# Patient Record
Sex: Female | Born: 1990 | Race: White | Hispanic: No | Smoking: Former smoker
Health system: Southern US, Community
[De-identification: ages and names within clinical notes are randomized; demographics above are authoritative.]

## PROBLEM LIST (undated history)

## (undated) DIAGNOSIS — J45909 Unspecified asthma, uncomplicated: Secondary | ICD-10-CM

## (undated) DIAGNOSIS — R51 Headache: Secondary | ICD-10-CM

## (undated) DIAGNOSIS — K219 Gastro-esophageal reflux disease without esophagitis: Secondary | ICD-10-CM

## (undated) DIAGNOSIS — R519 Headache, unspecified: Secondary | ICD-10-CM

## (undated) DIAGNOSIS — N809 Endometriosis, unspecified: Secondary | ICD-10-CM

## (undated) DIAGNOSIS — Z8719 Personal history of other diseases of the digestive system: Secondary | ICD-10-CM

## (undated) HISTORY — PX: TUBAL LIGATION: SHX77

## (undated) HISTORY — PX: KNEE ARTHROSCOPY: SHX127

## (undated) HISTORY — PX: OTHER SURGICAL HISTORY: SHX169

## (undated) HISTORY — DX: Gastro-esophageal reflux disease without esophagitis: K21.9

---
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
## 2008-03-04 ENCOUNTER — Emergency Department: Payer: Self-pay | Admitting: Emergency Medicine

## 2009-12-17 ENCOUNTER — Ambulatory Visit: Payer: Self-pay | Admitting: Advanced Practice Midwife

## 2010-01-08 ENCOUNTER — Observation Stay: Payer: Self-pay | Admitting: Obstetrics and Gynecology

## 2010-02-28 ENCOUNTER — Observation Stay: Payer: Self-pay

## 2010-03-22 ENCOUNTER — Ambulatory Visit: Payer: Self-pay | Admitting: Advanced Practice Midwife

## 2010-04-15 ENCOUNTER — Encounter: Payer: Self-pay | Admitting: Maternal & Fetal Medicine

## 2010-05-18 ENCOUNTER — Observation Stay: Payer: Self-pay

## 2010-05-21 ENCOUNTER — Inpatient Hospital Stay: Payer: Self-pay

## 2010-06-04 ENCOUNTER — Emergency Department: Payer: Self-pay | Admitting: Emergency Medicine

## 2010-07-15 ENCOUNTER — Ambulatory Visit: Payer: Self-pay | Admitting: Family Medicine

## 2011-06-11 ENCOUNTER — Encounter (HOSPITAL_COMMUNITY): Payer: Self-pay | Admitting: Emergency Medicine

## 2011-06-11 ENCOUNTER — Emergency Department (HOSPITAL_COMMUNITY)
Admission: EM | Admit: 2011-06-11 | Discharge: 2011-06-11 | Disposition: A | Discharge status: Home / Self Care | Payer: Self-pay | Attending: Emergency Medicine | Admitting: Emergency Medicine

## 2011-06-11 ENCOUNTER — Emergency Department (HOSPITAL_COMMUNITY): Payer: Self-pay

## 2011-06-11 DIAGNOSIS — G43909 Migraine, unspecified, not intractable, without status migrainosus: Secondary | ICD-10-CM | POA: Insufficient documentation

## 2011-06-11 DIAGNOSIS — F17200 Nicotine dependence, unspecified, uncomplicated: Secondary | ICD-10-CM | POA: Insufficient documentation

## 2011-06-11 DIAGNOSIS — R110 Nausea: Secondary | ICD-10-CM | POA: Insufficient documentation

## 2011-06-11 MED ORDER — METOCLOPRAMIDE HCL 5 MG/ML IJ SOLN
10.0000 mg | Freq: Once | INTRAMUSCULAR | Status: AC
  Administered 2011-06-11: 10 mg via INTRAVENOUS
  Filled 2011-06-11: qty 2

## 2011-06-11 MED ORDER — DIPHENHYDRAMINE HCL 50 MG/ML IJ SOLN
25.0000 mg | Freq: Once | INTRAMUSCULAR | Status: AC
  Administered 2011-06-11: 25 mg via INTRAVENOUS
  Filled 2011-06-11: qty 1

## 2011-06-11 MED ORDER — KETOROLAC TROMETHAMINE 30 MG/ML IJ SOLN
30.0000 mg | Freq: Once | INTRAMUSCULAR | Status: AC
  Administered 2011-06-11: 30 mg via INTRAVENOUS
  Filled 2011-06-11: qty 1

## 2011-06-11 MED ORDER — DEXAMETHASONE SODIUM PHOSPHATE 10 MG/ML IJ SOLN
10.0000 mg | Freq: Once | INTRAMUSCULAR | Status: AC
  Administered 2011-06-11: 10 mg via INTRAVENOUS
  Filled 2011-06-11: qty 1

## 2011-06-11 MED ORDER — SODIUM CHLORIDE 0.9 % IV BOLUS (SEPSIS)
1000.0000 mL | Freq: Once | INTRAVENOUS | Status: AC
  Administered 2011-06-11: 1000 mL via INTRAVENOUS

## 2011-06-11 NOTE — ED Notes (Signed)
Pt. Discharged to home via w/c with family, NAD noted

## 2011-06-11 NOTE — ED Notes (Signed)
PT. REPORTS HEADACHE WITH DIZZINESS AND NAUSEA FOR 2 WEEKS , DENIES INJURY , NO FEVER OR CHILLS.

## 2011-06-11 NOTE — ED Provider Notes (Signed)
History     CSN: [id number]  Arrival date & time 06/11/11  0015   First MD Initiated Contact with Patient 06/11/11 [phone]      Chief Complaint  Patient presents with  . Headache    (Consider location/radiation/quality/duration/timing/severity/associated sxs/prior treatment) HPI Patient is a 21 year old female who presents today complaining of 2 weeks of headache that she rates as a 10 out of 10. Patient describes this as being focused across her for head and sharp. She says her symptoms have been associated with nausea but no vomiting. She has no fevers or neck pain but does endorse dizziness. She's not had any trauma. Patient was seen earlier today at [facility] for this. She reports she was given an IM shot of morphine and then discharged. She had no improvement of her headache with this. Patient has had headaches like this previously but never this bad. She denies any numbness, tingling, or paresthesias.There are no other associated or modifying factors.  History reviewed. No pertinent past medical history.  Past Surgical History  Procedure Date  . Ankle arthroplasty   . Feet surgery     History reviewed. No pertinent family history.  History  Substance Use Topics  . Smoking status: Current Everyday Smoker  . Smokeless tobacco: Not on file  . Alcohol Use: No    OB History    Grav Para Term Preterm Abortions TAB SAB Ect Mult Living                  Review of Systems  Constitutional: Negative.   Eyes: Negative.   Respiratory: Negative.   Cardiovascular: Negative.   Gastrointestinal: Positive for nausea.  Genitourinary: Negative.   Musculoskeletal: Negative.   Skin: Negative.   Neurological: Positive for dizziness and headaches.  Hematological: Negative.   Psychiatric/Behavioral: Negative.   All other systems reviewed and are negative.    Allergies  Penicillins  Home Medications  No current outpatient prescriptions on file.  BP 102/52  Pulse 53   Temp(Src) 98.3 F (36.8 C) (Oral)  Resp 16  SpO2 100%  LMP 06/03/2011  Physical Exam GEN: Well-developed, well-nourished female in no distress but appears very uncomfortable. She lays with her eyes closed in a dark room. HEENT: Atraumatic, normocephalic. Oropharynx clear without erythema EYES: PERRLA BL, no scleral icterus. NECK: Trachea midline, no meningismus CV: regular rate and rhythm. No murmurs, rubs, or gallops PULM: No respiratory distress.  No crackles, wheezes, or rales. GI: soft, non-tender. No guarding, rebound, or tenderness. + bowel sounds  Neuro: cranial nerves 2-12 intact, no abnormalities of strength or sensation, A and O x 3, no difficulty with finger to nose task bilaterally. No pronator drift. Patient ambulate without difficulty. MSK: Patient moves all 4 extremities symmetrically, no deformity, edema, or injury noted Psych: no abnormality of mood  ED Course  Procedures (including critical care time)  Labs Reviewed - No data to display Ct Head Wo Contrast  06/11/2011  *RADIOLOGY REPORT*  Clinical Data: Headache, dizziness  CT HEAD WITHOUT CONTRAST  Technique:  Contiguous axial images were obtained from the base of the skull through the vertex without contrast.  Comparison: None.  Findings: There is no evidence for acute hemorrhage, hydrocephalus, mass lesion, or abnormal extra-axial fluid collection.  No definite CT evidence for acute infarction.  Partially opacified posterior ethmoid air cells bilaterally.  Otherwise, the visualized paranasal sinuses and mastoid air cells are predominately clear.  IMPRESSION: No acute intracranial abnormality.  Mild opacification of the posterior ethmoid air  cells.  Correlate clinically if concerned for early acute sinusitis.  Original Report Authenticated By: Waneta Martins, M.D.     1. Migraine       MDM  Patient was evaluated and did have symptoms concerning for migraine versus rebound headaches. Given her report of  dizziness and the persistence of the patient's symptoms CT head was performed. This was unremarkable aside from evidence of possible sinusitis. Patient has had this before but says her symptoms are not similar to this. She denied any facial pain or fevers. She denies any congestion as well. Patient was treated with migraine cocktail including Reglan and Benadryl 12 IV fluids. She had mild improvement in her pain at 8/10. Patient was then given Toradol. Her symptoms completely resolved. She was given a dose of Decadron to prevent rebound. Patient was discharged in good condition with instructions to return if she had any other emergent concerns.        Cyndra Numbers, MD 06/11/11 [phone]

## 2011-06-12 ENCOUNTER — Encounter (HOSPITAL_COMMUNITY): Payer: Self-pay | Admitting: Emergency Medicine

## 2011-06-12 ENCOUNTER — Emergency Department (HOSPITAL_COMMUNITY)
Admission: EM | Admit: 2011-06-12 | Discharge: 2011-06-12 | Disposition: A | Discharge status: Home / Self Care | Payer: Self-pay | Attending: Emergency Medicine | Admitting: Emergency Medicine

## 2011-06-12 DIAGNOSIS — H53149 Visual discomfort, unspecified: Secondary | ICD-10-CM | POA: Insufficient documentation

## 2011-06-12 DIAGNOSIS — R110 Nausea: Secondary | ICD-10-CM | POA: Insufficient documentation

## 2011-06-12 DIAGNOSIS — G43909 Migraine, unspecified, not intractable, without status migrainosus: Secondary | ICD-10-CM | POA: Insufficient documentation

## 2011-06-12 DIAGNOSIS — R209 Unspecified disturbances of skin sensation: Secondary | ICD-10-CM | POA: Insufficient documentation

## 2011-06-12 MED ORDER — SUMATRIPTAN SUCCINATE 100 MG PO TABS: 100.0000 mg | ORAL_TABLET | ORAL | Status: DC | PRN

## 2011-06-12 MED ORDER — SUMATRIPTAN SUCCINATE 6 MG/0.5ML ~~LOC~~ SOLN
6.0000 mg | Freq: Once | SUBCUTANEOUS | Status: AC
  Administered 2011-06-12: 6 mg via SUBCUTANEOUS
  Filled 2011-06-12 (×2): qty 0.5

## 2011-06-12 MED ORDER — PREDNISONE 50 MG PO TABS: 50.0000 mg | ORAL_TABLET | Freq: Every day | ORAL | Status: DC

## 2011-06-12 NOTE — ED Notes (Signed)
Pt c/o generalized HA x several days; pt sts was seen here for same several days ago; pt sts N/V and photophobia with pain

## 2011-06-12 NOTE — ED Provider Notes (Signed)
History     CSN: [id number]  Arrival date & time 06/12/11  1153   None     Chief Complaint  Patient presents with  . Headache     HPI Patient seen here yesterday with headache.  Workup at that time included negative CT scan.  Patient was discharged home after treatment and was improved.  Comes back in today for similar symptoms except now she has on again off again numbness to her left arm and left leg.  Patient has no known history of migraines that she is aware of.  She does complain of photophobia, nausea.  Mild noises appear to make the headache worse. History reviewed. No pertinent past medical history.  Past Surgical History  Procedure Date  . Ankle arthroplasty   . Feet surgery     History reviewed. No pertinent family history.  History  Substance Use Topics  . Smoking status: Current Everyday Smoker  . Smokeless tobacco: Not on file  . Alcohol Use: No    OB History    Grav Para Term Preterm Abortions TAB SAB Ect Mult Living                  Review of Systems Review of systems negative except as noted on history of present illness Allergies  Penicillins  Home Medications   Current Outpatient Rx  Name Route Sig Dispense Refill  . PREDNISONE 50 MG PO TABS Oral Take 1 tablet (50 mg total) by mouth daily. 5 tablet 0  . SUMATRIPTAN SUCCINATE 100 MG PO TABS Oral Take 1 tablet (100 mg total) by mouth every 2 (two) hours as needed for migraine. 10 tablet 0    BP 134/93  Pulse 70  Temp(Src) 98.1 F (36.7 C) (Oral)  Resp 16  SpO2 100%  LMP 06/03/2011  Physical Exam Physical Exam GEN: Well-developed, well-nourished female in no distress but appears very uncomfortable. She lays with her eyes closed in a dark room. HEENT: Atraumatic, normocephalic. Oropharynx clear without erythema EYES: PERRLA BL, no scleral icterus. NECK: Trachea midline, no meningismus CV: regular rate and rhythm. No murmurs, rubs, or gallops PULM: No respiratory distress.  No crackles,  wheezes, or rales. GI: soft, non-tender. No guarding, rebound, or tenderness. + bowel sounds  Neuro: cranial nerves 2-12 intact, no abnormalities of strength or sensation, A and O x 3, no difficulty with finger to nose task bilaterally. No pronator drift. Patient ambulate without difficulty. MSK: Patient moves all 4 extremities symmetrically, no deformity, edema, or injury noted Psych: no abnormality of mood ED Course  Procedures (including critical care time)  Labs Reviewed - No data to display Ct Head Wo Contrast  06/11/2011  *RADIOLOGY REPORT*  Clinical Data: Headache, dizziness  CT HEAD WITHOUT CONTRAST  Technique:  Contiguous axial images were obtained from the base of the skull through the vertex without contrast.  Comparison: None.  Findings: There is no evidence for acute hemorrhage, hydrocephalus, mass lesion, or abnormal extra-axial fluid collection.  No definite CT evidence for acute infarction.  Partially opacified posterior ethmoid air cells bilaterally.  Otherwise, the visualized paranasal sinuses and mastoid air cells are predominately clear.  IMPRESSION: No acute intracranial abnormality.  Mild opacification of the posterior ethmoid air cells.  Correlate clinically if concerned for early acute sinusitis.  Original Report Authenticated By: Waneta Martins, M.D.     1. Migraine       MDM  Patient given subcutaneous Imitrex and had significant relief from her headache.  Nelia Shi, MD 06/12/11 [phone]

## 2012-08-11 ENCOUNTER — Encounter (HOSPITAL_COMMUNITY): Payer: Self-pay | Admitting: Emergency Medicine

## 2012-08-11 ENCOUNTER — Emergency Department (HOSPITAL_COMMUNITY)
Admission: EM | Admit: 2012-08-11 | Discharge: 2012-08-12 | Disposition: A | Discharge status: Home / Self Care | Payer: BC Managed Care – PPO | Attending: Emergency Medicine | Admitting: Emergency Medicine

## 2012-08-11 DIAGNOSIS — R05 Cough: Secondary | ICD-10-CM | POA: Insufficient documentation

## 2012-08-11 DIAGNOSIS — R0982 Postnasal drip: Secondary | ICD-10-CM | POA: Insufficient documentation

## 2012-08-11 DIAGNOSIS — J069 Acute upper respiratory infection, unspecified: Secondary | ICD-10-CM | POA: Insufficient documentation

## 2012-08-11 DIAGNOSIS — R059 Cough, unspecified: Secondary | ICD-10-CM | POA: Insufficient documentation

## 2012-08-11 DIAGNOSIS — J9801 Acute bronchospasm: Secondary | ICD-10-CM | POA: Insufficient documentation

## 2012-08-11 DIAGNOSIS — IMO0001 Reserved for inherently not codable concepts without codable children: Secondary | ICD-10-CM | POA: Insufficient documentation

## 2012-08-11 DIAGNOSIS — F17200 Nicotine dependence, unspecified, uncomplicated: Secondary | ICD-10-CM | POA: Insufficient documentation

## 2012-08-11 DIAGNOSIS — R509 Fever, unspecified: Secondary | ICD-10-CM | POA: Insufficient documentation

## 2012-08-11 MED ORDER — DIPHENHYDRAMINE HCL 25 MG PO CAPS
25.0000 mg | ORAL_CAPSULE | Freq: Once | ORAL | Status: AC
  Administered 2012-08-12: 25 mg via ORAL
  Filled 2012-08-11: qty 1

## 2012-08-11 MED ORDER — ALBUTEROL SULFATE HFA 108 (90 BASE) MCG/ACT IN AERS
2.0000 | INHALATION_SPRAY | RESPIRATORY_TRACT | Status: DC | PRN
  Administered 2012-08-12: 2 via RESPIRATORY_TRACT
  Filled 2012-08-11: qty 6.7

## 2012-08-11 NOTE — ED Notes (Signed)
PT. REPORTS PRODUCTIVE COUGH , FEVER , CORYZA , CHILLS/BODY ACHES ONSET 4 DAYS AGO. STATES 5 1/2 MONTHS PREGNANT.

## 2012-08-11 NOTE — ED Provider Notes (Signed)
History     CSN: [id number]  Arrival date & time 08/11/12  2032   First MD Initiated Contact with Patient 08/11/12 2343      Chief Complaint  Patient presents with  . Nasal Congestion  . Cough    (Consider location/radiation/quality/duration/timing/severity/associated sxs/prior treatment) HPI Comments: PAtient is 5 months pregnant now with URI symptoms and cough  Low grade temp to 100 Has not sen her OB, nor taken any medications  Has had to use inhaler in the past for bronchospasm   Patient is a 22 y.o. female presenting with cough. The history is provided by the patient.  Cough Cough characteristics:  Non-productive and barking Severity:  Moderate Onset quality:  Gradual Timing:  Intermittent Chronicity:  New Smoker: yes   Relieved by:  None tried Worsened by:  Nothing tried Ineffective treatments:  None tried Associated symptoms: myalgias and rhinorrhea   Associated symptoms: no chills, no fever, no headaches, no shortness of breath and no wheezing     History reviewed. No pertinent past medical history.  Past Surgical History  Procedure Laterality Date  . Ankle arthroplasty    . Feet surgery      No family history on file.  History  Substance Use Topics  . Smoking status: Current Every Day Smoker  . Smokeless tobacco: Not on file  . Alcohol Use: No    OB History   Grav Para Term Preterm Abortions TAB SAB Ect Mult Living   1               Review of Systems  Constitutional: Negative for fever and chills.  HENT: Positive for rhinorrhea and postnasal drip.   Respiratory: Positive for cough. Negative for shortness of breath and wheezing.   Gastrointestinal: Negative for nausea and vomiting.  Genitourinary: Negative for dysuria.  Musculoskeletal: Positive for myalgias.  Skin: Negative for pallor.  Neurological: Negative for dizziness and headaches.  All other systems reviewed and are negative.    Allergies  Penicillins  Home Medications   Current  Outpatient Rx  Name  Route  Sig  Dispense  Refill  . acetaminophen (TYLENOL) 325 MG tablet   Oral   Take 650 mg by mouth every 6 (six) hours as needed for pain or fever.         Marland Kitchen EXPIRED: SUMAtriptan (IMITREX) 100 MG tablet   Oral   Take 1 tablet (100 mg total) by mouth every 2 (two) hours as needed for migraine.   10 tablet   0     BP 122/73  Pulse 101  Temp(Src) 99.8 F (37.7 C) (Oral)  Resp 14  SpO2 100%  Physical Exam  Constitutional: She appears well-developed.  HENT:  Head: Normocephalic.  Mouth/Throat: Oropharynx is clear and moist.  Eyes: Pupils are equal, round, and reactive to light.  Neck: Normal range of motion.  Cardiovascular: Regular rhythm.  Tachycardia present.   Pulmonary/Chest: Effort normal and breath sounds normal. No respiratory distress. She has no wheezes. She exhibits tenderness.  Abdominal: Soft.  Musculoskeletal: Normal range of motion.  Neurological: She is alert.  Skin: Skin is warm.    ED Course  Procedures (including critical care time)  Labs Reviewed - No data to display No results found.   1. URI, acute   2. Bronchospasm       MDM   fetal heart tones 160        Arman Filter, NP 08/12/12 [phone]

## 2012-08-12 NOTE — ED Notes (Signed)
Strong fetal heart rate 160 below umbilicus

## 2012-08-14 NOTE — ED Provider Notes (Signed)
Medical screening examination/treatment/procedure(s) were performed by non-physician practitioner and as supervising physician I was immediately available for consultation/collaboration.    Celene Kras, MD 08/14/12 [phone]

## 2012-11-10 ENCOUNTER — Observation Stay: Payer: Self-pay

## 2012-12-02 ENCOUNTER — Inpatient Hospital Stay: Payer: Self-pay | Admitting: Obstetrics and Gynecology

## 2012-12-02 LAB — CBC WITH DIFFERENTIAL/PLATELET
Basophil #: 0.1 10*3/uL (ref 0.0–0.1)
Eosinophil #: 0.4 10*3/uL (ref 0.0–0.7)
HCT: 33.8 % — ABNORMAL LOW (ref 35.0–47.0)
HGB: 11.9 g/dL — ABNORMAL LOW (ref 12.0–16.0)
Lymphocyte #: 2.2 10*3/uL (ref 1.0–3.6)
MCH: 30.6 pg (ref 26.0–34.0)
Monocyte #: 0.7 x10 3/mm (ref 0.2–0.9)
Monocyte %: 5.5 %
Platelet: 317 10*3/uL (ref 150–440)
RBC: 3.87 10*6/uL (ref 3.80–5.20)
WBC: 12.1 10*3/uL — ABNORMAL HIGH (ref 3.6–11.0)

## 2012-12-03 LAB — GC/CHLAMYDIA PROBE AMP

## 2012-12-04 LAB — HEMATOCRIT: HCT: 32.3 % — ABNORMAL LOW (ref 35.0–47.0)

## 2012-12-07 LAB — PATHOLOGY REPORT

## 2013-05-08 ENCOUNTER — Emergency Department (HOSPITAL_COMMUNITY)
Admission: EM | Admit: 2013-05-08 | Discharge: 2013-05-08 | Disposition: A | Discharge status: Home / Self Care | Payer: Worker's Compensation | Attending: Emergency Medicine | Admitting: Emergency Medicine

## 2013-05-08 ENCOUNTER — Encounter (HOSPITAL_COMMUNITY): Payer: Self-pay | Admitting: Emergency Medicine

## 2013-05-08 DIAGNOSIS — Z880 Allergy status to penicillin: Secondary | ICD-10-CM | POA: Insufficient documentation

## 2013-05-08 DIAGNOSIS — M5416 Radiculopathy, lumbar region: Secondary | ICD-10-CM

## 2013-05-08 DIAGNOSIS — F17200 Nicotine dependence, unspecified, uncomplicated: Secondary | ICD-10-CM | POA: Insufficient documentation

## 2013-05-08 DIAGNOSIS — IMO0002 Reserved for concepts with insufficient information to code with codable children: Secondary | ICD-10-CM | POA: Insufficient documentation

## 2013-05-08 DIAGNOSIS — M545 Low back pain, unspecified: Secondary | ICD-10-CM | POA: Insufficient documentation

## 2013-05-08 MED ORDER — CYCLOBENZAPRINE HCL 10 MG PO TABS
10.0000 mg | ORAL_TABLET | Freq: Once | ORAL | Status: AC
  Administered 2013-05-08: 10 mg via ORAL
  Filled 2013-05-08: qty 1

## 2013-05-08 MED ORDER — CYCLOBENZAPRINE HCL 10 MG PO TABS: 10.0000 mg | ORAL_TABLET | Freq: Three times a day (TID) | ORAL | Status: DC | PRN

## 2013-05-08 MED ORDER — HYDROCODONE-ACETAMINOPHEN 5-325 MG PO TABS: 1.0000 | ORAL_TABLET | ORAL | Status: DC | PRN

## 2013-05-08 MED ORDER — IBUPROFEN 800 MG PO TABS: 800.0000 mg | ORAL_TABLET | Freq: Three times a day (TID) | ORAL | Status: DC | PRN

## 2013-05-08 NOTE — ED Notes (Signed)
Pt reports injury to lower back at work this morning.

## 2013-05-08 NOTE — Discharge Instructions (Signed)
Read the information below.  Use the prescribed medication as directed.  Please discuss all new medications with your pharmacist.  Do not take additional tylenol while taking the prescribed pain medication to avoid overdose.  You may return to the Emergency Department at any time for worsening condition or any new symptoms that concern you.   If you develop fevers, loss of control of bowel or bladder, weakness or numbness in your legs, or are unable to walk, return to the ER for a recheck.  ° ° °Back Pain, Adult °Low back pain is very common. About 1 in 5 people have back pain. The cause of low back pain is rarely dangerous. The pain often gets better over time. About half of people with a sudden onset of back pain feel better in just 2 weeks. About 8 in 10 people feel better by 6 weeks.  °CAUSES °Some common causes of back pain include: °· Strain of the muscles or ligaments supporting the spine. °· Wear and tear (degeneration) of the spinal discs. °· Arthritis. °· Direct injury to the back. °DIAGNOSIS °Most of the time, the direct cause of low back pain is not known. However, back pain can be treated effectively even when the exact cause of the pain is unknown. Answering your caregiver's questions about your overall health and symptoms is one of the most accurate ways to make sure the cause of your pain is not dangerous. If your caregiver needs more information, he or she may order lab work or imaging tests (X-rays or MRIs). However, even if imaging tests show changes in your back, this usually does not require surgery. °HOME CARE INSTRUCTIONS °For many people, back pain returns. Since low back pain is rarely dangerous, it is often a condition that people can learn to manage on their own.  °· Remain active. It is stressful on the back to sit or stand in one place. Do not sit, drive, or stand in one place for more than 30 minutes at a time. Take short walks on level surfaces as soon as pain allows. Try to increase  the length of time you walk each day. °· Do not stay in bed. Resting more than 1 or 2 days can delay your recovery. °· Do not avoid exercise or work. Your body is made to move. It is not dangerous to be active, even though your back may hurt. Your back will likely heal faster if you return to being active before your pain is gone. °· Pay attention to your body when you  bend and lift. Many people have less discomfort when lifting if they bend their knees, keep the load close to their bodies, and avoid twisting. Often, the most comfortable positions are those that put less stress on your recovering back. °· Find a comfortable position to sleep. Use a firm mattress and lie on your side with your knees slightly bent. If you lie on your back, put a pillow under your knees. °· Only take over-the-counter or prescription medicines as directed by your caregiver. Over-the-counter medicines to reduce pain and inflammation are often the most helpful. Your caregiver may prescribe muscle relaxant drugs. These medicines help dull your pain so you can more quickly return to your normal activities and healthy exercise. °· Put ice on the injured area. °· Put ice in a plastic bag. °· Place a towel between your skin and the bag. °· Leave the ice on for 15-20 minutes, 03-04 times a day for the first 2 to 3 days. After that, ice and heat may be alternated to reduce pain and spasms. °·   Ask your caregiver about trying back exercises and gentle massage. This may be of some benefit. °· Avoid feeling anxious or stressed. Stress increases muscle tension and can worsen back pain. It is important to recognize when you are anxious or stressed and learn ways to manage it. Exercise is a great option. °SEEK MEDICAL CARE IF: °· You have pain that is not relieved with rest or medicine. °· You have pain that does not improve in 1 week. °· You have new symptoms. °· You are generally not feeling well. °SEEK IMMEDIATE MEDICAL CARE IF:  °· You have pain  that radiates from your back into your legs. °· You develop new bowel or bladder control problems. °· You have unusual weakness or numbness in your arms or legs. °· You develop nausea or vomiting. °· You develop abdominal pain. °· You feel faint. °Document Released: 04/21/2005 Document Revised: 10/21/2011 Document Reviewed: 09/09/2010 °[employer]® Patient Information ©2014 [employer]. ° °

## 2013-05-08 NOTE — ED Provider Notes (Signed)
CSN: [id number]     Arrival date & time 05/08/13  2055 History  This chart was scribed for non-physician practitioner, Trixie Dredge, PA-C,working with Doug Sou, MD, by Karle Plumber, ED Scribe.  This patient was seen in room TR09C/TR09C and the patient's care was started at 10:21 PM.  Chief Complaint  Patient presents with  . Back Pain   The history is provided by the patient. No language interpreter was used.   HPI Comments:  Melissa Mcmahon is a 23 y.o. female who presents to the Emergency Department complaining of lower back pain secondary to injuring it at work earlier this morning. She states she was lifting a 50 pound box over her head when she started feeling the pain. Pt reports the pain is across her back and radiates down her right leg half way down her thigh. She reports the pain as 4/10 at rest and 8/10 with movement. She denies weakness, numbness, numbness in groin, fever, vomiting, abdominal pain, urinary problem, vaginal discharge or bleeding, diarrhea, hematochezia, or constipation. She reports her LMP was approximately one month ago. She has been ambulatory since the incident. She states she does not have a PCP.   History reviewed. No pertinent past medical history. Past Surgical History  Procedure Laterality Date  . Ankle arthroplasty    . Feet surgery     No family history on file. History  Substance Use Topics  . Smoking status: Current Every Day Smoker  . Smokeless tobacco: Not on file  . Alcohol Use: No   OB History   Grav Para Term Preterm Abortions TAB SAB Ect Mult Living   1              Review of Systems  Constitutional: Negative for fever.  Gastrointestinal: Negative for nausea, vomiting, abdominal pain, diarrhea and blood in stool.  Genitourinary: Negative for dysuria, urgency, frequency, vaginal bleeding, vaginal discharge and menstrual problem.  Musculoskeletal: Positive for back pain. Negative for gait problem.  Neurological: Negative for  weakness and numbness.    Allergies  Penicillins  Home Medications   Current Outpatient Rx  Name  Route  Sig  Dispense  Refill  . acetaminophen (TYLENOL) 325 MG tablet   Oral   Take 650 mg by mouth every 6 (six) hours as needed for pain or fever.         Marland Kitchen EXPIRED: SUMAtriptan (IMITREX) 100 MG tablet   Oral   Take 1 tablet (100 mg total) by mouth every 2 (two) hours as needed for migraine.   10 tablet   0    Triage Vitals: BP 125/77  Pulse 84  Temp(Src) 98.2 F (36.8 C) (Oral)  Resp 16  SpO2 100%  LMP 04/08/2013  Breastfeeding? Unknown Physical Exam  Nursing note and vitals reviewed. Constitutional: She appears well-developed and well-nourished. No distress.  HENT:  Head: Normocephalic and atraumatic.  Neck: Neck supple.  Pulmonary/Chest: Effort normal.  Musculoskeletal: She exhibits tenderness.       Arms: Lower extremities:  Strength 5/5, sensation intact, distal pulses intact.    Straight leg raise negative  Neurological: She is alert.  Skin: She is not diaphoretic.  Lower extremities:  Strength 5/5, sensation intact, distal pulses intact.    ED Course  Procedures (including critical care time) DIAGNOSTIC STUDIES: Oxygen Saturation is 100% on RA, normal by my interpretation.   COORDINATION OF CARE: 10:29 PM- Will give pt resource guide of primary care providers. Will prescribe pain medications and provide work note.  Will give pain medication prior to discharge. Pt verbalizes understanding and agrees to plan.  Medications - No data to display  Labs Review Labs Reviewed - No data to display Imaging Review No results found.  EKG Interpretation   None       MDM   1. Low back pain   2. Lumbar radiculopathy    Pt with injury to lower back while performing heavy lifting at work.  Pain is located across lower back and radiates down right leg.  Neurovascularly intact.  No fall, no direct trauma to the spine.  Tenderness to palpation over lumbar area,  worse on the left; however, right leg with radicular symptoms.   No need for emergent imaging at this time.  Pt does need to follow up with PCP or occupational health.  Discussed findings, treatment, and follow up  with patient.  Pt given return precautions.  Pt verbalizes understanding and agrees with plan.      I personally performed the services described in this documentation, which was scribed in my presence. The recorded information has been reviewed and is accurate.     Trixie Dredgemily Vanette Noguchi, PA-C 05/08/13 2316

## 2013-05-09 NOTE — ED Provider Notes (Signed)
Medical screening examination/treatment/procedure(s) were performed by non-physician practitioner and as supervising physician I was immediately available for consultation/collaboration.  EKG Interpretation   None        Jesus Poplin, MD 05/09/13 0026 

## 2013-08-23 ENCOUNTER — Emergency Department (HOSPITAL_COMMUNITY): Payer: BC Managed Care – PPO

## 2013-08-23 ENCOUNTER — Emergency Department (HOSPITAL_COMMUNITY)
Admission: EM | Admit: 2013-08-23 | Discharge: 2013-08-23 | Disposition: A | Discharge status: Home / Self Care | Payer: Self-pay | Attending: Emergency Medicine | Admitting: Emergency Medicine

## 2013-08-23 ENCOUNTER — Encounter (HOSPITAL_COMMUNITY): Payer: Self-pay | Admitting: Emergency Medicine

## 2013-08-23 DIAGNOSIS — Z880 Allergy status to penicillin: Secondary | ICD-10-CM | POA: Insufficient documentation

## 2013-08-23 DIAGNOSIS — Z3202 Encounter for pregnancy test, result negative: Secondary | ICD-10-CM | POA: Insufficient documentation

## 2013-08-23 DIAGNOSIS — M549 Dorsalgia, unspecified: Secondary | ICD-10-CM

## 2013-08-23 DIAGNOSIS — M545 Low back pain, unspecified: Secondary | ICD-10-CM | POA: Insufficient documentation

## 2013-08-23 DIAGNOSIS — R109 Unspecified abdominal pain: Secondary | ICD-10-CM | POA: Insufficient documentation

## 2013-08-23 DIAGNOSIS — F17200 Nicotine dependence, unspecified, uncomplicated: Secondary | ICD-10-CM | POA: Insufficient documentation

## 2013-08-23 DIAGNOSIS — R110 Nausea: Secondary | ICD-10-CM | POA: Insufficient documentation

## 2013-08-23 LAB — URINALYSIS, ROUTINE W REFLEX MICROSCOPIC
Bilirubin Urine: NEGATIVE
Glucose, UA: NEGATIVE mg/dL
Ketones, ur: 15 mg/dL — AB
Nitrite: NEGATIVE
Protein, ur: 30 mg/dL — AB
Specific Gravity, Urine: 1.028 (ref 1.005–1.030)
Urobilinogen, UA: 1 mg/dL (ref 0.0–1.0)
pH: 7.5 (ref 5.0–8.0)

## 2013-08-23 LAB — BASIC METABOLIC PANEL
BUN: 15 mg/dL (ref 6–23)
CALCIUM: 9.2 mg/dL (ref 8.4–10.5)
CHLORIDE: 106 meq/L (ref 96–112)
CO2: 25 meq/L (ref 19–32)
CREATININE: 0.93 mg/dL (ref 0.50–1.10)
GFR calc Af Amer: >90 mL/min (ref >90)
GFR calc non Af Amer: 85 mL/min — ABNORMAL LOW (ref >90)
GLUCOSE: 79 mg/dL (ref 70–99)
Potassium: 3.9 mEq/L (ref 3.7–5.3)
Sodium: 141 mEq/L (ref 137–147)

## 2013-08-23 LAB — URINE MICROSCOPIC-ADD ON

## 2013-08-23 LAB — CBC WITH DIFFERENTIAL/PLATELET
Basophils Absolute: 0.1 10*3/uL (ref 0.0–0.1)
Basophils Relative: 1 % (ref 0–1)
EOS PCT: 8 % — AB (ref 0–5)
Eosinophils Absolute: 0.6 10*3/uL (ref 0.0–0.7)
HEMATOCRIT: 40.4 % (ref 36.0–46.0)
HEMOGLOBIN: 13.6 g/dL (ref 12.0–15.0)
LYMPHS ABS: 2.3 10*3/uL (ref 0.7–4.0)
LYMPHS PCT: 30 % (ref 12–46)
MCH: 30.2 pg (ref 26.0–34.0)
MCHC: 33.7 g/dL (ref 30.0–36.0)
MCV: 89.8 fL (ref 78.0–100.0)
MONO ABS: 0.7 10*3/uL (ref 0.1–1.0)
Monocytes Relative: 9 % (ref 3–12)
NEUTROS ABS: 4 10*3/uL (ref 1.7–7.7)
Neutrophils Relative %: 52 % (ref 43–77)
Platelets: 253 10*3/uL (ref 150–400)
RBC: 4.5 MIL/uL (ref 3.87–5.11)
RDW: 12.8 % (ref 11.5–15.5)
WBC: 7.7 10*3/uL (ref 4.0–10.5)

## 2013-08-23 LAB — POC URINE PREG, ED: Preg Test, Ur: NEGATIVE

## 2013-08-23 MED ORDER — CYCLOBENZAPRINE HCL 10 MG PO TABS: 10.0000 mg | ORAL_TABLET | Freq: Two times a day (BID) | ORAL | Status: DC | PRN

## 2013-08-23 MED ORDER — MECLIZINE HCL 25 MG PO TABS
25.0000 mg | ORAL_TABLET | Freq: Once | ORAL | Status: DC
  Filled 2013-08-23: qty 1

## 2013-08-23 MED ORDER — MORPHINE SULFATE 4 MG/ML IJ SOLN
4.0000 mg | Freq: Once | INTRAMUSCULAR | Status: AC
  Administered 2013-08-23: 4 mg via INTRAVENOUS
  Filled 2013-08-23: qty 1

## 2013-08-23 MED ORDER — SODIUM CHLORIDE 0.9 % IV BOLUS (SEPSIS)
1000.0000 mL | Freq: Once | INTRAVENOUS | Status: AC
  Administered 2013-08-23: 1000 mL via INTRAVENOUS

## 2013-08-23 MED ORDER — ONDANSETRON HCL 4 MG/2ML IJ SOLN
4.0000 mg | Freq: Once | INTRAMUSCULAR | Status: AC
  Administered 2013-08-23: 4 mg via INTRAVENOUS
  Filled 2013-08-23: qty 2

## 2013-08-23 MED ORDER — ONDANSETRON HCL 4 MG PO TABS: 4.0000 mg | ORAL_TABLET | Freq: Four times a day (QID) | ORAL | Status: DC

## 2013-08-23 MED ORDER — KETOROLAC TROMETHAMINE 30 MG/ML IJ SOLN
30.0000 mg | Freq: Once | INTRAMUSCULAR | Status: AC
  Administered 2013-08-23: 30 mg via INTRAVENOUS
  Filled 2013-08-23: qty 1

## 2013-08-23 MED ORDER — IOHEXOL 300 MG/ML  SOLN
100.0000 mL | Freq: Once | INTRAMUSCULAR | Status: AC | PRN
  Administered 2013-08-23: 100 mL via INTRAVENOUS

## 2013-08-23 MED ORDER — HYDROCODONE-ACETAMINOPHEN 5-325 MG PO TABS: 1.0000 | ORAL_TABLET | ORAL | Status: DC | PRN

## 2013-08-23 MED ORDER — PREDNISONE 10 MG PO TABS: 40.0000 mg | ORAL_TABLET | Freq: Every day | ORAL | Status: DC

## 2013-08-23 NOTE — ED Notes (Addendum)
Per pt sts right sided lower back, flank , and lower abdominal pain. sts some nausea. Denies urinary symptoms. Pt currently on menstrual cycle

## 2013-08-23 NOTE — ED Notes (Signed)
Patient transported to CT 

## 2013-08-23 NOTE — ED Provider Notes (Signed)
Medical screening examination/treatment/procedure(s) were performed by non-physician practitioner and as supervising physician I was immediately available for consultation/collaboration.   EKG Interpretation None        Gavin PoundMichael Y. Oletta LamasGhim, MD 08/23/13 [id number]

## 2013-08-23 NOTE — ED Provider Notes (Signed)
CSN: [id number]     Arrival date & time 08/23/13  1524 History   First MD Initiated Contact with Patient 08/23/13 1711     Chief Complaint  Patient presents with  . Flank Pain  . Back Pain     (Consider location/radiation/quality/duration/timing/severity/associated sxs/prior Treatment) HPI  Melissa Mcmahon is a 23 y.o.female without any significant PMH presents to the ER with complaints of right low back and abdominal pain that started on Saturday. She has had some associated nausea. She reports having a hx of cyst when she was pregnant. She reports currently being on her menstrual cycle. She denies having any abnormal vaginal discharge or pelvic pain. She denies urinary symptoms. She reports the pain gets worse with movement   History reviewed. No pertinent past medical history. Past Surgical History  Procedure Laterality Date  . Ankle arthroplasty    . Feet surgery     History reviewed. No pertinent family history. History  Substance Use Topics  . Smoking status: Current Every Day Smoker  . Smokeless tobacco: Not on file  . Alcohol Use: No   OB History   Grav Para Term Preterm Abortions TAB SAB Ect Mult Living   1              Review of Systems   Review of Systems  Gen: no weight loss, fevers, chills, night sweats  Eyes: no discharge or drainage, no occular pain or visual changes  Nose: no epistaxis or rhinorrhea  Mouth: no dental pain, no sore throat  Neck: no neck pain  Lungs:No wheezing, coughing or hemoptysis CV: no chest pain, palpitations, dependent edema or orthopnea  Abd: + abdominal pain, flank pain, nausea,  No vomiting, diarrhea GU: no dysuria or gross hematuria  MSK:  No muscle weakness or pain Neuro: no headache, no focal neurologic deficits  Skin: no rash or wounds Psyche: no complaints    Allergies  Penicillins  Home Medications   Prior to Admission medications   Medication Sig Start Date End Date Taking? Authorizing Provider   cyclobenzaprine (FLEXERIL) 10 MG tablet Take 1 tablet (10 mg total) by mouth 3 (three) times daily as needed for muscle spasms. 05/08/13   Trixie DredgeEmily West, PA-C  HYDROcodone-acetaminophen (NORCO/VICODIN) 5-325 MG per tablet Take 1 tablet by mouth every 4 (four) hours as needed. 05/08/13   Trixie DredgeEmily West, PA-C  ibuprofen (ADVIL,MOTRIN) 800 MG tablet Take 1 tablet (800 mg total) by mouth every 8 (eight) hours as needed for mild pain or moderate pain. 05/08/13   Trixie DredgeEmily West, PA-C   BP 128/86  Pulse 57  Temp(Src) 99.1 F (37.3 C) (Oral)  Resp 20  Wt 177 lb 7 oz (80.485 kg)  SpO2 100%  LMP 08/22/2013 Physical Exam  Nursing note and vitals reviewed. Constitutional: She appears well-developed and well-nourished. No distress.  HENT:  Head: Normocephalic and atraumatic.  Eyes: Pupils are equal, round, and reactive to light.  Neck: Normal range of motion. Neck supple.  Cardiovascular: Normal rate and regular rhythm.   Pulmonary/Chest: Effort normal.  Abdominal: Soft. There is tenderness (mild right flank, suprapubic and RLQ pain). There is rigidity. There is no guarding.  Musculoskeletal:  Pt has equal strength to bilateral lower extremities.  Neurosensory function adequate to both legs Skin color is normal. Skin is warm and moist.  I see no step off deformity, no midline bony tenderness.  Pt is able to ambulate.  No crepitus, laceration, effusion, induration, lesions, swelling.   Pedal pulses are symmetrical and  palpable bilaterally  No tenderness to palpation of lumbar paraspinel muscles   Neurological: She is alert.  Skin: Skin is warm and dry.    ED Course  Procedures (including critical care time) Labs Review Labs Reviewed  URINALYSIS, ROUTINE W REFLEX MICROSCOPIC - Abnormal; Notable for the following:    Color, Urine RED (*)    APPearance TURBID (*)    Hgb urine dipstick LARGE (*)    Ketones, ur 15 (*)    Protein, ur 30 (*)    Leukocytes, UA SMALL (*)    All other components within  normal limits  URINE MICROSCOPIC-ADD ON - Abnormal; Notable for the following:    Squamous Epithelial / LPF MANY (*)    All other components within normal limits  CBC WITH DIFFERENTIAL - Abnormal; Notable for the following:    Eosinophils Relative 8 (*)    All other components within normal limits  BASIC METABOLIC PANEL - Abnormal; Notable for the following:    GFR calc non Af Amer 85 (*)    All other components within normal limits  URINE CULTURE  POC URINE PREG, ED    Imaging Review Ct Abdomen Pelvis W Contrast  08/23/2013   CLINICAL DATA:  Right lower quadrant pain.  EXAM: CT ABDOMEN AND PELVIS WITH CONTRAST  TECHNIQUE: Multidetector CT imaging of the abdomen and pelvis was performed using the standard protocol following bolus administration of intravenous contrast.  CONTRAST:  100 mL OMNIPAQUE IOHEXOL 300 MG/ML  SOLN  COMPARISON:  None.  FINDINGS: The lung bases demonstrate only mild dependent atelectatic change. No pleural or pericardial effusion.  A few tiny stones layering dependently within the gallbladder versus posterior gallbladder wall calcification are seen. Tiny stones favored. No CT evidence of cholecystitis is identified. The liver, spleen, adrenal glands, pancreas and kidneys appear normal. The appendix is well visualized and normal. The stomach and small and large bowel appear normal. No lymphadenopathy or fluid. Uterus, adnexa and urinary bladder are unremarkable. No focal bony abnormality is identified. Small bone island in the right femoral head is incidentally noted.  IMPRESSION: Negative for appendicitis.  No acute finding.  Likely tiny gallstones versus gallbladder wall calcification. No CT evidence of cholecystitis is present.   Electronically Signed   By: Drusilla Kanner M.D.   On: 08/23/2013 19:25     EKG Interpretation None      MDM   Final diagnoses:  Back pain    The patients symptoms appear to be more musculoskeletal except for that she has been having  nausea and some right flank/abdominal pain which is concerning for possible intraabdominal etiology. Urine culture sent out.  The CT scan  Of her abdomen and her blood work is reassuring at this time. She is currently menstruating which is the cause of the hgb in her urine. I suspected musculoskeletal etiology. Will treat with steroids, pain medication, muscle relaxers and nausea medication.  22 y.o.Deborra Phegley Hoyos's evaluation in the Emergency Department is complete. It has been determined that no acute conditions requiring further emergency intervention are present at this time. The patient/guardian have been advised of the diagnosis and plan. We have discussed signs and symptoms that warrant return to the ED, such as changes or worsening in symptoms.  Vital signs are stable at discharge. Filed Vitals:   08/23/13 1930  BP: 128/86  Pulse: 57  Temp:   Resp:     Patient/guardian has voiced understanding and agreed to follow-up with the PCP or specialist.  Dorthula Matasiffany G Taite Baldassari, PA-C 08/23/13 1956

## 2013-08-23 NOTE — Discharge Instructions (Signed)
Lumbosacral Strain Lumbosacral strain is a strain of any of the parts that make up your lumbosacral vertebrae. Your lumbosacral vertebrae are the bones that make up the lower third of your backbone. Your lumbosacral vertebrae are held together by muscles and tough, fibrous tissue (ligaments).  CAUSES  A sudden blow to your back can cause lumbosacral strain. Also, anything that causes an excessive stretch of the muscles in the low back can cause this strain. This is typically seen when people exert themselves strenuously, fall, lift heavy objects, bend, or crouch repeatedly. RISK FACTORS  Physically demanding work.  Participation in pushing or pulling sports or sports that require sudden twist of the back (tennis, golf, baseball).  Weight lifting.  Excessive lower back curvature.  Forward-tilted pelvis.  Weak back or abdominal muscles or both.  Tight hamstrings. SIGNS AND SYMPTOMS  Lumbosacral strain may cause pain in the area of your injury or pain that moves (radiates) down your leg.  DIAGNOSIS Your health care provider can often diagnose lumbosacral strain through a physical exam. In some cases, you may need tests such as X-ray exams.  TREATMENT  Treatment for your lower back injury depends on many factors that your clinician will have to evaluate. However, most treatment will include the use of anti-inflammatory medicines. HOME CARE INSTRUCTIONS   Avoid hard physical activities (tennis, racquetball, waterskiing) if you are not in proper physical condition for it. This may aggravate or create problems.  If you have a back problem, avoid sports requiring sudden body movements. Swimming and walking are generally safer activities.  Maintain good posture.  Maintain a healthy weight.  For acute conditions, you may put ice on the injured area.  Put ice in a plastic bag.  Place a towel between your skin and the bag.  Leave the ice on for 20 minutes, 2 3 times a day.  When the  low back starts healing, stretching and strengthening exercises may be recommended. SEEK MEDICAL CARE IF:  Your back pain is getting worse.  You experience severe back pain not relieved with medicines. SEEK IMMEDIATE MEDICAL CARE IF:   You have numbness, tingling, weakness, or problems with the use of your arms or legs.  There is a change in bowel or bladder control.  You have increasing pain in any area of the body, including your belly (abdomen).  You notice shortness of breath, dizziness, or feel faint.  You feel sick to your stomach (nauseous), are throwing up (vomiting), or become sweaty.  You notice discoloration of your toes or legs, or your feet get very cold. MAKE SURE YOU:   Understand these instructions.  Will watch your condition.  Will get help right away if you are not doing well or get worse. Document Released: 01/29/2005 Document Revised: 02/09/2013 Document Reviewed: 12/08/2012 [employer] Patient Information 2014 [employer].  

## 2013-08-24 LAB — URINE CULTURE
COLONY COUNT: NO GROWTH
Culture: NO GROWTH

## 2014-03-06 ENCOUNTER — Encounter (HOSPITAL_COMMUNITY): Payer: Self-pay | Admitting: Emergency Medicine

## 2014-07-09 ENCOUNTER — Emergency Department (HOSPITAL_COMMUNITY)
Admission: EM | Admit: 2014-07-09 | Discharge: 2014-07-10 | Disposition: A | Discharge status: Home / Self Care | Payer: Self-pay | Attending: Emergency Medicine | Admitting: Emergency Medicine

## 2014-07-09 ENCOUNTER — Encounter (HOSPITAL_COMMUNITY): Payer: Self-pay | Admitting: *Deleted

## 2014-07-09 ENCOUNTER — Emergency Department (HOSPITAL_COMMUNITY): Payer: Self-pay

## 2014-07-09 DIAGNOSIS — Z880 Allergy status to penicillin: Secondary | ICD-10-CM | POA: Insufficient documentation

## 2014-07-09 DIAGNOSIS — R1031 Right lower quadrant pain: Secondary | ICD-10-CM | POA: Insufficient documentation

## 2014-07-09 DIAGNOSIS — Z3202 Encounter for pregnancy test, result negative: Secondary | ICD-10-CM | POA: Insufficient documentation

## 2014-07-09 DIAGNOSIS — R112 Nausea with vomiting, unspecified: Secondary | ICD-10-CM | POA: Insufficient documentation

## 2014-07-09 DIAGNOSIS — Z720 Tobacco use: Secondary | ICD-10-CM | POA: Insufficient documentation

## 2014-07-09 DIAGNOSIS — Z7952 Long term (current) use of systemic steroids: Secondary | ICD-10-CM | POA: Insufficient documentation

## 2014-07-09 DIAGNOSIS — R109 Unspecified abdominal pain: Secondary | ICD-10-CM

## 2014-07-09 DIAGNOSIS — N939 Abnormal uterine and vaginal bleeding, unspecified: Secondary | ICD-10-CM | POA: Insufficient documentation

## 2014-07-09 LAB — POC URINE PREG, ED: Preg Test, Ur: NEGATIVE

## 2014-07-09 LAB — COMPREHENSIVE METABOLIC PANEL
ALBUMIN: 3.8 g/dL (ref 3.5–5.2)
ALT: 37 U/L — ABNORMAL HIGH (ref 0–35)
ANION GAP: 7 (ref 5–15)
AST: 35 U/L (ref 0–37)
Alkaline Phosphatase: 95 U/L (ref 39–117)
BUN: 8 mg/dL (ref 6–23)
CALCIUM: 9.1 mg/dL (ref 8.4–10.5)
CO2: 24 mmol/L (ref 19–32)
Chloride: 108 mmol/L (ref 96–112)
Creatinine, Ser: 0.58 mg/dL (ref 0.50–1.10)
GFR calc Af Amer: >90 mL/min (ref >90)
GLUCOSE: 96 mg/dL (ref 70–99)
Potassium: 4 mmol/L (ref 3.5–5.1)
Sodium: 139 mmol/L (ref 135–145)
Total Bilirubin: 0.2 mg/dL — ABNORMAL LOW (ref 0.3–1.2)
Total Protein: 6.1 g/dL (ref 6.0–8.3)

## 2014-07-09 LAB — URINALYSIS, ROUTINE W REFLEX MICROSCOPIC
Bilirubin Urine: NEGATIVE
Glucose, UA: NEGATIVE mg/dL
Ketones, ur: NEGATIVE mg/dL
LEUKOCYTES UA: NEGATIVE
NITRITE: NEGATIVE
Protein, ur: NEGATIVE mg/dL
Specific Gravity, Urine: 1.026 (ref 1.005–1.030)
UROBILINOGEN UA: 1 mg/dL (ref 0.0–1.0)
pH: 7 (ref 5.0–8.0)

## 2014-07-09 LAB — CBC WITH DIFFERENTIAL/PLATELET
Basophils Absolute: 0.1 10*3/uL (ref 0.0–0.1)
Basophils Relative: 1 % (ref 0–1)
EOS ABS: 0.6 10*3/uL (ref 0.0–0.7)
Eosinophils Relative: 8 % — ABNORMAL HIGH (ref 0–5)
HCT: 39.6 % (ref 36.0–46.0)
Hemoglobin: 13.5 g/dL (ref 12.0–15.0)
LYMPHS ABS: 2.4 10*3/uL (ref 0.7–4.0)
Lymphocytes Relative: 33 % (ref 12–46)
MCH: 29.7 pg (ref 26.0–34.0)
MCHC: 34.1 g/dL (ref 30.0–36.0)
MCV: 87 fL (ref 78.0–100.0)
MONO ABS: 0.6 10*3/uL (ref 0.1–1.0)
Monocytes Relative: 8 % (ref 3–12)
NEUTROS ABS: 3.7 10*3/uL (ref 1.7–7.7)
Neutrophils Relative %: 50 % (ref 43–77)
Platelets: 271 10*3/uL (ref 150–400)
RBC: 4.55 MIL/uL (ref 3.87–5.11)
RDW: 13.1 % (ref 11.5–15.5)
WBC: 7.3 10*3/uL (ref 4.0–10.5)

## 2014-07-09 LAB — URINE MICROSCOPIC-ADD ON

## 2014-07-09 LAB — LIPASE, BLOOD: LIPASE: 27 U/L (ref 11–59)

## 2014-07-09 MED ORDER — IOHEXOL 300 MG/ML  SOLN
25.0000 mL | Freq: Once | INTRAMUSCULAR | Status: AC | PRN
  Administered 2014-07-09: 25 mL via ORAL

## 2014-07-09 MED ORDER — IOHEXOL 300 MG/ML  SOLN
100.0000 mL | Freq: Once | INTRAMUSCULAR | Status: AC | PRN
  Administered 2014-07-09: 100 mL via INTRAVENOUS

## 2014-07-09 NOTE — ED Notes (Signed)
CT notified pt is finished with contrast. 

## 2014-07-09 NOTE — ED Provider Notes (Signed)
CSN: [id number]     Arrival date & time 07/09/14  2058 History   First MD Initiated Contact with Patient 07/09/14 2202     Chief Complaint  Patient presents with  . Abdominal Pain     (Consider location/radiation/quality/duration/timing/severity/associated sxs/prior Treatment) HPI Comments: Patient presents to the ER for evaluation of abdominal pain. Patient has been experiencing pain on the right side of her abdomen, mostly in the lower portion, since earlier this morning. Patient reports that it is a dull aching pain and she has intermittent sharp stabbing pains in the area. She has had nausea and vomiting associated with symptoms. She has not had any diarrhea or constipation. Denies urinary symptoms. She has not had any fever. Patient reports that she is currently menstruating. She has noticed increased pain and cramping with her menstrual cycle in the last few months.  Patient is a 24 y.o. female presenting with abdominal pain.  Abdominal Pain Associated symptoms: nausea, vaginal bleeding and vomiting     History reviewed. No pertinent past medical history. Past Surgical History  Procedure Laterality Date  . Ankle arthroplasty    . Feet surgery     No family history on file. History  Substance Use Topics  . Smoking status: Current Every Day Smoker  . Smokeless tobacco: Not on file  . Alcohol Use: No   OB History    Gravida Para Term Preterm AB TAB SAB Ectopic Multiple Living   1              Review of Systems  Gastrointestinal: Positive for nausea, vomiting and abdominal pain.  Genitourinary: Positive for vaginal bleeding.  All other systems reviewed and are negative.     Allergies  Penicillins  Home Medications   Prior to Admission medications   Medication Sig Start Date End Date Taking? Authorizing Provider  acetaminophen (TYLENOL) 500 MG tablet Take 1,000 mg by mouth every 6 (six) hours as needed for moderate pain.   Yes Historical Provider, MD   cyclobenzaprine (FLEXERIL) 10 MG tablet Take 1 tablet (10 mg total) by mouth 2 (two) times daily as needed for muscle spasms. 08/23/13   Tiffany Irine Seal, PA-C  HYDROcodone-acetaminophen (NORCO/VICODIN) 5-325 MG per tablet Take 1-2 tablets by mouth every 4 (four) hours as needed. 08/23/13   Tiffany Irine Seal, PA-C  ondansetron (ZOFRAN) 4 MG tablet Take 1 tablet (4 mg total) by mouth every 6 (six) hours. 08/23/13   Tiffany Irine Seal, PA-C  predniSONE (DELTASONE) 10 MG tablet Take 4 tablets (40 mg total) by mouth daily. 08/23/13   Tiffany Irine Seal, PA-C   BP 145/96 mmHg  Pulse 61  Temp(Src) 98.8 F (37.1 C)  Resp 19  SpO2 100%  LMP 07/07/2014 Physical Exam  Constitutional: She is oriented to person, place, and time. She appears well-developed and well-nourished. No distress.  HENT:  Head: Normocephalic and atraumatic.  Right Ear: Hearing normal.  Left Ear: Hearing normal.  Nose: Nose normal.  Mouth/Throat: Oropharynx is clear and moist and mucous membranes are normal.  Eyes: Conjunctivae and EOM are normal. Pupils are equal, round, and reactive to light.  Neck: Normal range of motion. Neck supple.  Cardiovascular: Regular rhythm, S1 normal and S2 normal.  Exam reveals no gallop and no friction rub.   No murmur heard. Pulmonary/Chest: Effort normal and breath sounds normal. No respiratory distress. She exhibits no tenderness.  Abdominal: Soft. Normal appearance and bowel sounds are normal. There is no hepatosplenomegaly. There is tenderness in the right  lower quadrant. There is no rebound, no guarding, no tenderness at McBurney's point and negative Murphy's sign. No hernia. Hernia confirmed negative in the right inguinal area and confirmed negative in the left inguinal area.  Genitourinary: Vagina normal and uterus normal. There is no tenderness on the right labia. There is no tenderness on the left labia. Cervix exhibits no motion tenderness, no discharge and no friability. Right adnexum displays  no mass and no tenderness. Left adnexum displays no mass and no tenderness.  Musculoskeletal: Normal range of motion.  Neurological: She is alert and oriented to person, place, and time. She has normal strength. No cranial nerve deficit or sensory deficit. Coordination normal. GCS eye subscore is 4. GCS verbal subscore is 5. GCS motor subscore is 6.  Skin: Skin is warm, dry and intact. No rash noted. No cyanosis.  Psychiatric: She has a normal mood and affect. Her speech is normal and behavior is normal. Thought content normal.  Nursing note and vitals reviewed.   ED Course  Procedures (including critical care time) Labs Review Labs Reviewed  CBC WITH DIFFERENTIAL/PLATELET - Abnormal; Notable for the following:    Eosinophils Relative 8 (*)    All other components within normal limits  COMPREHENSIVE METABOLIC PANEL - Abnormal; Notable for the following:    ALT 37 (*)    Total Bilirubin 0.2 (*)    All other components within normal limits  URINALYSIS, ROUTINE W REFLEX MICROSCOPIC - Abnormal; Notable for the following:    APPearance CLOUDY (*)    Hgb urine dipstick MODERATE (*)    All other components within normal limits  URINE MICROSCOPIC-ADD ON - Abnormal; Notable for the following:    Squamous Epithelial / LPF FEW (*)    Bacteria, UA MANY (*)    All other components within normal limits  WET PREP, GENITAL  LIPASE, BLOOD  RPR  HIV ANTIBODY (ROUTINE TESTING)  POC URINE PREG, ED  GC/CHLAMYDIA PROBE AMP ([place])    Imaging Review Ct Abdomen Pelvis W Contrast  07/09/2014   CLINICAL DATA:  Right upper abdominal pain.  EXAM: CT ABDOMEN AND PELVIS WITH CONTRAST  TECHNIQUE: Multidetector CT imaging of the abdomen and pelvis was performed using the standard protocol following bolus administration of intravenous contrast.  CONTRAST:  25mL OMNIPAQUE IOHEXOL 300 MG/ML SOLN, 100mL OMNIPAQUE IOHEXOL 300 MG/ML SOLN  COMPARISON:  08/23/2013  FINDINGS: Lung bases are clear.  The liver,  spleen, gallbladder, pancreas, adrenal glands, kidneys, abdominal aorta, inferior vena cava, and retroperitoneal lymph nodes are unremarkable. Stomach, small bowel, and colon are not abnormally distended. No discrete wall thickening is appreciated. Stool fills the colon. No free air or free fluid in the abdomen. Scattered mesenteric lymph nodes are not pathologically enlarged.  Pelvis: Appendix is normal. Uterus and ovaries are not enlarged. Bladder wall is not thickened. No free or loculated pelvic fluid collections. No pelvic mass or lymphadenopathy. Mild degenerative changes in the lower thoracic spine. No destructive bone lesions.  IMPRESSION: No acute process demonstrated in the abdomen or pelvis. Probable reactive lymph nodes in the mesentery.   Electronically Signed   By: Burman NievesWilliam  Stevens M.D.   On: 07/09/2014 23:42     EKG Interpretation None      MDM   Final diagnoses:  Abdominal pain    Patient presents to the ER for evaluation of pain in the right side of her abdomen. She reports symptoms have been present all day, gradually worsening. She also just started menstruating. She reports  that she has been having increased cramping when she menstruates over recent months. She did have diffuse tenderness on the right side without guarding or rebound. Pelvic exam was unremarkable, no discharge, no cervical motion tenderness, no adnexal mass or tenderness. CT scan was therefore performed. No acute abnormality is seen. Patient treated with analgesia, will refer to OB/GYN for change in menstrual symptoms.    Gilda Crease, MD 07/10/14 [phone]

## 2014-07-09 NOTE — ED Notes (Signed)
The pt has had abd pain since this am. n v   lmp  today

## 2014-07-10 LAB — WET PREP, GENITAL
TRICH WET PREP: NONE SEEN
Yeast Wet Prep HPF POC: NONE SEEN

## 2014-07-10 LAB — GC/CHLAMYDIA PROBE AMP (~~LOC~~) NOT AT ARMC
Chlamydia: NEGATIVE
Neisseria Gonorrhea: NEGATIVE

## 2014-07-10 LAB — RPR: RPR Ser Ql: NONREACTIVE

## 2014-07-10 MED ORDER — MORPHINE SULFATE 4 MG/ML IJ SOLN
4.0000 mg | Freq: Once | INTRAMUSCULAR | Status: AC
  Administered 2014-07-10: 4 mg via INTRAVENOUS
  Filled 2014-07-10: qty 1

## 2014-07-10 MED ORDER — ONDANSETRON HCL 4 MG/2ML IJ SOLN
4.0000 mg | Freq: Once | INTRAMUSCULAR | Status: AC
  Administered 2014-07-10: 4 mg via INTRAVENOUS
  Filled 2014-07-10: qty 2

## 2014-07-10 MED ORDER — HYDROCODONE-ACETAMINOPHEN 5-325 MG PO TABS: 2.0000 | ORAL_TABLET | ORAL | Status: DC | PRN

## 2014-07-10 MED ORDER — IBUPROFEN 800 MG PO TABS: 800.0000 mg | ORAL_TABLET | Freq: Three times a day (TID) | ORAL | Status: DC

## 2014-07-10 NOTE — Discharge Instructions (Signed)
Abdominal Pain, Women °Abdominal (stomach, pelvic, or belly) pain can be caused by many things. It is important to tell your doctor: °· The location of the pain. °· Does it come and go or is it present all the time? °· Are there things that start the pain (eating certain foods, exercise)? °· Are there other symptoms associated with the pain (fever, nausea, vomiting, diarrhea)? °All of this is helpful to know when trying to find the cause of the pain. °CAUSES  °· Stomach: virus or bacteria infection, or ulcer. °· Intestine: appendicitis (inflamed appendix), regional ileitis (Crohn's disease), ulcerative colitis (inflamed colon), irritable bowel syndrome, diverticulitis (inflamed diverticulum of the colon), or cancer of the stomach or intestine. °· Gallbladder disease or stones in the gallbladder. °· Kidney disease, kidney stones, or infection. °· Pancreas infection or cancer. °· Fibromyalgia (pain disorder). °· Diseases of the female organs: °· Uterus: fibroid (non-cancerous) tumors or infection. °· Fallopian tubes: infection or tubal pregnancy. °· Ovary: cysts or tumors. °· Pelvic adhesions (scar tissue). °· Endometriosis (uterus lining tissue growing in the pelvis and on the pelvic organs). °· Pelvic congestion syndrome (female organs filling up with blood just before the menstrual period). °· Pain with the menstrual period. °· Pain with ovulation (producing an egg). °· Pain with an IUD (intrauterine device, birth control) in the uterus. °· Cancer of the female organs. °· Functional pain (pain not caused by a disease, may improve without treatment). °· Psychological pain. °· Depression. °DIAGNOSIS  °Your doctor will decide the seriousness of your pain by doing an examination. °· Blood tests. °· X-rays. °· Ultrasound. °· CT scan (computed tomography, special type of X-ray). °· MRI (magnetic resonance imaging). °· Cultures, for infection. °· Barium enema (dye inserted in the large intestine, to better view it with  X-rays). °· Colonoscopy (looking in intestine with a lighted tube). °· Laparoscopy (minor surgery, looking in abdomen with a lighted tube). °· Major abdominal exploratory surgery (looking in abdomen with a large incision). °TREATMENT  °The treatment will depend on the cause of the pain.  °· Many cases can be observed and treated at home. °· Over-the-counter medicines recommended by your caregiver. °· Prescription medicine. °· Antibiotics, for infection. °· Birth control pills, for painful periods or for ovulation pain. °· Hormone treatment, for endometriosis. °· Nerve blocking injections. °· Physical therapy. °· Antidepressants. °· Counseling with a psychologist or psychiatrist. °· Minor or major surgery. °HOME CARE INSTRUCTIONS  °· Do not take laxatives, unless directed by your caregiver. °· Take over-the-counter pain medicine only if ordered by your caregiver. Do not take aspirin because it can cause an upset stomach or bleeding. °· Try a clear liquid diet (broth or water) as ordered by your caregiver. Slowly move to a bland diet, as tolerated, if the pain is related to the stomach or intestine. °· Have a thermometer and take your temperature several times a day, and record it. °· Bed rest and sleep, if it helps the pain. °· Avoid sexual intercourse, if it causes pain. °· Avoid stressful situations. °· Keep your follow-up appointments and tests, as your caregiver orders. °· If the pain does not go away with medicine or surgery, you may try: °· Acupuncture. °· Relaxation exercises (yoga, meditation). °· Group therapy. °· Counseling. °SEEK MEDICAL CARE IF:  °· You notice certain foods cause stomach pain. °· Your home care treatment is not helping your pain. °· You need stronger pain medicine. °· You want your IUD removed. °· You feel faint or   lightheaded. °· You develop nausea and vomiting. °· You develop a rash. °· You are having side effects or an allergy to your medicine. °SEEK IMMEDIATE MEDICAL CARE IF:  °· Your  pain does not go away or gets worse. °· You have a fever. °· Your pain is felt only in portions of the abdomen. The right side could possibly be appendicitis. The left lower portion of the abdomen could be colitis or diverticulitis. °· You are passing blood in your stools (bright red or black tarry stools, with or without vomiting). °· You have blood in your urine. °· You develop chills, with or without a fever. °· You pass out. °MAKE SURE YOU:  °· Understand these instructions. °· Will watch your condition. °· Will get help right away if you are not doing well or get worse. °Document Released: 02/16/2007 Document Revised: 09/05/2013 Document Reviewed: 03/08/2009 °[employer]® Patient Information ©2015 [employer]. This information is not intended to replace advice given to you by your health care provider. Make sure you discuss any questions you have with your health care provider. ° ° ° °Dysmenorrhea °Menstrual cramps (dysmenorrhea) are caused by the muscles of the uterus tightening (contracting) during a menstrual period. For some women, this discomfort is merely bothersome. For others, dysmenorrhea can be severe enough to interfere with everyday activities for a few days each month. °Primary dysmenorrhea is menstrual cramps that last a couple of days when you start having menstrual periods or soon after. This often begins after a teenager starts having her period. As a woman gets older or has a baby, the cramps will usually lessen or disappear. Secondary dysmenorrhea begins later in life, lasts longer, and the pain may be stronger than primary dysmenorrhea. The pain may start before the period and last a few days after the period.  °CAUSES  °Dysmenorrhea is usually caused by an underlying problem, such as: °· The tissue lining the uterus grows outside of the uterus in other areas of the body (endometriosis). °· The endometrial tissue, which normally lines the uterus, is found in or grows into the muscular walls  of the uterus (adenomyosis). °· The pelvic blood vessels are engorged with blood just before the menstrual period (pelvic congestive syndrome). °· Overgrowth of cells (polyps) in the lining of the uterus or cervix. °· Falling down of the uterus (prolapse) because of loose or stretched ligaments. °· Depression. °· Bladder problems, infection, or inflammation. °· Problems with the intestine, a tumor, or irritable bowel syndrome. °· Cancer of the female organs or bladder. °· A severely tipped uterus. °· A very tight opening or closed cervix. °· Noncancerous tumors of the uterus (fibroids). °· Pelvic inflammatory disease (PID). °· Pelvic scarring (adhesions) from a previous surgery. °· Ovarian cyst. °· An intrauterine device (IUD) used for birth control. °RISK FACTORS °You may be at greater risk of dysmenorrhea if: °· You are younger than age 30. °· You started puberty early. °· You have irregular or heavy bleeding. °· You have never given birth. °· You have a family history of this problem. °· You are a smoker. °SIGNS AND SYMPTOMS  °· Cramping or throbbing pain in your lower abdomen. °· Headaches. °· Lower back pain. °· Nausea or vomiting. °· Diarrhea. °· Sweating or dizziness. °· Loose stools. °DIAGNOSIS  °A diagnosis is based on your history, symptoms, physical exam, diagnostic tests, or procedures. Diagnostic tests or procedures may include: °· Blood tests. °· Ultrasonography. °· An examination of the lining of the uterus (dilation and   curettage, D&C). °· An examination inside your abdomen or pelvis with a scope (laparoscopy). °· X-rays. °· CT scan. °· MRI. °· An examination inside the bladder with a scope (cystoscopy). °· An examination inside the intestine or stomach with a scope (colonoscopy, gastroscopy). °TREATMENT  °Treatment depends on the cause of the dysmenorrhea. Treatment may include: °· Pain medicine prescribed by your health care provider. °· Birth control pills or an IUD with progesterone hormone in  it. °· Hormone replacement therapy. °· Nonsteroidal anti-inflammatory drugs (NSAIDs). These may help stop the production of prostaglandins. °· Surgery to remove adhesions, endometriosis, ovarian cyst, or fibroids. °· Removal of the uterus (hysterectomy). °· Progesterone shots to stop the menstrual period. °· Cutting the nerves on the sacrum that go to the female organs (presacral neurectomy). °· Electric current to the sacral nerves (sacral nerve stimulation). °· Antidepressant medicine. °· Psychiatric therapy, counseling, or group therapy. °· Exercise and physical therapy. °· Meditation and yoga therapy. °· Acupuncture. °HOME CARE INSTRUCTIONS  °· Only take over-the-counter or prescription medicines as directed by your health care provider. °· Place a heating pad or hot water bottle on your lower back or abdomen. Do not sleep with the heating pad. °· Use aerobic exercises, walking, swimming, biking, and other exercises to help lessen the cramping. °· Massage to the lower back or abdomen may help. °· Stop smoking. °· Avoid alcohol and caffeine. °SEEK MEDICAL CARE IF:  °· Your pain does not get better with medicine. °· You have pain with sexual intercourse. °· Your pain increases and is not controlled with medicines. °· You have abnormal vaginal bleeding with your period. °· You develop nausea or vomiting with your period that is not controlled with medicine. °SEEK IMMEDIATE MEDICAL CARE IF:  °You pass out.  °Document Released: 04/21/2005 Document Revised: 12/22/2012 Document Reviewed: 10/07/2012 °[employer]® Patient Information ©2015 [employer]. This information is not intended to replace advice given to you by your health care provider. Make sure you discuss any questions you have with your health care provider. ° °

## 2014-07-11 LAB — HIV ANTIBODY (ROUTINE TESTING W REFLEX): HIV Screen 4th Generation wRfx: NONREACTIVE

## 2014-08-10 ENCOUNTER — Emergency Department (HOSPITAL_COMMUNITY): Payer: Self-pay

## 2014-08-10 ENCOUNTER — Encounter (HOSPITAL_COMMUNITY): Payer: Self-pay | Admitting: Emergency Medicine

## 2014-08-10 ENCOUNTER — Emergency Department (HOSPITAL_COMMUNITY)
Admission: EM | Admit: 2014-08-10 | Discharge: 2014-08-10 | Disposition: A | Discharge status: Home / Self Care | Payer: Self-pay | Attending: Emergency Medicine | Admitting: Emergency Medicine

## 2014-08-10 DIAGNOSIS — Z880 Allergy status to penicillin: Secondary | ICD-10-CM | POA: Insufficient documentation

## 2014-08-10 DIAGNOSIS — R079 Chest pain, unspecified: Secondary | ICD-10-CM | POA: Insufficient documentation

## 2014-08-10 DIAGNOSIS — Z79899 Other long term (current) drug therapy: Secondary | ICD-10-CM | POA: Insufficient documentation

## 2014-08-10 DIAGNOSIS — R0789 Other chest pain: Secondary | ICD-10-CM

## 2014-08-10 DIAGNOSIS — Z720 Tobacco use: Secondary | ICD-10-CM | POA: Insufficient documentation

## 2014-08-10 HISTORY — DX: Endometriosis, unspecified: N80.9

## 2014-08-10 LAB — BASIC METABOLIC PANEL
ANION GAP: 11 (ref 5–15)
BUN: 9 mg/dL (ref 6–23)
CALCIUM: 9.6 mg/dL (ref 8.4–10.5)
CO2: 21 mmol/L (ref 19–32)
CREATININE: 0.7 mg/dL (ref 0.50–1.10)
Chloride: 108 mmol/L (ref 96–112)
GFR calc Af Amer: >90 mL/min (ref >90)
Glucose, Bld: 85 mg/dL (ref 70–99)
Potassium: 3.7 mmol/L (ref 3.5–5.1)
SODIUM: 140 mmol/L (ref 135–145)

## 2014-08-10 LAB — CBC
HCT: 43 % (ref 36.0–46.0)
Hemoglobin: 15 g/dL (ref 12.0–15.0)
MCH: 30.8 pg (ref 26.0–34.0)
MCHC: 34.9 g/dL (ref 30.0–36.0)
MCV: 88.3 fL (ref 78.0–100.0)
PLATELETS: 262 10*3/uL (ref 150–400)
RBC: 4.87 MIL/uL (ref 3.87–5.11)
RDW: 13.1 % (ref 11.5–15.5)
WBC: 11.8 10*3/uL — ABNORMAL HIGH (ref 4.0–10.5)

## 2014-08-10 LAB — I-STAT BETA HCG BLOOD, ED (MC, WL, AP ONLY)

## 2014-08-10 LAB — I-STAT TROPONIN, ED: Troponin i, poc: 0 ng/mL (ref 0.00–0.08)

## 2014-08-10 LAB — D-DIMER, QUANTITATIVE: D-Dimer, Quant: 0.41 ug/mL-FEU (ref 0.00–0.48)

## 2014-08-10 MED ORDER — IBUPROFEN 400 MG PO TABS
400.0000 mg | ORAL_TABLET | Freq: Once | ORAL | Status: AC
  Administered 2014-08-10: 400 mg via ORAL
  Filled 2014-08-10: qty 1

## 2014-08-10 MED ORDER — METHOCARBAMOL 500 MG PO TABS
500.0000 mg | ORAL_TABLET | Freq: Once | ORAL | Status: AC
  Administered 2014-08-10: 500 mg via ORAL
  Filled 2014-08-10: qty 1

## 2014-08-10 MED ORDER — METHOCARBAMOL 500 MG PO TABS: 500.0000 mg | ORAL_TABLET | Freq: Two times a day (BID) | ORAL | Status: DC | PRN

## 2014-08-10 MED ORDER — KETOROLAC TROMETHAMINE 60 MG/2ML IM SOLN
30.0000 mg | Freq: Once | INTRAMUSCULAR | Status: AC
  Administered 2014-08-10: 30 mg via INTRAMUSCULAR
  Filled 2014-08-10: qty 2

## 2014-08-10 NOTE — ED Notes (Signed)
Pt presents with chest pain that radiates into bilateral shoulder blades onset 3 hours ago- denies SOB, admits to some nausea.  No acute distress noted.

## 2014-08-10 NOTE — Discharge Instructions (Signed)
Starting tomorrow you can take   of Motrin (also known as ibuprofen). That is usually 4 over the counter pills,  3 times a day. Take with food to minimize stomach irritation   You can also take  tylenol (acetaminophen)  (this is 3 over the counter pills) four times a day. Do not drink alcohol or combine with other medications that have acetaminophen as an ingredient (Read the labels!).    For breakthrough pain you may take Robaxin. Do not drink alcohol, drive or operate heavy machinery when taking Robaxin.  Do not hesitate to return to the emergency room for any new, worsening or concerning symptoms.  Please obtain primary care using resource guide below. But the minute you were seen in the emergency room and that they will need to obtain records for further outpatient management.  Ermelinda Das  Emergency Department Resource Guide 1) Find a Doctor and Pay Out of Pocket Although you won't have to find out who is covered by your insurance plan, it is a good idea to ask around and get recommendations. You will then need to call the office and see if the doctor you have chosen will accept you as a new patient and what types of options they offer for patients who are self-pay. Some doctors offer discounts or will set up payment plans for their patients who do not have insurance, but you will need to ask so you aren't surprised when you get to your appointment.  2) Contact Your Local Health Department Not all health departments have doctors that can see patients for sick visits, but many do, so it is worth a call to see if yours does. If you don't know where your local health department is, you can check in your phone book. The CDC also has a tool to help you locate your state's health department, and many state websites also have listings of all of their local health departments.  3) Find a Walk-in Clinic If your illness is not likely to be very severe or complicated, you may want to try a walk in  clinic. These are popping up all over the country in pharmacies, drugstores, and shopping centers. They're usually staffed by nurse practitioners or physician assistants that have been trained to treat common illnesses and complaints. They're usually fairly quick and inexpensive. However, if you have serious medical issues or chronic medical problems, these are probably not your best option.  No Primary Care Doctor: - Call Health Connect at  [phone] - they can help you locate a primary care doctor that  accepts your insurance, provides certain services, etc. - Physician Referral Service- [phone]  Chronic Pain Problems: Organization         Address  Phone   Notes  [facility]  [phone] Patients need to be referred by their primary care doctor.   Medication Assistance: Organization         Address  Phone   Notes  [facility] [address] [place], [place] [address] [phone] --Must be a resident of [facility] -- Must have NO insurance coverage whatsoever (no Medicaid/ Medicare, etc.) -- The pt. MUST have a primary care doctor that directs their care regularly and follows them in the community   [facility]  [phone]   [employer]  [phone]    Agencies that provide inexpensive medical care: Retail buyer  Notes  °[facility]  ([phone]) [phone]   °[facility]    ([phone]) [phone]   °[facility] [address] °[place], [place] [address] ([phone]) [phone]   °[facility] [address], °[place] (336) [phone]   °[facility]    (336) 373-0678   °[facility]    (336) 272-1050   °[facility] ° [address], [place] Phone:  ([phone]) [phone], Fax:  [phone] Hours of Operation:  9 am - 6 pm, M-F.  Also accepts Medicaid/Medicare and self-pay.  °[facility] ° [address], [place] Phone: ([phone]) [phone], Fax: [phone]. Hours of Operation:  8:30 am - 5:30 pm, M-F.  Also accepts Medicaid and self-pay.  °[facility] [place] [address], [place] Phone: ([phone]) [phone]   °[facility] [address], [place], [place] ([phone], Ext. 123 Mondays & Thursdays: 7-9 AM.  First 15 patients are seen on a first come, first serve basis. °  ° °Medicaid-accepting [place] Providers: ° °Organization         Address  Phone   Notes  °[facility] [address], [place] ([phone]) [phone] Also accepts self-pay patients.  °[facility] [address], [place] ° (336) [phone]   °[facility] [address], [place] (336) [phone]   °[facility] [address] [place] [address], [place] (336) [phone]   °Veita Bland [address], [place]  ° ([phone]) [phone] Only accepts [place] Access Medicaid patients after they have their name applied to their card.  ° °Self-Pay (no insurance) in [place]: ° °Organization         Address  Phone   Notes  °Sickle Cell Patients, [facility] [address], [place] (336) [phone]   °[facility] [address], [place] (336) [phone]   ° [facility] ° [address], [place] (336) [phone]   °[facility]/Dr. Osei-Bonsu ° [address] [place] [address], [place] or [address], [place] ([phone]) [phone] Phone number for both [place] and [place] locations is the same.  °[facility] [address], [place] (336) [phone]   °[facility] [address] [place] [address], [place] or [address] (336) [phone] °(336) [phone]   °[facility] [address],  (336) [phone], phone; ([phone]) [phone], fax Sees patients  1st and 3rd Saturday of every month.  Must not qualify for public or private insurance (i.e. Medicaid, Medicare, [place] Health Choice, Veterans' Benefits) • Household income should be no more than 200% of the poverty level •The clinic cannot treat you if you are pregnant or think you are pregnant • Sexually transmitted diseases are not treated at the clinic.  ° ° °Dental Care: °Organization         Address  Phone  Notes  °[place] [facility] [address],  (336) [phone] Accepts children up to age 21 who are enrolled in Medicaid or [place] Health Choice; pregnant women with a Medicaid card; and children who have applied for Medicaid or [place] Health Choice, but were declined, whose parents can pay a reduced fee at time of service.  °[place]   Department of [facility]  [address], [place] [phone] Accepts children up to age 36 who are enrolled in [place] or [place]; pregnant women with a Medicaid card; and children who have applied for Medicaid or [place] Health Choice, but were declined, whose parents can pay a reduced fee at time of service.  [facility]  [place] [phone] Patients are seen by appointment only. Walk-ins are not accepted. [place] will see patients 32 years of age and older. Monday - Tuesday (8am-5pm) Most Wednesdays (8:30-5pm) $30 per visit, cash only  [facility]  [address], [facility] [phone] Patients are seen by appointment only. Walk-ins are not accepted. [place] will see patients 82 years of age and older. One Wednesday Evening (Monthly: Volunteer Based).  $30 per visit, cash only  [place]  [phone] for adults; Children under age 20, call Graduate Pediatric Dentistry at [phone]. Children aged 74-14, please call [phone] to request a  pediatric application.  Dental services are provided in all areas of dental care including fillings, crowns and bridges, complete and partial dentures, implants, gum treatment, root canals, and extractions. Preventive care is also provided. Treatment is provided to both adults and children. Patients are selected via a lottery and there is often a waiting list.   [facility] [address], [place]  [phone] [email or web address]   [facility] [address] [place], [place] [phone], Ext. 123 Second and Fourth Thursday of each month, opens at 6:30 AM; Clinic ends at 9 AM.  Patients are seen on a first-come first-served basis, and a limited number are seen during each clinic.   [facility]  [address] Hillard Danker [place], [place] [phone]   Eligibility Requirements You must have lived in [place], [place], or [place] counties for at least the last three months.   You cannot be eligible for state or federal sponsored [employer], including [employer], [place], or [employer].   You generally cannot be eligible for healthcare insurance through your employer.    How to apply: Eligibility screenings are held every Tuesday and Wednesday afternoon from 1:00 pm until 4:00 pm. You do not need an appointment for the interview!  [facility] [address], [place], [place]   [place]  [place] Department  [place]  [phone]    Behavioral Health Resources in the Community: Intensive Outpatient Programs Organization         Address  Phone  Notes  [facility] [address], [place], [place] [phone]   [facility] [address] [place], [place]   ADS: [facility] [address] [place], [place]   [place] [address],  [place], [place] or [phone]   Substance Abuse Resources Organization         Address  Phone  Notes  [facility]  [phone]   [place]  [phone]   [facility] [place]   Chinita Pester  [phone]   Residential & Outpatient Substance Abuse Program  ([phone])[phone]   Psychological Services Organization         Address  Phone  Notes  [facility]  [phone]- [phone]   [facility]  [phone]- [phone]   [facility] [address], [place] [phone] or [phone]    Mobile Crisis Teams Organization         Address  Phone  Notes  [facility], Mobile Crisis Care Unit  [phone]   [facility]  [address] [place], [place] [phone]   Doristine LocksSharon DeEsch [address] [place] [phone]    Self-Help/Support Groups Organization         Address  Phone             Notes  [facility] [place] - variety of support groups  336- [phone] Call for more information  Narcotics Anonymous (NA), [facility] [address], [place]  2 meetings at this location   Statisticianesidential Treatment Programs Organization         Address  Phone  Notes  [facility] [address],    [place]  [phone]   [facility]  [address], [place] [phone], [place], [place] [phone]   [facility] [address], [place] [phone] Admissions: 8am-3pm M-F  [facility] [address]    [place], [place] [phone]   [facility] [address], [place], [place] [phone]   [facility] [address]  [place], [place] [phone]   [facility] - Intensive Outpatient [address], [place], [place] [phone]   [facility] ([facility] [address]    [place], [place] [phone] or [phone]   [facility] ([facility]) [address] [place], [place] [phone] Accepts Medicaid  [facility] [address]  [place] [phone] Substance Abuse/Addiction Treatment   [facility] Behavioral Health Resources Organization         Address  Phone  Notes  [facility]  ([phone]) [phone]) [phone]   Angie FavaJulie Brannon, PhD [address], [address] [place], [place]   [phone](336) [phone] or [phone](336) [phone]   [facility]   [address] [place], [place] [phone](336) [phone]   [facility] [address], [place], [place] [phone](336) [phone] Insurance/Medicaid/sponsorship through [facility]  [facility] [address]                                    [place], [place] [phone]) [phone] Therapy/tele-psych/case  [facility] [address]   [place], [place] [phone](336) [phone]    Dr. Lolly MustacheArfeen  [phone](336) [phone]   [facility]  [employer] [facility] 1) [address], [place] 2) [address], [place] 3)  [address], [place] [phone](336) [phone](336) [phone]  [phone]) [phone]   [facility] Child Abuse Hotline [phone](336) [phone] or [phone](336) [phone] (After Hours)        Chest Wall Pain Chest wall pain is pain in or around the bones and muscles of your chest. It may take up to 6 weeks to get better. It may take longer if you must stay physically active in your work and activities.  CAUSES  Chest wall pain may happen on its own. However, it may be caused by:  A viral illness like the flu.  Injury.  Coughing.  Exercise.  Arthritis.  Fibromyalgia.  Shingles. HOME CARE INSTRUCTIONS  Avoid overtiring physical activity. Try not to strain or perform activities that cause pain. This includes any activities using your chest or your abdominal and side muscles, especially if heavy weights are used.  Put ice on the sore area.  Put ice in a plastic bag.  Place a towel  between your skin and the bag.  Leave the ice on for 15-20 minutes per hour while awake for the first 2 days.  Only take over-the-counter or prescription medicines for pain, discomfort, or fever as directed by your caregiver. SEEK IMMEDIATE MEDICAL CARE IF:   Your pain increases, or you are very uncomfortable.  You have a fever.  Your chest pain becomes worse.  You have new, unexplained symptoms.  You have nausea or vomiting.  You feel sweaty or lightheaded.  You have a cough with phlegm (sputum), or you cough up blood. MAKE SURE YOU:   Understand these instructions.  Will watch your condition.  Will get help right away if you are not doing well or get worse. Document Released: 04/21/2005 Document Revised: 07/14/2011 Document Reviewed: 12/16/2010 [facility] Patient Information 2015 [place], [place]. This information is not intended to replace advice given to you by your health care provider. Make sure you discuss any questions you have with your health care provider.

## 2014-08-10 NOTE — ED Notes (Signed)
Pt c/o centralized chest pain radiating to bilateral arms and into shoulder blades while sitting outside with children. Pt reports pain has decreased since arrival. Pain centralized

## 2014-08-10 NOTE — ED Provider Notes (Signed)
CSN: [id number]     Arrival date & time 08/10/14  1825 History  This chart was scribed for non-physician practitioner Melissa EmeryNicole Phoenicia Pirie, PA working with Melissa CoreNathan Pickering, MD, by Melissa Mcmahon, ED Scribe. This patient was seen in room TR08C/TR08C and the patient's care was started at 7:58 PM.     Chief Complaint  Patient presents with  . Chest Pain   The history is provided by the patient. No language interpreter was used.     HPI Comments: Melissa Mcmahon is a 24 y.o. female who presents to the Emergency Department complaining of midsternal chest pain that began around 3:30 PM today (4.5 hours ago). She describes it as a sharp tightness in her chest. She mentions that the pain radiates to her bilateral shoulders and arms. Pt claims that the pain was a 9/10 but that is has since gone down to a 5/10 on the pain scale. She mentions that the pain is greater in her left shoulder than right. Pt admits to right sided jaw pain earlier but that it has since resolved. She felt short of breath but mentions that she is no longer feeling that way. Pt also mentions feeling lightheaded when the pain began. She states that the tightness is exacerbated with deep breathing. Pt reports that she began taking Provera approximately 20 days ago. She denies recent prolonged travel or surgery. She also denies leg swelling, calf pain, diaphoresis, nausea, vomiting, or any other symptoms. No hx DVT/PE or family hx of early cardiac death.    Past Medical History  Diagnosis Date  . Endometriosis    Past Surgical History  Procedure Laterality Date  . Ankle arthroplasty    . Feet surgery     No family history on file. History  Substance Use Topics  . Smoking status: Current Every Day Smoker  . Smokeless tobacco: Not on file  . Alcohol Use: No   OB History    Gravida Para Term Preterm AB TAB SAB Ectopic Multiple Living   1              Review of Systems  A complete 10 system review of systems was obtained and all  systems are negative except as noted in the HPI and PMH.    Allergies  Penicillins  Home Medications   Prior to Admission medications   Medication Sig Start Date End Date Taking? Authorizing Provider  acetaminophen (TYLENOL) 500 MG tablet Take 1,000 mg by mouth every 6 (six) hours as needed for moderate pain.   Yes Historical Provider, MD  ibuprofen (ADVIL,MOTRIN) 800 MG tablet Take 1 tablet (800 mg total) by mouth 3 (three) times daily. 07/10/14  Yes Melissa Creasehristopher J Pollina, MD  medroxyPROGESTERone (PROVERA) 10 MG tablet Take 10 mg by mouth daily.   Yes Historical Provider, MD  HYDROcodone-acetaminophen (NORCO/VICODIN) 5-325 MG per tablet Take 2 tablets by mouth every 4 (four) hours as needed for moderate pain. Patient not taking: Reported on 08/10/2014 07/10/14   Melissa Creasehristopher J Pollina, MD   Triage Vitals: BP 136/86 mmHg  Pulse 89  Temp(Src) 98.5 F (36.9 C) (Oral)  Resp 20  SpO2 100%  LMP 07/11/2014   Physical Exam  Constitutional: She is oriented to person, place, and time. She appears well-developed and well-nourished. No distress.  HENT:  Head: Normocephalic and atraumatic.  Mouth/Throat: Oropharynx is clear and moist.  Eyes: Conjunctivae and EOM are normal. Pupils are equal, round, and reactive to light.  Neck: Neck supple. No tracheal deviation present.  Cardiovascular: Normal rate and regular rhythm.   Pulmonary/Chest: Effort normal. No stridor. No respiratory distress. She has no wheezes. She has no rales. She exhibits tenderness.    Abdominal: Soft. She exhibits no distension and no mass. There is no tenderness. There is no rebound and no guarding.  Musculoskeletal: Normal range of motion. She exhibits no edema or tenderness.  No calf asymmetry, superficial collaterals, palpable cords, edema, Homans sign negative bilaterally.   Neurological: She is alert and oriented to person, place, and time.  Skin: Skin is warm and dry.  Psychiatric: She has a normal mood and affect. Her  behavior is normal.  Nursing note and vitals reviewed.   ED Course  Procedures (including critical care time)  DIAGNOSTIC STUDIES: Oxygen Saturation is 100% on RA, normal by my interpretation.    COORDINATION OF CARE:. 8:05 PM-Discussed treatment plan which includes  CXR, D-dimer, beta HCG, CBC, CMP, Troponin with pt at bedside and pt agreed to plan.   Labs Review Labs Reviewed  CBC - Abnormal; Notable for the following:    WBC 11.8 (*)    All other components within normal limits  BASIC METABOLIC PANEL  D-DIMER, QUANTITATIVE  I-STAT TROPOININ, ED  I-STAT BETA HCG BLOOD, ED ([facility], [facility], [facility] ONLY)    Imaging Review Dg Chest 2 View  08/10/2014   CLINICAL DATA:  Chest pain and shortness of breath for 1 day  EXAM: CHEST  2 VIEW  COMPARISON:  Sep 20, 2010  FINDINGS: The heart size and mediastinal contours are within normal limits. There is no focal infiltrate, pulmonary edema, or pleural effusion. The visualized skeletal structures are unremarkable.  IMPRESSION: No active cardiopulmonary disease.   Electronically Signed   By: Melissa Mcmahon M.D.   On: 08/10/2014 20:19     EKG Interpretation None      MDM   Final diagnoses:  Chest wall pain    Filed Vitals:   08/10/14 1839  BP: 136/86  Pulse: 89  Temp: 98.5 F (36.9 C)  TempSrc: Oral  Resp: 20  SpO2: 100%    Medications  ketorolac (TORADOL) injection 30 mg (not administered)  methocarbamol (ROBAXIN) tablet 500 mg (not administered)  ibuprofen (ADVIL,MOTRIN) tablet 400 mg (400 mg Oral Given 08/10/14 2007)    [name] is a pleasant 24 y.o. female presenting with acute onset of chest pain this afternoon. Patient is tender to palpation along the anterior chest and into the left shoulder. She has no signs of a DVT however, she started Provera 20 days ago. I will order a d-dimer. EKG, chest x-ray, blood work including triage initiated troponin is negative.  D-dimer is negative. Likely costochondritis. Will give her  Toradol IM. I will encourage her to start Motrin tomorrow and also Robaxin for breakthrough pain.  Evaluation does not show pathology that would require ongoing emergent intervention or inpatient treatment. Pt is hemodynamically stable and mentating appropriately. Discussed findings and plan with patient/guardian, who agrees with care plan. All questions answered. Return precautions discussed and outpatient follow up given.   New Prescriptions   METHOCARBAMOL (ROBAXIN) 500 MG TABLET    Take 1 tablet (500 mg total) by mouth 2 (two) times daily as needed for muscle spasms.    I personally performed the services described in this documentation, which was scribed in my presence. The recorded information has been reviewed and is accurate.     Melissa Emery, PA-C 08/10/14 2106  Melissa Core, MD 08/10/14 [phone]

## 2014-08-25 NOTE — Op Note (Signed)
PATIENT NAME:  Melissa Mcmahon, Melissa Mcmahon MR#:  [id number] DATE OF BIRTH:  [birth year]  DATE OF PROCEDURE:  12/04/2012  PREOPERATIVE DIAGNOSIS: Multiparity, desires permanent sterilization.   POSTOPERATIVE DIAGNOSIS: Multiparity, desires permanent sterilization.   PROCEDURE: Postpartum bilateral tubal ligation, Parkland method.   SURGEON: Thomasene MohairStephen Janeese Mcgloin, M.D.   ANESTHESIA: General.   ESTIMATED BLOOD LOSS: Minimal.   OPERATIVE FLUIDS: 400 mL crystalloid.   COMPLICATIONS: None.   FINDINGS: Normal-appearing uterus and fallopian tubes.   SPECIMENS: Portion of right and left fallopian tubes.   CONDITION AT THE END OF PROCEDURE: Stable.   INDICATIONS FOR PROCEDURE: A 24 year old, gravida 2, para 2 female who is now status post NSVD, who desires permanent sterilization. The risks/benefits of the procedure were discussed with the patient, including risk of failure of 3 to 5 for every 1000 procedures performed, with an increased risk of ectopic pregnancy should pregnancy occur. I also discussed risk of regret at performance of tubal ligation prior to age of 24. I also discussed in great detail equivalent non-permanent forms of sterilization. The patient voiced understanding and strongly desired to continue with permanent form of sterilization, and she was therefore taken to the Operating Room.   DESCRIPTION OF PROCEDURE: The patient was taken to the Operating Room where general anesthesia was administered and found to be adequate. A small transverse infraumbilical skin incision was made with the scalpel. The incision was carried down to the fascia until the peritoneum was identified and entered. The peritoneum was noted to be free of any adhesions and the incision was then extended with the Mayo scissors.   The patient's right fallopian tube was identified, brought to the incision, and grasped with a Babcock clamp. The tube was followed out to the fimbria. The Babcock clamp was used to grasp the tube  approximately 4 cm from the cornual region. A 3 cm segment of tube was ligated with a free-tie of plain gut using the Parkland method and then excised. Good hemostasis was noted and the tube was returned to the abdomen. The left fallopian tube was also ligated for a 3 cm segment and excised in a similar fashion. Excellent hemostasis noted and the tube returned to the abdomen.   The fascia was closed in a single layer using #0 Vicryl. The skin was closed in a subcuticular fashion using 3-0 Vicryl. The skin closure was reinforced using Dermabond. The patient was given a total of 10 mL of 0.25% Marcaine with epinephrine for local anesthetic both prior to and after the procedure.   The patient tolerated the procedure well. Sponge, lap and needle counts were correct x 2. The patient was awakened in the Operating Room and taken to the recovery area in stable condition.   ____________________________ Conard NovakStephen D. Vennessa Affinito, MD sdj:jm D: 12/04/2012 15:10:27 ET T: 12/04/2012 18:07:35 ET JOB#: [id number]  cc: Conard NovakStephen D. Kolter Reaver, MD, <Dictator> Conard NovakSTEPHEN D Coley Littles MD ELECTRONICALLY SIGNED 12/29/2012 8:09

## 2015-02-02 ENCOUNTER — Encounter: Payer: Self-pay | Admitting: *Deleted

## 2015-02-02 ENCOUNTER — Other Ambulatory Visit: Payer: Self-pay

## 2015-02-02 NOTE — Patient Instructions (Signed)
  Your procedure is scheduled on:02-08-15 Report to [facility] SAME [facility] 2ND FLOOR To find out your arrival time please call [phone] between 1PM - 3PM on 02-07-15  Remember: Instructions that are not followed completely may result in serious medical risk, up to and including death, or upon the discretion of your surgeon and anesthesiologist your surgery may need to be rescheduled.    __X__ 1. Do not eat food or drink liquids after midnight. No gum chewing or hard candies.     _X___ 2. No Alcohol for 24 hours before or after surgery.   ____ 3. Bring all medications with you on the day of surgery if instructed.    ____ 4. Notify your doctor if there is any change in your medical condition     (cold, fever, infections).     Do not wear jewelry, make-up, hairpins, clips or nail polish.  Do not wear lotions, powders, or perfumes. You may wear deodorant.  Do not shave 48 hours prior to surgery. Men may shave face and neck.  Do not bring valuables to the hospital.    [facility] is not responsible for any belongings or valuables.               Contacts, dentures or bridgework may not be worn into surgery.  Leave your suitcase in the car. After surgery it may be brought to your room.  For patients admitted to the hospital, discharge time is determined by your  treatment team.   Patients discharged the day of surgery will not be allowed to drive home.   Please read over the following fact sheets that you were given:     ____ Take these medicines the morning of surgery with A SIP OF WATER:    1. NONE  2.   3.   4.  5.  6.  ____ Fleet Enema (as directed)   ____ Use CHG Soap as directed  ____ Use inhalers on the day of surgery  ____ Stop metformin 2 days prior to surgery    ____ Take 1/2 of usual insulin dose the night before surgery and none on the morning of surgery.   ____ Stop Coumadin/Plavix/aspirin on- N/A  __X__ Stop Anti-inflammatories-STOP IBUPROFEN  NOW-NO NSAIDS OR ASA PRODUCTS-TYLENOL OK   ____ Stop supplements until after surgery.    ____ Bring C-Pap to the hospital.

## 2015-02-08 ENCOUNTER — Encounter: Payer: Self-pay | Admitting: *Deleted

## 2015-02-08 ENCOUNTER — Ambulatory Visit
Admission: RE | Admit: 2015-02-08 | Discharge: 2015-02-08 | Disposition: A | Discharge status: Home / Self Care | Payer: No Typology Code available for payment source | Source: Ambulatory Visit | Attending: Obstetrics and Gynecology | Admitting: Obstetrics and Gynecology

## 2015-02-08 ENCOUNTER — Ambulatory Visit: Payer: No Typology Code available for payment source | Admitting: Certified Registered Nurse Anesthetist

## 2015-02-08 ENCOUNTER — Encounter
Admission: RE | Disposition: A | Discharge status: Home / Self Care | Payer: Self-pay | Source: Ambulatory Visit | Attending: Obstetrics and Gynecology

## 2015-02-08 DIAGNOSIS — Z880 Allergy status to penicillin: Secondary | ICD-10-CM | POA: Insufficient documentation

## 2015-02-08 DIAGNOSIS — Z9851 Tubal ligation status: Secondary | ICD-10-CM | POA: Diagnosis not present

## 2015-02-08 DIAGNOSIS — R102 Pelvic and perineal pain: Secondary | ICD-10-CM | POA: Diagnosis present

## 2015-02-08 DIAGNOSIS — J45909 Unspecified asthma, uncomplicated: Secondary | ICD-10-CM | POA: Diagnosis not present

## 2015-02-08 DIAGNOSIS — G8929 Other chronic pain: Secondary | ICD-10-CM | POA: Insufficient documentation

## 2015-02-08 DIAGNOSIS — F17210 Nicotine dependence, cigarettes, uncomplicated: Secondary | ICD-10-CM | POA: Diagnosis not present

## 2015-02-08 DIAGNOSIS — Z9889 Other specified postprocedural states: Secondary | ICD-10-CM

## 2015-02-08 HISTORY — PX: LAPAROSCOPY: SHX197

## 2015-02-08 HISTORY — DX: Headache: R51

## 2015-02-08 HISTORY — DX: Headache, unspecified: R51.9

## 2015-02-08 HISTORY — DX: Unspecified asthma, uncomplicated: J45.909

## 2015-02-08 LAB — POCT PREGNANCY, URINE: PREG TEST UR: NEGATIVE

## 2015-02-08 SURGERY — LAPAROSCOPY, DIAGNOSTIC
Anesthesia: General

## 2015-02-08 MED ORDER — BUPIVACAINE HCL 0.5 % IJ SOLN
INTRAMUSCULAR | Status: DC | PRN
  Administered 2015-02-08: 10 mL

## 2015-02-08 MED ORDER — FENTANYL CITRATE (PF) 100 MCG/2ML IJ SOLN
25.0000 ug | INTRAMUSCULAR | Status: DC | PRN
  Administered 2015-02-08 (×4): 25 ug via INTRAVENOUS

## 2015-02-08 MED ORDER — LIDOCAINE HCL (CARDIAC) 20 MG/ML IV SOLN
INTRAVENOUS | Status: DC | PRN
  Administered 2015-02-08: 50 mg via INTRAVENOUS

## 2015-02-08 MED ORDER — ROCURONIUM BROMIDE 100 MG/10ML IV SOLN
INTRAVENOUS | Status: DC | PRN
  Administered 2015-02-08: 40 mg via INTRAVENOUS

## 2015-02-08 MED ORDER — PROPOFOL 10 MG/ML IV BOLUS
INTRAVENOUS | Status: DC | PRN
  Administered 2015-02-08: 200 mg via INTRAVENOUS

## 2015-02-08 MED ORDER — NEOSTIGMINE METHYLSULFATE 10 MG/10ML IV SOLN
INTRAVENOUS | Status: DC | PRN
  Administered 2015-02-08: 3 mg via INTRAVENOUS

## 2015-02-08 MED ORDER — HYDROMORPHONE HCL 1 MG/ML IJ SOLN: 0.2500 mg | INTRAMUSCULAR | Status: DC | PRN

## 2015-02-08 MED ORDER — IBUPROFEN 600 MG PO TABS: 600.0000 mg | ORAL_TABLET | Freq: Four times a day (QID) | ORAL | Status: DC | PRN

## 2015-02-08 MED ORDER — FENTANYL CITRATE (PF) 100 MCG/2ML IJ SOLN
INTRAMUSCULAR | Status: DC | PRN
  Administered 2015-02-08: 50 ug via INTRAVENOUS
  Administered 2015-02-08: 150 ug via INTRAVENOUS

## 2015-02-08 MED ORDER — GLYCOPYRROLATE 0.2 MG/ML IJ SOLN
INTRAMUSCULAR | Status: DC | PRN
  Administered 2015-02-08: 0.6 mg via INTRAVENOUS

## 2015-02-08 MED ORDER — MIDAZOLAM HCL 2 MG/2ML IJ SOLN
INTRAMUSCULAR | Status: DC | PRN
  Administered 2015-02-08: 2 mg via INTRAVENOUS

## 2015-02-08 MED ORDER — PHENYLEPHRINE HCL 10 MG/ML IJ SOLN
INTRAMUSCULAR | Status: DC | PRN
  Administered 2015-02-08: 100 ug via INTRAVENOUS

## 2015-02-08 MED ORDER — DEXAMETHASONE SODIUM PHOSPHATE 4 MG/ML IJ SOLN
INTRAMUSCULAR | Status: DC | PRN
  Administered 2015-02-08: 5 mg via INTRAVENOUS

## 2015-02-08 MED ORDER — ONDANSETRON HCL 4 MG/2ML IJ SOLN
INTRAMUSCULAR | Status: DC | PRN
  Administered 2015-02-08: 4 mg via INTRAVENOUS

## 2015-02-08 MED ORDER — FAMOTIDINE 20 MG PO TABS
20.0000 mg | ORAL_TABLET | Freq: Once | ORAL | Status: AC
  Administered 2015-02-08: 20 mg via ORAL

## 2015-02-08 MED ORDER — CHLORHEXIDINE GLUCONATE 4 % EX LIQD: Freq: Once | CUTANEOUS | Status: DC

## 2015-02-08 MED ORDER — ONDANSETRON HCL 4 MG/2ML IJ SOLN: 4.0000 mg | Freq: Once | INTRAMUSCULAR | Status: DC | PRN

## 2015-02-08 MED ORDER — OXYCODONE-ACETAMINOPHEN 5-325 MG PO TABS
1.0000 | ORAL_TABLET | Freq: Four times a day (QID) | ORAL | Status: DC | PRN
  Administered 2015-02-08: 1 via ORAL

## 2015-02-08 MED ORDER — OXYCODONE-ACETAMINOPHEN 5-325 MG PO TABS: 1.0000 | ORAL_TABLET | Freq: Four times a day (QID) | ORAL | Status: DC | PRN

## 2015-02-08 MED ORDER — LACTATED RINGERS IV SOLN
INTRAVENOUS | Status: DC
  Administered 2015-02-08: 13:00:00 via INTRAVENOUS

## 2015-02-08 SURGICAL SUPPLY — 30 items
BLADE SURG SZ11 CARB STEEL (BLADE) ×3 IMPLANT
CANISTER SUCT 1200ML W/VALVE (MISCELLANEOUS) ×3 IMPLANT
CATH ROBINSON RED A/P 16FR (CATHETERS) ×3 IMPLANT
CHLORAPREP W/TINT 26ML (MISCELLANEOUS) ×3 IMPLANT
DRSG TEGADERM 2-3/8X2-3/4 SM (GAUZE/BANDAGES/DRESSINGS) ×3 IMPLANT
GAUZE SPONGE NON-WVN 2X2 STRL (MISCELLANEOUS) ×1 IMPLANT
GLOVE BIO SURGEON STRL SZ7 (GLOVE) ×9 IMPLANT
GLOVE INDICATOR 7.5 STRL GRN (GLOVE) ×9 IMPLANT
GOWN STRL REUS W/ TWL LRG LVL3 (GOWN DISPOSABLE) ×2 IMPLANT
GOWN STRL REUS W/TWL LRG LVL3 (GOWN DISPOSABLE) ×4
IRRIGATION STRYKERFLOW (MISCELLANEOUS) ×1 IMPLANT
IRRIGATOR STRYKERFLOW (MISCELLANEOUS) ×3
IV LACTATED RINGERS 1000ML (IV SOLUTION) ×3 IMPLANT
KIT RM TURNOVER CYSTO AR (KITS) ×3 IMPLANT
LABEL OR SOLS (LABEL) ×3 IMPLANT
LIQUID BAND (GAUZE/BANDAGES/DRESSINGS) ×3 IMPLANT
NS IRRIG 500ML POUR BTL (IV SOLUTION) ×3 IMPLANT
PACK GYN LAPAROSCOPIC (MISCELLANEOUS) ×3 IMPLANT
PAD OB MATERNITY 4.3X12.25 (PERSONAL CARE ITEMS) ×3 IMPLANT
PAD PREP 24X41 OB/GYN DISP (PERSONAL CARE ITEMS) ×3 IMPLANT
SCISSORS METZENBAUM CVD 33 (INSTRUMENTS) IMPLANT
SHEARS HARMONIC ACE PLUS 36CM (ENDOMECHANICALS) ×3 IMPLANT
SLEEVE ENDOPATH XCEL 5M (ENDOMECHANICALS) IMPLANT
SPONGE VERSALON 2X2 STRL (MISCELLANEOUS) ×2
SUT MNCRL AB 4-0 PS2 18 (SUTURE) IMPLANT
SUT VIC AB 0 CT2 27 (SUTURE) IMPLANT
SYRINGE 10CC LL (SYRINGE) ×3 IMPLANT
TROCAR ENDO BLADELESS 11MM (ENDOMECHANICALS) IMPLANT
TROCAR XCEL NON-BLD 5MMX100MML (ENDOMECHANICALS) ×3 IMPLANT
TUBING INSUFFLATOR HI FLOW (MISCELLANEOUS) ×3 IMPLANT

## 2015-02-08 NOTE — Transfer of Care (Signed)
Immediate Anesthesia Transfer of Care Note  Patient: Melissa Mcmahon  Procedure(s) Performed: Procedure(s): LAPAROSCOPY DIAGNOSTIC (N/A)  Patient Location:     Anesthesia Type:General  Level of Consciousness: awake, oriented and patient cooperative  Airway & Oxygen Therapy: Patient Spontanous Breathing and Patient connected to face mask oxygen  Post-op Assessment: Report given to RN and Post -op Vital signs reviewed and stable  Post vital signs: Reviewed  Last Vitals:  Filed Vitals:   02/08/15 1440  BP: 152/99  Pulse: 75  Temp: 36.4 C  Resp: 21    Complications: No apparent anesthesia complications

## 2015-02-08 NOTE — Anesthesia Preprocedure Evaluation (Signed)
Anesthesia Evaluation  Patient identified by MRN, date of birth, ID band Patient awake    Reviewed: Allergy & Precautions, H&P , NPO status , Patient's Chart, lab work & pertinent test results, reviewed documented beta blocker date and time   Airway Mallampati: II  TM Distance: >3 FB Neck ROM: full    Dental  (+) Teeth Intact, Chipped Bilateral upper incisors with serrated appearance and chips. :   Pulmonary neg pulmonary ROS, asthma , Current Smoker,    Pulmonary exam normal        Cardiovascular negative cardio ROS Normal cardiovascular exam Rhythm:regular Rate:Normal     Neuro/Psych  Headaches, negative neurological ROS  negative psych ROS   GI/Hepatic negative GI ROS, Neg liver ROS,   Endo/Other  negative endocrine ROS  Renal/GU negative Renal ROS  negative genitourinary   Musculoskeletal   Abdominal   Peds  Hematology negative hematology ROS (+)   Anesthesia Other Findings   Reproductive/Obstetrics negative OB ROS                             Anesthesia Physical Anesthesia Plan  ASA: II  Anesthesia Plan: General ETT   Post-op Pain Management:    Induction:   Airway Management Planned:   Additional Equipment:   Intra-op Plan:   Post-operative Plan:   Informed Consent: I have reviewed the patients History and Physical, chart, labs and discussed the procedure including the risks, benefits and alternatives for the proposed anesthesia with the patient or authorized representative who has indicated his/her understanding and acceptance.     Plan Discussed with: CRNA  Anesthesia Plan Comments:         Anesthesia Quick Evaluation

## 2015-02-08 NOTE — H&P (Signed)
Initial paper  H&P history reviewed, patient examined, no change in status, stable for surgery.

## 2015-02-08 NOTE — Anesthesia Postprocedure Evaluation (Signed)
  Anesthesia Post-op Note  Patient: Melissa Mcmahon  Procedure(s) Performed: Procedure(s): LAPAROSCOPY DIAGNOSTIC (N/A)  Anesthesia type:General ETT  Patient location: PACU  Post pain: Pain level controlled  Post assessment: Post-op Vital signs reviewed, Patient's Cardiovascular Status Stable, Respiratory Function Stable, Patent Airway and No signs of Nausea or vomiting  Post vital signs: Reviewed and stable  Last Vitals:  Filed Vitals:   02/08/15 1600  BP: 127/74  Pulse: 65  Temp:   Resp: 20    Level of consciousness: awake, alert  and patient cooperative  Complications: No apparent anesthesia complications

## 2015-02-08 NOTE — Anesthesia Procedure Notes (Signed)
Procedure Name: Intubation Performed by: Calissa Swenor Pre-anesthesia Checklist: Patient identified, Patient being monitored, Timeout performed, Emergency Drugs available and Suction available Patient Re-evaluated:Patient Re-evaluated prior to inductionOxygen Delivery Method: Circle system utilized Preoxygenation: Pre-oxygenation with 100% oxygen Intubation Type: IV induction Ventilation: Mask ventilation without difficulty Laryngoscope Size: Miller and 2 Grade View: Grade I Tube type: Oral Tube size: 7.0 mm Number of attempts: 1 Airway Equipment and Method: Stylet Placement Confirmation: ETT inserted through vocal cords under direct vision,  positive ETCO2 and breath sounds checked- equal and bilateral Secured at: 21 cm Tube secured with: Tape Dental Injury: Teeth and Oropharynx as per pre-operative assessment        

## 2015-02-08 NOTE — Op Note (Signed)
Preoperative Diagnosis: 1) 24 y.o. with chronic pelvic pain  Postoperative Diagnosis: 1) 24 y.o. with chronic pelvic pain  Operation Performed: Diagnostic laparoscopy  Indication: 24 y.o. with chronic pelvic pain, concern for endometriosis  Anesthesia: General  Preoperative Antibiotics: none  Estimated Blood Loss: minimal  IV Fluids: 700 mL crystaloid  Urine Output:: 30 mL  Drains or Tubes: none  Implants: none  Specimens Removed: none  Complications: none  Intraoperative Findings: Normal tubes, ovaries, and uterus.  Normal course and caliber of both ureters.  Normal posterior & anterior cul de sac and bilateral ovarian fossas.  Appendix surgically absent.  Normal liver edge.  No hernias.  Patient Condition: stable  Procedure in Detail:  Patient was taken to the operating room where she was administered general anesthesia.  She was positioned in the dorsal lithotomy position utilizing Allen stirups, prepped and draped in the usual sterile fashion.  Prior to proceeding with procedure a time out was performed.  Attention was turned to the patient's pelvis.  A red rubber catheter was used to empty the patient's bladder.  An operative speculum was placed to allow visualization of the cervix.  The anterior lip of the cervix was grasped with a single tooth tenaculum, and a Hulka tenaculum was placed to allow manipulation of the uterus.  The operative speculum and single tooth tenaculum were then removed.  Attention was turned to the patient's abdomen.  The umbilicus was infiltrated with 1% Sensorcaine, before making a stab incision using an 11 blade scalpel.  A 5mm Excel trocar was then used to gain direct entry into the peritoneal cavity utilizing the camera to visualize progress of the trocar during placement.  Once peritoneal entry had been achieved, insufflation was started and pneumoperitoneum established at a pressure of .   General inspection of the abdomen revealed the above  noted findings.  A second 5mm left lower quadrant port was placed under direct visualization to allow manipulation of the ovaries and clearly visualize the ovarian fosa.  Pneumoperitoneum was evacuated.  The trocars were removed.  All trocar sites were then dressed with surgical skin glue.  The Hulka tenaculum was removed.  Sponge needle and instrument counts were correct time two.  The patient tolerated the procedure well and was taken to the recovery room in stable condition.

## 2015-02-09 ENCOUNTER — Encounter: Payer: Self-pay | Admitting: Obstetrics and Gynecology

## 2015-02-15 NOTE — Addendum Note (Signed)
Addendum  created 02/15/15 1117 by Mathews ArgyleBenjamin Maryclaire Stoecker, CRNA   Modules edited: Anesthesia Responsible Staff

## 2016-12-24 IMAGING — CT CT ABD-PELV W/ CM
2 of 4 series · 17 of 46 positions shown, 19 images · IV contrast (Omni 300)
Comparison: 08/23/2013

CLINICAL DATA: Right upper abdominal pain.

EXAM:
CT ABDOMEN AND PELVIS WITH CONTRAST
TECHNIQUE: Multidetector CT imaging of the abdomen and pelvis was performed
using the standard protocol following bolus administration of
intravenous contrast.
CONTRAST:  25mL OMNIPAQUE IOHEXOL 300 MG/ML SOLN, 100mL OMNIPAQUE
IOHEXOL 300 MG/ML SOLN

[Series 2: abd/ pelvis 5.0 i30f 1 · axial · 0.80mm/px · z∈[-686,-291]mm · 14 of 87 slices shown, 16 images]
[im 4/87  soft-tissue]
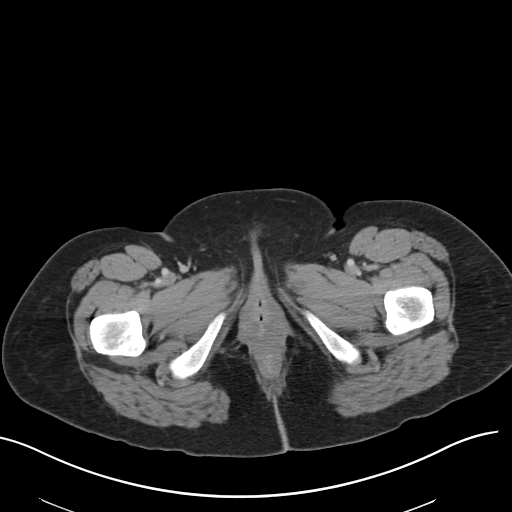
[im 4/87  bone]
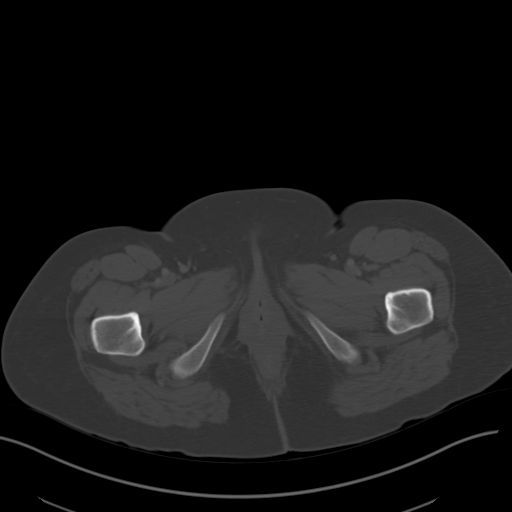
[im 11/87  soft-tissue]
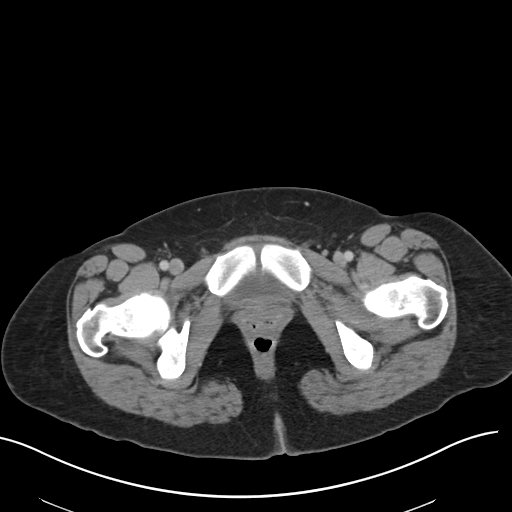
[im 18/87  soft-tissue]
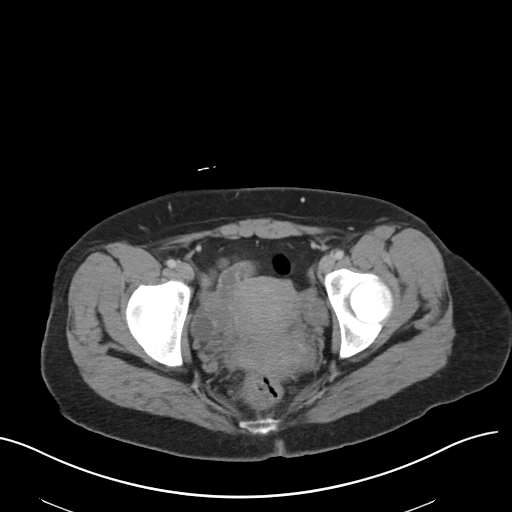
[im 25/87  soft-tissue]
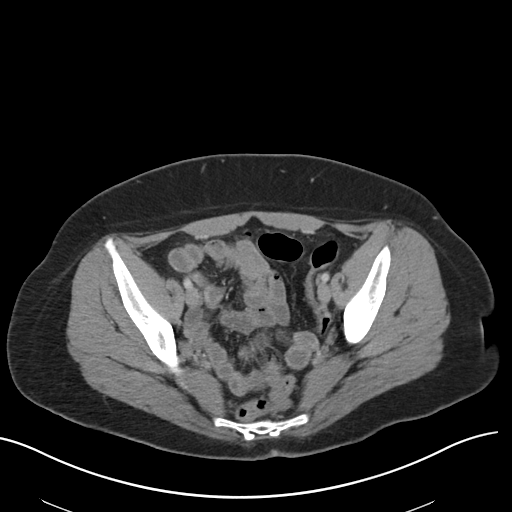
[im 28/87  soft-tissue]
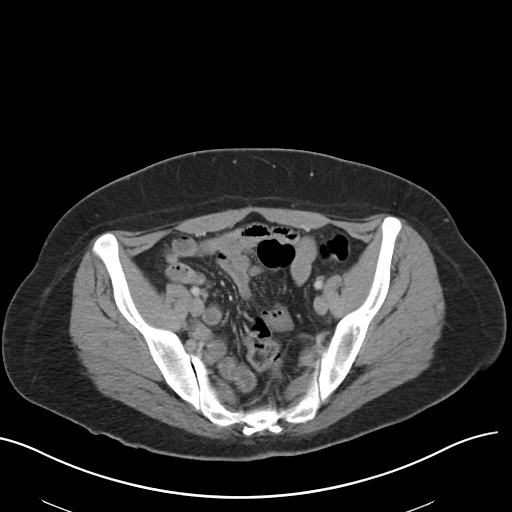
[im 35/87  soft-tissue]
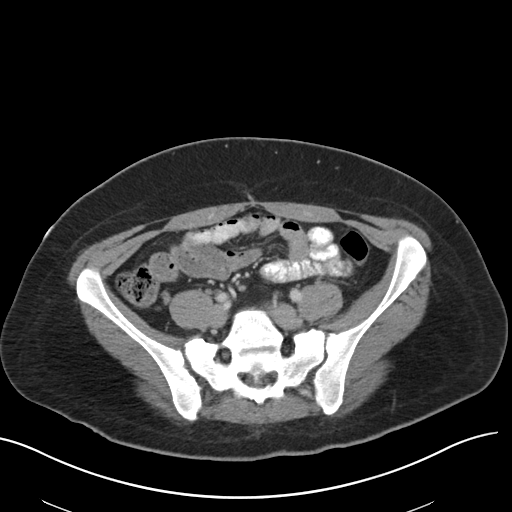
[im 42/87  soft-tissue]
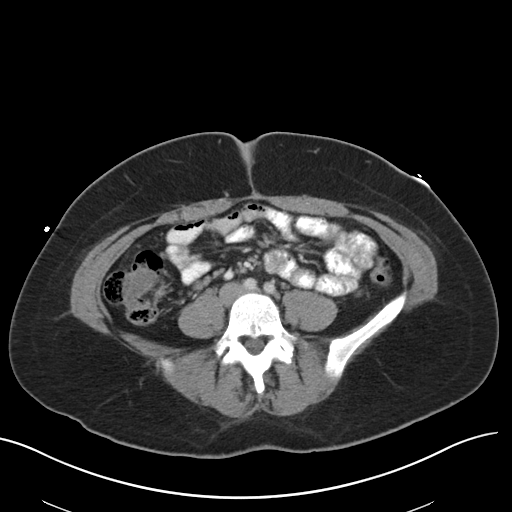
[im 45/87  soft-tissue]
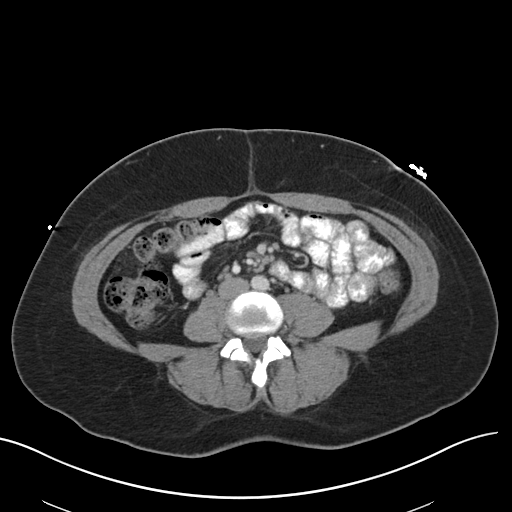
[im 52/87  soft-tissue]
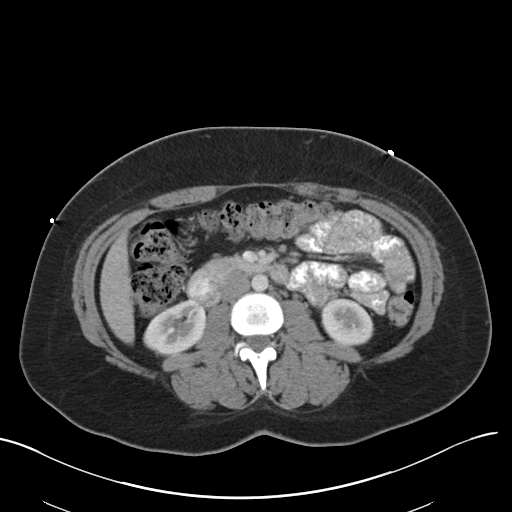
[im 52/87  bone]
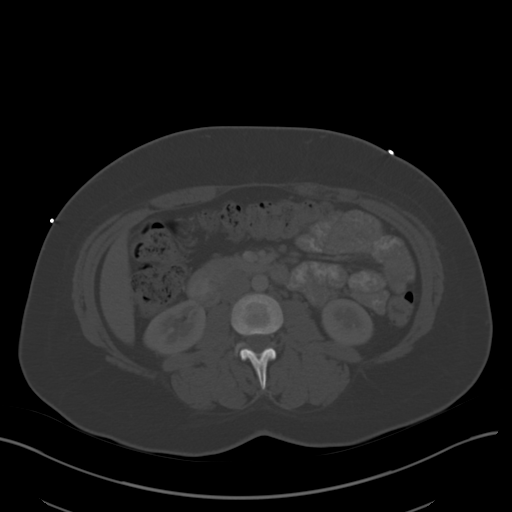
[im 59/87  soft-tissue]
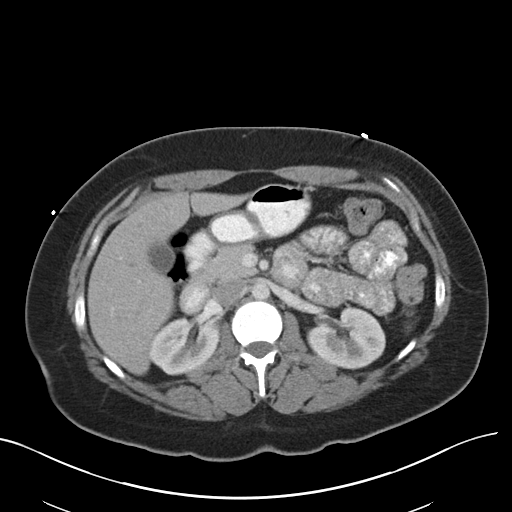
[im 66/87  soft-tissue]
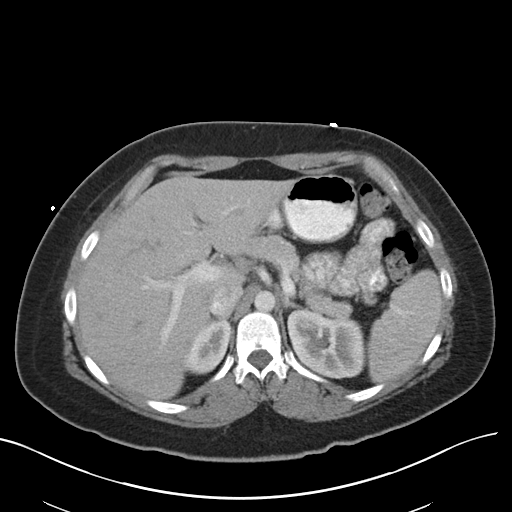
[im 69/87  soft-tissue]
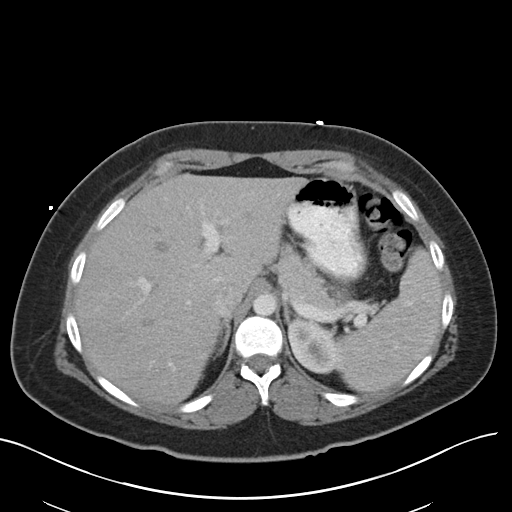
[im 76/87  soft-tissue]
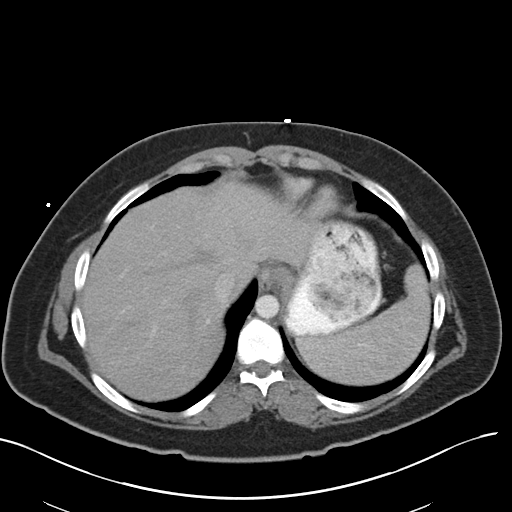
[im 83/87  soft-tissue]
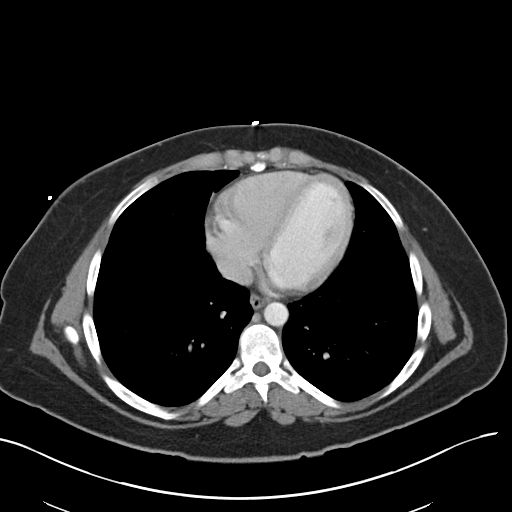

[Series 5: coronals · coronal · 0.75mm/px · 3 of 141 slices shown]
[im 47/141  soft-tissue]
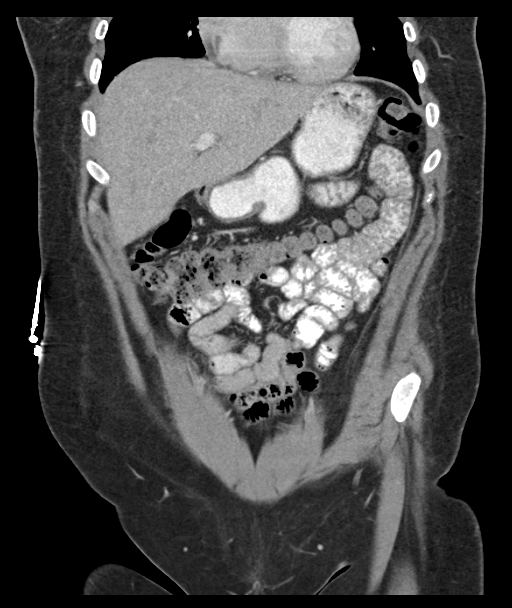
[im 63/141  soft-tissue]
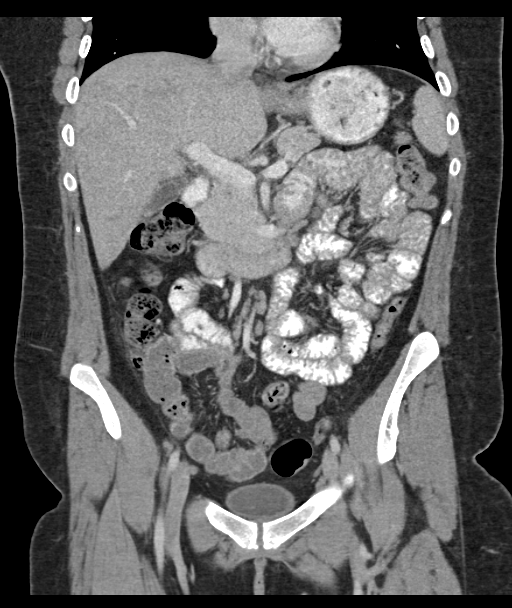
[im 78/141  soft-tissue]
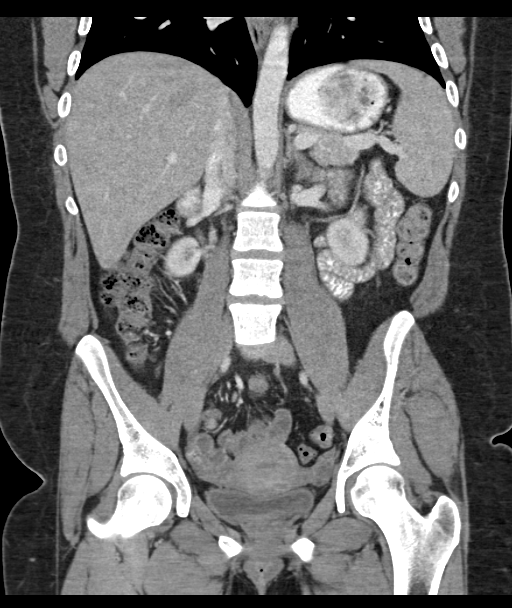

[17 of 46 positions shown; findings below may reference images not displayed]

FINDINGS: Lung bases are clear.

The liver, spleen, gallbladder, pancreas, adrenal glands, kidneys,
abdominal aorta, inferior vena cava, and retroperitoneal lymph nodes
are unremarkable. Stomach, small bowel, and colon are not abnormally
distended. No discrete wall thickening is appreciated. Stool fills
the colon. No free air or free fluid in the abdomen. Scattered
mesenteric lymph nodes are not pathologically enlarged.

Pelvis: Appendix is normal. Uterus and ovaries are not enlarged.
Bladder wall is not thickened. No free or loculated pelvic fluid
collections. No pelvic mass or lymphadenopathy. Mild degenerative
changes in the lower thoracic spine. No destructive bone lesions.
IMPRESSION: No acute process demonstrated in the abdomen or pelvis. Probable
reactive lymph nodes in the mesentery.

## 2017-01-19 ENCOUNTER — Encounter (HOSPITAL_COMMUNITY): Payer: Self-pay | Admitting: *Deleted

## 2017-01-19 ENCOUNTER — Emergency Department (HOSPITAL_COMMUNITY)
Admission: EM | Admit: 2017-01-19 | Discharge: 2017-01-19 | Discharge status: Against Medical Advice | Payer: No Typology Code available for payment source | Attending: Emergency Medicine | Admitting: Emergency Medicine

## 2017-01-19 DIAGNOSIS — R1110 Vomiting, unspecified: Secondary | ICD-10-CM | POA: Insufficient documentation

## 2017-01-19 DIAGNOSIS — R109 Unspecified abdominal pain: Secondary | ICD-10-CM | POA: Insufficient documentation

## 2017-01-19 LAB — LIPASE, BLOOD: Lipase: 24 U/L (ref 11–51)

## 2017-01-19 LAB — COMPREHENSIVE METABOLIC PANEL
ALBUMIN: 3.9 g/dL (ref 3.5–5.0)
ALT: 14 U/L (ref 14–54)
ANION GAP: 5 (ref 5–15)
AST: 17 U/L (ref 15–41)
Alkaline Phosphatase: 56 U/L (ref 38–126)
BILIRUBIN TOTAL: 0.8 mg/dL (ref 0.3–1.2)
BUN: 9 mg/dL (ref 6–20)
CO2: 27 mmol/L (ref 22–32)
Calcium: 9.1 mg/dL (ref 8.9–10.3)
Chloride: 108 mmol/L (ref 101–111)
Creatinine, Ser: 0.74 mg/dL (ref 0.44–1.00)
GFR calc non Af Amer: >60 mL/min (ref >60)
GLUCOSE: 83 mg/dL (ref 65–99)
POTASSIUM: 4.3 mmol/L (ref 3.5–5.1)
SODIUM: 140 mmol/L (ref 135–145)
TOTAL PROTEIN: 6.5 g/dL (ref 6.5–8.1)

## 2017-01-19 LAB — CBC
HEMATOCRIT: 43 % (ref 36.0–46.0)
HEMOGLOBIN: 14.2 g/dL (ref 12.0–15.0)
MCH: 29.8 pg (ref 26.0–34.0)
MCHC: 33 g/dL (ref 30.0–36.0)
MCV: 90.1 fL (ref 78.0–100.0)
Platelets: 298 10*3/uL (ref 150–400)
RBC: 4.77 MIL/uL (ref 3.87–5.11)
RDW: 13.6 % (ref 11.5–15.5)
WBC: 12.2 10*3/uL — ABNORMAL HIGH (ref 4.0–10.5)

## 2017-01-19 NOTE — ED Notes (Signed)
This RN called pt x 2 in waiting room, then went outside to find pt sitting on bench.  Pt stated, "I don't want to be seen now."  This RN informed the pt that she had a room waiting for her. Pt stated her ride was coming.

## 2017-01-19 NOTE — ED Triage Notes (Addendum)
To ED for eval of abd burning and vomiting blood this am. States she has been seen by pmd and supposed to have appt with endo but has heard nothing. Starting vomiting this am and told to come to ED. Describes blood as sometimes streaky and darker red. Appears in nad. Tearful. States her PMD is watching her weight due to 50 lb loss in the past 4 months

## 2017-02-16 ENCOUNTER — Other Ambulatory Visit: Payer: Self-pay | Admitting: Internal Medicine

## 2017-02-16 DIAGNOSIS — R1013 Epigastric pain: Secondary | ICD-10-CM

## 2017-02-23 ENCOUNTER — Encounter
Admission: RE | Admit: 2017-02-23 | Discharge: 2017-02-23 | Disposition: A | Discharge status: Home / Self Care | Payer: Medicaid Other | Source: Ambulatory Visit | Attending: Internal Medicine | Admitting: Internal Medicine

## 2017-02-23 DIAGNOSIS — R1013 Epigastric pain: Secondary | ICD-10-CM | POA: Insufficient documentation

## 2017-02-23 MED ORDER — TECHNETIUM TC 99M MEBROFENIN IV KIT
5.0000 | PACK | Freq: Once | INTRAVENOUS | Status: AC | PRN
  Administered 2017-02-23: 5.45 via INTRAVENOUS

## 2017-05-13 ENCOUNTER — Emergency Department
Admission: EM | Admit: 2017-05-13 | Discharge: 2017-05-13 | Disposition: A | Discharge status: Home / Self Care | Payer: Medicaid Other | Attending: Student in an Organized Health Care Education/Training Program | Admitting: Student in an Organized Health Care Education/Training Program

## 2017-05-13 ENCOUNTER — Encounter: Payer: Self-pay | Admitting: Emergency Medicine

## 2017-05-13 ENCOUNTER — Other Ambulatory Visit: Payer: Self-pay

## 2017-05-13 DIAGNOSIS — G43101 Migraine with aura, not intractable, with status migrainosus: Secondary | ICD-10-CM | POA: Insufficient documentation

## 2017-05-13 DIAGNOSIS — F17210 Nicotine dependence, cigarettes, uncomplicated: Secondary | ICD-10-CM | POA: Insufficient documentation

## 2017-05-13 MED ORDER — SODIUM CHLORIDE 0.9 % IV BOLUS (SEPSIS)
1000.0000 mL | Freq: Once | INTRAVENOUS | Status: AC
  Administered 2017-05-13: 1000 mL via INTRAVENOUS

## 2017-05-13 MED ORDER — DIPHENHYDRAMINE HCL 50 MG/ML IJ SOLN
25.0000 mg | Freq: Once | INTRAMUSCULAR | Status: AC
  Administered 2017-05-13: 25 mg via INTRAVENOUS
  Filled 2017-05-13: qty 1

## 2017-05-13 MED ORDER — ONDANSETRON 4 MG PO TBDP: 4.0000 mg | ORAL_TABLET | Freq: Three times a day (TID) | ORAL | 0 refills | Status: DC | PRN

## 2017-05-13 MED ORDER — KETOROLAC TROMETHAMINE 30 MG/ML IJ SOLN
30.0000 mg | Freq: Once | INTRAMUSCULAR | Status: AC
  Administered 2017-05-13: 30 mg via INTRAVENOUS
  Filled 2017-05-13: qty 1

## 2017-05-13 MED ORDER — ONDANSETRON HCL 4 MG/2ML IJ SOLN
4.0000 mg | Freq: Once | INTRAMUSCULAR | Status: AC
  Administered 2017-05-13: 4 mg via INTRAVENOUS
  Filled 2017-05-13: qty 2

## 2017-05-13 NOTE — Discharge Instructions (Signed)
Follow-up with your regular doctor if you are not improving in 3-5 days, if you are worsening please return to the emergency department take over-the-counter medications as needed, drink plenty of fluids, you have been given a prescription for Zofran if you have nausea vomiting, follow-up with a neurologist you are originally instructed to follow-up with

## 2017-05-13 NOTE — ED Provider Notes (Signed)
[facility] Emergency Department Provider Note  ____________________________________________   First MD Initiated Contact with Patient 05/13/17 1433     (approximate)  I have reviewed the triage vital signs and the nursing notes.   HISTORY  Chief Complaint Headache    HPI Melissa Mcmahon is a 27 y.o. female complaining of migraine headache, she states she has a history of migraines, this time she is a little dizzy, she is sensitive to light and sound, some nausea and vomiting, she denies fever or chills, there is no difference in this headache from her others other than the dizziness, she denies chest pain or shortness of breath  Past Medical History:  Diagnosis Date  . Asthma    H/O AS A CHILD-NO INHALERS   . Endometriosis   . Headache    MIGRAINES-RARE    There are no active problems to display for this patient.   Past Surgical History:  Procedure Laterality Date  . FEET SURGERY    . KNEE ARTHROSCOPY    . LAPAROSCOPY N/A 02/08/2015   Procedure: LAPAROSCOPY DIAGNOSTIC;  Surgeon: Vena AustriaAndreas Staebler, MD;  Location: [facility] ORS;  Service: Gynecology;  Laterality: N/A;  . TUBAL LIGATION      Prior to Admission medications   Medication Sig Start Date End Date Taking? Authorizing Provider  ondansetron (ZOFRAN-ODT) 4 MG disintegrating tablet Take 1 tablet (4 mg total) by mouth every 8 (eight) hours as needed for nausea or vomiting. 05/13/17   Faythe GheeFisher, Daril Warga W, PA-C    Allergies Penicillins  History reviewed. No pertinent family history.  Social History Social History   Tobacco Use  . Smoking status: Current Every Day Smoker    Packs/day: 1.00    Years: 7.00    Pack years: 7.00    Types: Cigarettes  . Smokeless tobacco: Never Used  Substance Use Topics  . Alcohol use: No  . Drug use: No    Review of Systems  Constitutional: No fever/chills, positive headache Eyes: No visual changes.  Positive light sensitivity ENT: No sore  throat. Respiratory: Denies cough Genitourinary: Negative for dysuria. Musculoskeletal: Negative for back pain. Skin: Negative for rash.    ____________________________________________   PHYSICAL EXAM:  VITAL SIGNS: ED Triage Vitals  Enc Vitals Group     BP 05/13/17 1417 121/76     Pulse Rate 05/13/17 1417 89     Resp 05/13/17 1417 16     Temp 05/13/17 1417 98.5 F (36.9 C)     Temp Source 05/13/17 1417 Oral     SpO2 05/13/17 1417 100 %     Weight 05/13/17 1417 138 lb (62.6 kg)     Height 05/13/17 1417 5\' 3"  (1.6 m)     Head Circumference --      Peak Flow --      Pain Score 05/13/17 1421 7     Pain Loc --      Pain Edu? --      Excl. in GC? --     Constitutional: Alert and oriented. Well appearing and in no acute distress. Eyes: Conjunctivae are normal. perrl Head: Atraumatic. Nose: No congestion/rhinnorhea. Mouth/Throat: Mucous membranes are moist.   Cardiovascular: Normal rate, regular rhythm.  Heart sounds are normal Respiratory: Normal respiratory effort.  No retractions, lungs are clear to auscultation GU: deferred Musculoskeletal: FROM all extremities, warm and well perfused Neurologic:  Normal speech and language.  Cranial nerves II through XII grossly intact Skin:  Skin is warm, dry and intact. No  rash noted. Psychiatric: Mood and affect are normal. Speech and behavior are normal.  ____________________________________________   LABS (all labs ordered are listed, but only abnormal results are displayed)  Labs Reviewed - No data to display ____________________________________________   ____________________________________________  RADIOLOGY    ____________________________________________   PROCEDURES  Procedure(s) performed:   IV normal saline 1 L, Toradol 30 mg IV, Benadryl 25 mg IV, Zofran 4 mg IV    ____________________________________________   INITIAL IMPRESSION / ASSESSMENT AND PLAN / ED COURSE  Pertinent labs & imaging results  that were available during my care of the patient were reviewed by me and considered in my medical decision making (see chart for details).  Patient is a 27 year old female complaining of migraine headache, she states she has a history of migraine headaches, the only difference with this migraine is that she is dizzy and has some vomiting, she denies fever chills  On physical exam patient is sensitive to sound and light, she appears to be in pain, otherwise the exam is benign  Saline 1 L IV, Toradol 30 mg IV Benadryl 25 mg IV and Zofran 4 mg IV was given to the patient, about 30 minutes afterwards she states she does feel better, her friend that is with her states she is sleeping easily  Patient has improvement so she will be discharged, she is instructed to drink plenty of fluids, get plenty of rest, and take any medications that she has been prescribed     As part of my medical decision making, I reviewed the following data within the electronic MEDICAL RECORD NUMBER Old chart reviewed, Notes from prior ED visits and [place] Controlled Substance Database  ____________________________________________   FINAL CLINICAL IMPRESSION(S) / ED DIAGNOSES  Final diagnoses:  Migraine with aura and with status migrainosus, not intractable      NEW MEDICATIONS STARTED DURING THIS VISIT:  This SmartLink is deprecated. Use AVSMEDLIST instead to display the medication list for a patient.   Note:  This document was prepared using Dragon voice recognition software and may include unintentional dictation errors.    Faythe Ghee, PA-C 05/13/17 1552    Willy Eddy, MD 05/13/17 [phone]

## 2017-05-13 NOTE — ED Triage Notes (Signed)
Pt to ed with c/o headache since last night, worse 2 hours ago.  Pt states she does not take meds for headaches. Reports hx of migraines.  +nausea/vomiting.

## 2017-05-13 NOTE — ED Notes (Signed)
FN: headache for two days.

## 2017-07-21 ENCOUNTER — Encounter: Payer: Self-pay | Admitting: Emergency Medicine

## 2017-07-21 ENCOUNTER — Other Ambulatory Visit: Payer: Self-pay

## 2017-07-21 ENCOUNTER — Emergency Department: Payer: Self-pay

## 2017-07-21 DIAGNOSIS — R079 Chest pain, unspecified: Secondary | ICD-10-CM | POA: Insufficient documentation

## 2017-07-21 DIAGNOSIS — J45909 Unspecified asthma, uncomplicated: Secondary | ICD-10-CM | POA: Insufficient documentation

## 2017-07-21 DIAGNOSIS — R42 Dizziness and giddiness: Secondary | ICD-10-CM | POA: Insufficient documentation

## 2017-07-21 DIAGNOSIS — F17210 Nicotine dependence, cigarettes, uncomplicated: Secondary | ICD-10-CM | POA: Insufficient documentation

## 2017-07-21 DIAGNOSIS — R51 Headache: Secondary | ICD-10-CM | POA: Insufficient documentation

## 2017-07-21 LAB — CBC
HCT: 45.3 % (ref 35.0–47.0)
HEMOGLOBIN: 14.7 g/dL (ref 12.0–16.0)
MCH: 28.8 pg (ref 26.0–34.0)
MCHC: 32.4 g/dL (ref 32.0–36.0)
MCV: 88.7 fL (ref 80.0–100.0)
PLATELETS: 312 10*3/uL (ref 150–440)
RBC: 5.1 MIL/uL (ref 3.80–5.20)
RDW: 14.3 % (ref 11.5–14.5)
WBC: 7.9 10*3/uL (ref 3.6–11.0)

## 2017-07-21 LAB — BASIC METABOLIC PANEL
ANION GAP: 10 (ref 5–15)
BUN: 16 mg/dL (ref 6–20)
CHLORIDE: 106 mmol/L (ref 101–111)
CO2: 25 mmol/L (ref 22–32)
CREATININE: 0.73 mg/dL (ref 0.44–1.00)
Calcium: 9.3 mg/dL (ref 8.9–10.3)
GFR calc non Af Amer: >60 mL/min (ref >60)
Glucose, Bld: 96 mg/dL (ref 65–99)
Potassium: 4.2 mmol/L (ref 3.5–5.1)
SODIUM: 141 mmol/L (ref 135–145)

## 2017-07-21 LAB — TROPONIN I

## 2017-07-21 NOTE — ED Triage Notes (Signed)
Patient ambulatory to triage with steady gait, without difficulty or distress noted; pt reports "for awhile" having intermittent episodes of CP radiating into shoulders and elbow accomp by dizziness; recently stopped zoloft 8 days

## 2017-07-22 ENCOUNTER — Emergency Department
Admission: EM | Admit: 2017-07-22 | Discharge: 2017-07-22 | Disposition: A | Discharge status: Home / Self Care | Payer: Self-pay | Attending: Emergency Medicine | Admitting: Emergency Medicine

## 2017-07-22 ENCOUNTER — Emergency Department: Payer: Self-pay

## 2017-07-22 DIAGNOSIS — R42 Dizziness and giddiness: Secondary | ICD-10-CM

## 2017-07-22 DIAGNOSIS — R51 Headache: Secondary | ICD-10-CM

## 2017-07-22 DIAGNOSIS — R079 Chest pain, unspecified: Secondary | ICD-10-CM

## 2017-07-22 DIAGNOSIS — R519 Headache, unspecified: Secondary | ICD-10-CM

## 2017-07-22 LAB — TROPONIN I

## 2017-07-22 MED ORDER — DIPHENHYDRAMINE HCL 50 MG/ML IJ SOLN
INTRAMUSCULAR | Status: AC
  Filled 2017-07-22: qty 1

## 2017-07-22 MED ORDER — DIPHENHYDRAMINE HCL 50 MG/ML IJ SOLN
25.0000 mg | Freq: Once | INTRAMUSCULAR | Status: AC
  Administered 2017-07-22: 25 mg via INTRAVENOUS

## 2017-07-22 MED ORDER — KETOROLAC TROMETHAMINE 30 MG/ML IJ SOLN
30.0000 mg | Freq: Once | INTRAMUSCULAR | Status: AC
  Administered 2017-07-22: 30 mg via INTRAVENOUS

## 2017-07-22 MED ORDER — KETOROLAC TROMETHAMINE 60 MG/2ML IM SOLN
INTRAMUSCULAR | Status: AC
  Filled 2017-07-22: qty 2

## 2017-07-22 MED ORDER — METOCLOPRAMIDE HCL 5 MG/ML IJ SOLN
10.0000 mg | Freq: Once | INTRAMUSCULAR | Status: AC
  Administered 2017-07-22: 10 mg via INTRAVENOUS
  Filled 2017-07-22: qty 2

## 2017-07-22 MED ORDER — BUTALBITAL-APAP-CAFFEINE 50-325-40 MG PO TABS: 1.0000 | ORAL_TABLET | Freq: Four times a day (QID) | ORAL | 0 refills | Status: DC | PRN

## 2017-07-22 MED ORDER — DIPHENHYDRAMINE HCL 50 MG/ML IJ SOLN
25.0000 mg | Freq: Once | INTRAMUSCULAR | Status: AC
  Administered 2017-07-22: 25 mg via INTRAVENOUS
  Filled 2017-07-22: qty 1

## 2017-07-22 MED ORDER — SODIUM CHLORIDE 0.9 % IV BOLUS (SEPSIS)
1000.0000 mL | Freq: Once | INTRAVENOUS | Status: AC
  Administered 2017-07-22: 1000 mL via INTRAVENOUS

## 2017-07-22 MED ORDER — LORAZEPAM 0.5 MG PO TABS
0.5000 mg | ORAL_TABLET | Freq: Once | ORAL | Status: DC
  Filled 2017-07-22: qty 1

## 2017-07-22 MED ORDER — GI COCKTAIL ~~LOC~~
30.0000 mL | Freq: Once | ORAL | Status: AC
  Administered 2017-07-22: 30 mL via ORAL
  Filled 2017-07-22: qty 30

## 2017-07-22 NOTE — Discharge Instructions (Signed)
Please follow-up with your primary care physician as well as with neurology.  Please continue hydrating with water and discuss your medications with your physician.  Please return with any worsening condition or any other concerns.

## 2017-07-22 NOTE — ED Provider Notes (Signed)
[facility] Emergency Department Provider Note   ____________________________________________ First MD Initiated Contact with Patient 07/22/17 0207     (approximate)  I have reviewed the triage vital signs and the nursing notes.   HISTORY  Chief Complaint Chest Pain    HPI Melissa Mcmahon is a 27 y.o. female who comes into the hospital today with multiple complaints.  The patient is having some chest pain, headache and shortness of breath.  The patient reports that she felt like she was in a pass out earlier today and has been feeling dizzy.  The patient has been having these symptoms on and off for about a month.  The patient has some bad anxiety but she has been trying to wean herself off of some Zoloft.  The patient is not following with her doctor and is not doing this under the care of a physician.  The patient states that she initially felt that this was symptoms of her coming off of her medication but since they were persistent she decided to come and get checked out.  The patient has not been taking anything for the chest pain or headache.  She reports that his mid chest pain that is dull and is all the time.  She has some occasional sharp pain with it and some shoulder pain.  She will also get suddenly tired whenever she gets this pain.  The patient rates her pain a 6 out of 10 in intensity currently.  She has been forgetful as well over the last few days.  The patient is unsure exactly what is going on but she wanted to come and get checked out. Nothing seems to make her pain better or worse.      Past Medical History:  Diagnosis Date  . Asthma    H/O AS A CHILD-NO INHALERS   . Endometriosis   . Headache    MIGRAINES-RARE    There are no active problems to display for this patient.        Past Surgical History:  Procedure Laterality Date  . FEET SURGERY    . KNEE ARTHROSCOPY    . LAPAROSCOPY N/A 02/08/2015   Procedure:  LAPAROSCOPY DIAGNOSTIC;  Surgeon: Vena Austria, MD;  Location: [facility] ORS;  Service: Gynecology;  Laterality: N/A;  . TUBAL LIGATION      Prior to Admission medications   Medication Sig Start Date End Date Taking? Authorizing Provider  butalbital-acetaminophen-caffeine (FIORICET, ESGIC) [phone] MG tablet Take 1-2 tablets by mouth every 6 (six) hours as needed for headache. 07/22/17 07/22/18  Rebecka Apley, MD  ondansetron (ZOFRAN-ODT) 4 MG disintegrating tablet Take 1 tablet (4 mg total) by mouth every 8 (eight) hours as needed for nausea or vomiting. 05/13/17   Faythe Ghee, PA-C    Allergies Penicillins  No family history on file.  Social History Social History        Tobacco Use  . Smoking status: Current Every Day Smoker    Packs/day: 1.00    Years: 7.00    Pack years: 7.00    Types: Cigarettes  . Smokeless tobacco: Never Used  Substance Use Topics  . Alcohol use: No  . Drug use: No    Review of Systems  Constitutional: No fever/chills Eyes: No visual changes. ENT: No sore throat. Cardiovascular:  chest pain. Respiratory:  shortness of breath. Gastrointestinal: No abdominal pain.  No nausea, no vomiting.  No diarrhea.  No constipation. Genitourinary: Negative for dysuria. Musculoskeletal: Negative for  back pain. Skin: Negative for rash. Neurological: headaches, and dizziness   ____________________________________________   PHYSICAL EXAM:  VITAL SIGNS:    ED Triage Vitals [07/21/17 2131]  Enc Vitals Group     BP (!) 142/95     Pulse Rate 89     Resp 18     Temp 98 F (36.7 C)     Temp Source Oral     SpO2 100 %     Weight 142 lb (64.4 kg)     Height 5\' 3"  (1.6 m)     Head Circumference      Peak Flow      Pain Score 4     Pain Loc      Pain Edu?      Excl. in GC?     Constitutional: Alert and oriented. Well appearing and in mild distress. Eyes: Conjunctivae are normal. PERRL. EOMI. Head:  Atraumatic. Nose: No congestion/rhinnorhea. Mouth/Throat: Mucous membranes are moist.  Oropharynx non-erythematous. ardiovascular: Normal rate, regular rhythm. Grossly normal heart sounds.  Good peripheral circulation. Respiratory: Normal respiratory effort.  No retractions. Lungs CTAB. Gastrointestinal: Soft and nontender. No distention.  Positive bowel sounds Musculoskeletal: No lower extremity tenderness nor edema.   Neurologic:  Normal speech and language.  Cranial nerves II through XII are grossly intact with no focal motor neuro deficit, sensation intact throughout.  Finger to nose intact, no pronator drift. Skin:  Skin is warm, dry and intact.  Psychiatric: Mood and affect are normal.   ____________________________________________   LABS (all labs ordered are listed, but only abnormal results are displayed)  Labs Reviewed  BASIC METABOLIC PANEL  CBC  TROPONIN I  TROPONIN I   ____________________________________________  EKG  ED ECG REPORT I, Rebecka ApleyWebster,  Ernesto Zukowski P, the attending physician, personally viewed and interpreted this ECG.   Date: 07/21/2017  EKG Time: 2133  Rate: 73  Rhythm: normal sinus rhythm, with sinus arrythmia  Axis: normal  Intervals:none  ST&T Change: no  ____________________________________________  RADIOLOGY  ED MD interpretation:  CXR: no acute disease  CT head: NO acute disease  Official radiology report(s): Dg Chest 2 View  Result Date: 07/21/2017 CLINICAL DATA:  Chest pain EXAM: CHEST - 2 VIEW COMPARISON:  March 02, 2017 FINDINGS: The heart size and mediastinal contours are within normal limits. Both lungs are clear. The visualized skeletal structures are unremarkable. IMPRESSION: No active cardiopulmonary disease. Electronically Signed   By: Sherian ReinWei-Chen  Lin M.D.   On: 07/21/2017 21:54   Ct Head Wo Contrast  Result Date: 07/22/2017 CLINICAL DATA:  Headache, acute, normal neuro exam EXAM: CT HEAD WITHOUT CONTRAST TECHNIQUE:  Contiguous axial images were obtained from the base of the skull through the vertex without intravenous contrast. COMPARISON:  None. FINDINGS: Brain: No intracranial hemorrhage, mass effect, or midline shift. No hydrocephalus. The basilar cisterns are patent. No evidence of territorial infarct or acute ischemia. No extra-axial or intracranial fluid collection. Vascular: No hyperdense vessel or unexpected calcification. Skull: No fracture or focal lesion. Sinuses/Orbits: Opacification of right ethmoid air cells with mucosal thickening of the right maxillary sinus and small right maxillary sinus mucous retention cyst. Other: Scattered subcutaneous calcifications posteriorly may be calcified sebaceous cysts. IMPRESSION: 1.  No acute intracranial abnormality. 2. Mild mucosal thickening of the ethmoid air cells and right maxillary sinus. Electronically Signed   By: Rubye OaksMelanie  Ehinger M.D.   On: 07/22/2017 04:12    ____________________________________________   PROCEDURES  Procedure(s) performed: None  Procedures  Critical Care performed: No  ____________________________________________   INITIAL IMPRESSION / ASSESSMENT AND PLAN / ED COURSE  As part of my medical decision making, I reviewed the following data within the electronic MEDICAL RECORD NUMBER Notes from prior ED visits and [place] Controlled Substance Database   Thank you veryThis is a 27 year old female who comes into the hospital today with some chest pain headache.  The patient has been having these symptoms for over 1 month.  I did check some blood work on the patient to include a CBC BMP in 2 troponins which were all negative.  I also sent the patient for a CT head which was negative aside from some mucosal thickening of the ethmoid sinus and maxillary sinus.  I gave the patient some Reglan, Benadryl, Toradol and a liter of normal saline.  The patient felt very anxious so I then gave her another dose of Benadryl for the Reglan  side effect.  The patient's headache was improved but she states that she was still feeling very anxious and twitchy.  We discussed that her symptoms could possibly be due to her anxiety especially since she is weaning herself off of her Zoloft without informing her physician.  I will give the patient a dose of Ativan 0.5 mg and she will be discharged to follow back up with her primary care physician.  The patient's vital signs are unremarkable.  She will be discharged home.    ____________________________________________   FINAL CLINICAL IMPRESSION(S) / ED DIAGNOSES  Final diagnoses:  Dizziness  Chest pain, unspecified type  Acute nonintractable headache, unspecified headache type          ED Discharge Orders        Ordered    butalbital-acetaminophen-caffeine (FIORICET, ESGIC) 50-325-40 MG tablet  Every 6 hours PRN     07/22/17 0536       Note:  This document was prepared using Dragon voice recognition software and may include unintentional dictation errors.    Rebecka Apley, MD 07/22/17 [phone]

## 2017-08-10 ENCOUNTER — Encounter: Payer: Self-pay | Admitting: Certified Nurse Midwife

## 2017-08-10 ENCOUNTER — Ambulatory Visit (INDEPENDENT_AMBULATORY_CARE_PROVIDER_SITE_OTHER): Payer: Self-pay | Admitting: Certified Nurse Midwife

## 2017-08-10 VITALS — BP 128/88 | HR 67 | Ht 63.0 in | Wt 148.4 lb

## 2017-08-10 DIAGNOSIS — R5383 Other fatigue: Secondary | ICD-10-CM

## 2017-08-10 DIAGNOSIS — R102 Pelvic and perineal pain: Secondary | ICD-10-CM

## 2017-08-10 DIAGNOSIS — N921 Excessive and frequent menstruation with irregular cycle: Secondary | ICD-10-CM

## 2017-08-10 DIAGNOSIS — Z842 Family history of other diseases of the genitourinary system: Secondary | ICD-10-CM

## 2017-08-10 DIAGNOSIS — R42 Dizziness and giddiness: Secondary | ICD-10-CM

## 2017-08-10 DIAGNOSIS — R232 Flushing: Secondary | ICD-10-CM

## 2017-08-10 NOTE — Patient Instructions (Signed)
Endometrial Ablation Endometrial ablation is a procedure that destroys the thin inner layer of the lining of the uterus (endometrium). This procedure may be done:  To stop heavy periods.  To stop bleeding that is causing anemia.  To control irregular bleeding.  To treat bleeding caused by small tumors (fibroids) in the endometrium.  This procedure is often an alternative to major surgery, such as removal of the uterus and cervix (hysterectomy). As a result of this procedure:  You may not be able to have children. However, if you are premenopausal (you have not gone through menopause): ? You may still have a small chance of getting pregnant. ? You will need to use a reliable method of birth control after the procedure to prevent pregnancy.  You may stop having a menstrual period, or you may have only a small amount of bleeding during your period. Menstruation may return several years after the procedure.  Tell a health care provider about:  Any allergies you have.  All medicines you are taking, including vitamins, herbs, eye drops, creams, and over-the-counter medicines.  Any problems you or family members have had with the use of anesthetic medicines.  Any blood disorders you have.  Any surgeries you have had.  Any medical conditions you have. What are the risks? Generally, this is a safe procedure. However, problems may occur, including:  A hole (perforation) in the uterus or bowel.  Infection of the uterus, bladder, or vagina.  Bleeding.  Damage to other structures or organs.  An air bubble in the lung (air embolus).  Problems with pregnancy after the procedure.  Failure of the procedure.  Decreased ability to diagnose cancer in the endometrium.  What happens before the procedure?  You will have tests of your endometrium to make sure there are no pre-cancerous cells or cancer cells present.  You may have an ultrasound of the uterus.  You may be given  medicines to thin the endometrium.  Ask your health care provider about: ? Changing or stopping your regular medicines. This is especially important if you take diabetes medicines or blood thinners. ? Taking medicines such as aspirin and ibuprofen. These medicines can thin your blood. Do not take these medicines before your procedure if your doctor tells you not to.  Plan to have someone take you home from the hospital or clinic. What happens during the procedure?  You will lie on an exam table with your feet and legs supported as in a pelvic exam.  To lower your risk of infection: ? Your health care team will wash or sanitize their hands and put on germ-free (sterile) gloves. ? Your genital area will be washed with soap.  An IV tube will be inserted into one of your veins.  You will be given a medicine to help you relax (sedative).  A surgical instrument with a light and camera (resectoscope) will be inserted into your vagina and moved into your uterus. This allows your surgeon to see inside your uterus.  Endometrial tissue will be removed using one of the following methods: ? Radiofrequency. This method uses a radiofrequency-alternating electric current to remove the endometrium. ? Cryotherapy. This method uses extreme cold to freeze the endometrium. ? Heated-free liquid. This method uses a heated saltwater (saline) solution to remove the endometrium. ? Microwave. This method uses high-energy microwaves to heat up the endometrium and remove it. ? Thermal balloon. This method involves inserting a catheter with a balloon tip into the uterus. The balloon tip is   filled with heated fluid to remove the endometrium. The procedure may vary among health care providers and hospitals. What happens after the procedure?  Your blood pressure, heart rate, breathing rate, and blood oxygen level will be monitored until the medicines you were given have worn off.  As tissue healing occurs, you may  notice vaginal bleeding for 4-6 weeks after the procedure. You may also experience: ? Cramps. ? Thin, watery vaginal discharge that is light pink or brown in color. ? A need to urinate more frequently than usual. ? Nausea.  Do not drive for 24 hours if you were given a sedative.  Do not have sex or insert anything into your vagina until your health care provider approves. Summary  Endometrial ablation is done to treat the many causes of heavy menstrual bleeding.  The procedure may be done only after medications have been tried to control the bleeding.  Plan to have someone take you home from the hospital or clinic. This information is not intended to replace advice given to you by your health care provider. Make sure you discuss any questions you have with your health care provider. Document Released: 02/29/2004 Document Revised: 05/08/2016 Document Reviewed: 05/08/2016 Elsevier Interactive Patient Education  2017 Elsevier Inc. Menorrhagia Menorrhagia is when your menstrual periods are heavy or last longer than usual. Follow these instructions at home:  Only take medicine as told by your doctor.  Take any iron pills as told by your doctor. Heavy bleeding may cause low levels of iron in your body.  Do not take aspirin 1 week before or during your period. Aspirin can make the bleeding worse.  Lie down for a while if you change your tampon or pad more than once in 2 hours. This may help lessen the bleeding.  Eat a healthy diet and foods with iron. These foods include leafy green vegetables, meat, liver, eggs, and whole grain breads and cereals.  Do not try to lose weight. Wait until the heavy bleeding has stopped and your iron level is normal. Contact a doctor if:  You soak through a pad or tampon every 1 or 2 hours, and this happens every time you have a period.  You need to use pads and tampons at the same time because you are bleeding so much.  You need to change your pad or  tampon during the night.  You have a period that lasts for more than 8 days.  You pass clots bigger than 1 inch (2.5 cm) wide.  You have irregular periods that happen more or less often than once a month.  You feel dizzy or pass out (faint).  You feel very weak or tired.  You feel short of breath or feel your heart is beating too fast when you exercise.  You feel sick to your stomach (nausea) and you throw up (vomit) while you are taking your medicine.  You have watery poop (diarrhea) while you are taking your medicine.  You have any problems that may be related to the medicine you are taking. Get help right away if:  You soak through 4 or more pads or tampons in 2 hours.  You have any bleeding while you are pregnant. This information is not intended to replace advice given to you by your health care provider. Make sure you discuss any questions you have with your health care provider. Document Released: 01/29/2008 Document Revised: 09/27/2015 Document Reviewed: 10/21/2012 Elsevier Interactive Patient Education  2017 Elsevier Inc. Abnormal Uterine Bleeding Abnormal  uterine bleeding means bleeding more than usual from your uterus. It can include:  Bleeding between periods.  Bleeding after sex.  Bleeding that is heavier than normal.  Periods that last longer than usual.  Bleeding after you have stopped having your period (menopause).  There are many problems that may cause this. You should see a doctor for any kind of bleeding that is not normal. Treatment depends on the cause of the bleeding. Follow these instructions at home:  Watch your condition for any changes.  Do not use tampons, douche, or have sex, if your doctor tells you not to.  Change your pads often.  Get regular well-woman exams. Make sure they include a pelvic exam and cervical cancer screening.  Keep all follow-up visits as told by your doctor. This is important. Contact a doctor if:  The bleeding  lasts more than one week.  You feel dizzy at times.  You feel like you are going to throw up (nauseous).  You throw up. Get help right away if:  You pass out.  You have to change pads every hour.  You have belly (abdominal) pain.  You have a fever.  You get sweaty.  You get weak.  You passing large blood clots from your vagina. Summary  Abnormal uterine bleeding means bleeding more than usual from your uterus.  There are many problems that may cause this. You should see a doctor for any kind of bleeding that is not normal.  Treatment depends on the cause of the bleeding. This information is not intended to replace advice given to you by your health care provider. Make sure you discuss any questions you have with your health care provider. Document Released: 02/16/2009 Document Revised: 04/15/2016 Document Reviewed: 04/15/2016 Elsevier Interactive Patient Education  2017 [employer]

## 2017-08-10 NOTE — Progress Notes (Signed)
GYN ENCOUNTER NOTE  Subjective:       Melissa Mcmahon is a 27 y.o. [id number] female here for gynecologic evaluation of the following issues:  1. Pelvic pain 2. Menorrhagia with irregular cycle 3. Family history of endometriosis 4. Fatigue 5. Dizziness 6. Hot flashes  Reports generalized shoulder and body pain that increases with menses as well as intermittent fatigue, dizziness with syncopal episode, and hot flashes. No relief with trial of OCPs, Nexplanon, or Depo. Declines IUD placement. Questions possibility of hysterectomy.   Previously seen by Dr. Valentino Saxonherry on 07/17/2014, see chart in old system, for ER follow up due to abdominal pain, bloating, and right sided pelvic pain. Mostly recently, she was a patient of Dr. Bonney AidStaebler at [facility]. Reports negative diagnostic lap in 02/2015.  Denies difficulty breathing or respiratory distress, chest pain, dysuria, and leg pain or swelling.   Family history significant for endometriosis.    Gynecologic History  Patient's last menstrual period was 07/28/2017 (exact date). Period Cycle (Days): 14 Period Duration (Days): 14 Period Pattern: (!) Irregular Menstrual Flow: Moderate, Heavy Menstrual Control Change Freq (Hours): One (1) to two (2) Dysmenorrhea: (!) Severe Dysmenorrhea Symptoms: Cramping, Throbbing, Other (Comment)(Dizziness, Fatigue, Hot flashes, Back pain, Pelvic pain, Shoulder pain)  Contraception: tubal ligation  Last Pap: 01/04/2014. Results were: normal  Obstetric History  OB History  Gravida Para Term Preterm AB Living  3 2 2     2   SAB TAB Ectopic Multiple Live Births          2    # Outcome Date GA Lbr Len/2nd Weight Sex Delivery Anes PTL Lv  3 Term 2014    F Vag-Spont   LIV  2 Term 2012    F Vag-Spont   LIV  1 [place]Gravida              Birth Comments: System Generated. Please review and update pregnancy details.    Past Medical History:  Diagnosis Date  . Acid reflux   . Asthma    H/O AS A CHILD-NO  INHALERS   . Endometriosis   . Headache    MIGRAINES-RARE    Past Surgical History:  Procedure Laterality Date  . FEET SURGERY    . KNEE ARTHROSCOPY    . LAPAROSCOPY N/A 02/08/2015   Procedure: LAPAROSCOPY DIAGNOSTIC;  Surgeon: Vena AustriaAndreas Staebler, MD;  Location: [facility] ORS;  Service: Gynecology;  Laterality: N/A;  . TUBAL LIGATION      Current Outpatient Medications on File Prior to Visit  Medication Sig Dispense Refill  . omeprazole (PRILOSEC) 20 MG capsule Take by mouth.     No current facility-administered medications on file prior to visit.     Allergies  Allergen Reactions  . Penicillins Hives    Social History   Socioeconomic History  . Marital status: Married    Spouse name: Not on file  . Number of children: Not on file  . Years of education: Not on file  . Highest education level: Not on file  Occupational History  . Not on file  Social Needs  . Financial resource strain: Not on file  . Food insecurity:    Worry: Not on file    Inability: Not on file  . Transportation needs:    Medical: Not on file    Non-medical: Not on file  Tobacco Use  . Smoking status: Current Every Day Smoker    Packs/day: 1.00    Years: 7.00    Pack years: 7.00  Types: E-cigarettes  . Smokeless tobacco: Never Used  Substance and Sexual Activity  . Alcohol use: No  . Drug use: No  . Sexual activity: Yes    Birth control/protection: Surgical  Lifestyle  . Physical activity:    Days per week: Not on file    Minutes per session: Not on file  . Stress: Not on file  Relationships  . Social connections:    Talks on phone: Not on file    Gets together: Not on file    Attends religious service: Not on file    Active member of club or organization: Not on file    Attends meetings of clubs or organizations: Not on file    Relationship status: Not on file  . Intimate partner violence:    Fear of current or ex partner: Not on file    Emotionally abused: Not on file     Physically abused: Not on file    Forced sexual activity: Not on file  Other Topics Concern  . Not on file  Social History Narrative  . Not on file    Family History  Problem Relation Age of Onset  . Diabetes Father   . Diabetes Maternal Aunt   . Breast cancer Neg Hx   . Ovarian cancer Neg Hx   . Colon cancer Neg Hx     The following portions of the patient's history were reviewed and updated as appropriate: allergies, current medications, past family history, past medical history, past social history, past surgical history and problem list.  Review of Systems  Review of Systems - Negative except as noted above.  History obtained from the patient  Objective:   BP 128/88   Pulse 67   Ht 5\' 3"  (1.6 m)   Wt 148 lb 6.4 oz (67.3 kg)   LMP 07/28/2017 (Exact Date) Comment: tubes tied  BMI 26.29 kg/m    CONSTITUTIONAL: Well-developed, well-nourished female in no acute distress.   HENT:  Normocephalic, atraumatic.   SKIN: Skin is warm and dry. No rash noted. Not diaphoretic. No erythema. No pallor.  NEUROLGIC: Alert and oriented to person, place, and time.   PSYCHIATRIC: Normal mood and affect. Normal behavior. Normal judgment and thought content.  ABDOMEN: Soft, non distended; Non tender.  No Organomegaly.  MUSCULOSKELETAL: Normal range of motion. No tenderness.  No cyanosis, clubbing, or edema.  Assessment:   1. Pelvic pain   2. Menorrhagia with irregular cycle - CBC - FSH/LH - Estradiol - Ferritin - Thyroid Panel With TSH  3. Family history of endometriosis  4. Fatigue, unspecified type - CBC - Ferritin - Thyroid Panel With TSH  5. Dizziness - CBC - Ferritin - Thyroid Panel With TSH  6. Hot flashes - CBC - Ferritin - Thyroid Panel With TSH   Plan:   Declines STI testing.   Discussed treatment options including hormonal IUD, NSAIDs, Lysteda, and endometrial ablation. Handouts given.   Patient declines IUD through [facility] program. Desires  ablation, if possible.   Reviewed red flag symptoms and when to call.   RTC x 1-2 weeks for follow up visit with Dr. Valentino Saxon.    Gunnar Bulla, CNM [facility], [facility]

## 2017-08-20 ENCOUNTER — Other Ambulatory Visit: Payer: Self-pay

## 2017-08-25 ENCOUNTER — Encounter: Payer: Self-pay | Admitting: Obstetrics and Gynecology

## 2017-09-02 ENCOUNTER — Encounter: Payer: Self-pay | Admitting: Emergency Medicine

## 2017-09-02 ENCOUNTER — Encounter: Payer: Self-pay | Admitting: Obstetrics and Gynecology

## 2017-09-02 ENCOUNTER — Emergency Department
Admission: EM | Admit: 2017-09-02 | Discharge: 2017-09-02 | Disposition: A | Discharge status: Home / Self Care | Payer: Self-pay | Attending: Emergency Medicine | Admitting: Emergency Medicine

## 2017-09-02 DIAGNOSIS — L02412 Cutaneous abscess of left axilla: Secondary | ICD-10-CM | POA: Insufficient documentation

## 2017-09-02 DIAGNOSIS — J45909 Unspecified asthma, uncomplicated: Secondary | ICD-10-CM | POA: Insufficient documentation

## 2017-09-02 DIAGNOSIS — F17290 Nicotine dependence, other tobacco product, uncomplicated: Secondary | ICD-10-CM | POA: Insufficient documentation

## 2017-09-02 MED ORDER — NAPROXEN 500 MG PO TABS: 500.0000 mg | ORAL_TABLET | Freq: Two times a day (BID) | ORAL | 0 refills | Status: DC

## 2017-09-02 MED ORDER — SULFAMETHOXAZOLE-TRIMETHOPRIM 800-160 MG PO TABS
1.0000 | ORAL_TABLET | Freq: Once | ORAL | Status: AC
  Administered 2017-09-02: 1 via ORAL
  Filled 2017-09-02: qty 1

## 2017-09-02 MED ORDER — OXYCODONE-ACETAMINOPHEN 5-325 MG PO TABS
1.0000 | ORAL_TABLET | Freq: Once | ORAL | Status: AC
  Administered 2017-09-02: 1 via ORAL
  Filled 2017-09-02: qty 1

## 2017-09-02 MED ORDER — OXYCODONE-ACETAMINOPHEN 7.5-325 MG PO TABS: 1.0000 | ORAL_TABLET | Freq: Four times a day (QID) | ORAL | 0 refills | Status: DC | PRN

## 2017-09-02 MED ORDER — NAPROXEN 500 MG PO TABS
500.0000 mg | ORAL_TABLET | Freq: Once | ORAL | Status: AC
  Administered 2017-09-02: 500 mg via ORAL
  Filled 2017-09-02: qty 1

## 2017-09-02 MED ORDER — SULFAMETHOXAZOLE-TRIMETHOPRIM 800-160 MG PO TABS: 1.0000 | ORAL_TABLET | Freq: Two times a day (BID) | ORAL | 0 refills | Status: DC

## 2017-09-02 NOTE — ED Notes (Signed)
Pt has a firm mass in left axilla around 1" in diameter, with swelling extending out from the firm area. Pt state she notice it a few days ago putting on Deoderant. When walking back from the lobby pt kept left shoulder extended out to keep pressure off the area.

## 2017-09-02 NOTE — ED Provider Notes (Signed)
[facility] Emergency Department Provider Note   ____________________________________________   First MD Initiated Contact with Patient 09/02/17 1747     (approximate)  I have reviewed the triage vital signs and the nursing notes.   HISTORY  Chief Complaint Abscess    HPI Melissa Mcmahon is a 27 y.o. female patient presents with pain swelling to the left axillary area for couple days.  Patient states 3 days ago she shaved the area and notes increased pain and swelling since that incident.  Patient denies any fever with this complaint.  Patient denies any drainage.  Patient rates the pain discomfort as a 6/10.  Patient described the pain is "aching".  No palliative measures for complaint.   Past Medical History:  Diagnosis Date  . Acid reflux   . Asthma    H/O AS A CHILD-NO INHALERS   . Endometriosis   . Headache    MIGRAINES-RARE    There are no active problems to display for this patient.   Past Surgical History:  Procedure Laterality Date  . FEET SURGERY    . KNEE ARTHROSCOPY    . LAPAROSCOPY N/A 02/08/2015   Procedure: LAPAROSCOPY DIAGNOSTIC;  Surgeon: Vena Austria, MD;  Location: [facility] ORS;  Service: Gynecology;  Laterality: N/A;  . TUBAL LIGATION      Prior to Admission medications   Medication Sig Start Date End Date Taking? Authorizing Provider  naproxen (NAPROSYN) 500 MG tablet Take 1 tablet (500 mg total) by mouth 2 (two) times daily with a meal. 09/02/17   Joni Reining, PA-C  omeprazole (PRILOSEC) 20 MG capsule Take by mouth.    [provider]  oxyCODONE-acetaminophen (PERCOCET) 7.5-325 MG tablet Take 1 tablet by mouth every 6 (six) hours as needed for severe pain. 09/02/17   Joni Reining, PA-C  sulfamethoxazole-trimethoprim (BACTRIM DS,SEPTRA DS) 800-160 MG tablet Take 1 tablet by mouth 2 (two) times daily. 09/02/17   Joni Reining, PA-C    Allergies Penicillins  Family History  Problem Relation Age of Onset    . Diabetes Father   . Diabetes Maternal Aunt   . Breast cancer Neg Hx   . Ovarian cancer Neg Hx   . Colon cancer Neg Hx     Social History Social History   Tobacco Use  . Smoking status: Current Every Day Smoker    Packs/day: 1.00    Years: 7.00    Pack years: 7.00    Types: E-cigarettes  . Smokeless tobacco: Never Used  Substance Use Topics  . Alcohol use: No  . Drug use: No    Review of Systems Constitutional: No fever/chills Eyes: No visual changes. ENT: No sore throat. Cardiovascular: Denies chest pain. Respiratory: Denies shortness of breath. Gastrointestinal: No abdominal pain.  No nausea, no vomiting.  No diarrhea.  No constipation. Genitourinary: Negative for dysuria. Musculoskeletal: Negative for back pain. Skin: Negative for rash.  Swollen left axillary area. Neurological: Negative for headaches, focal weakness or numbness. Allergic/Immunilogical: Penicillin ____________________________________________   PHYSICAL EXAM:  VITAL SIGNS: ED Triage Vitals  Enc Vitals Group     BP 09/02/17 1707 138/83     Pulse Rate 09/02/17 1707 72     Resp --      Temp 09/02/17 1707 99 F (37.2 C)     Temp Source 09/02/17 1707 Oral     SpO2 09/02/17 1707 100 %     Weight 09/02/17 1650 147 lb (66.7 kg)     Height 09/02/17  1650  (1.6 m)     Head Circumference --      Peak Flow --      Pain Score 09/02/17 1649 6     Pain Loc --      Pain Edu? --      Excl. in GC? --    Constitutional: Alert and oriented. Well appearing and in no acute distress. Cardiovascular: Normal rate, regular rhythm. Grossly normal heart sounds.  Good peripheral circulation. Respiratory: Normal respiratory effort.  No retractions. Lungs CTAB. Gastrointestinal: Soft and nontender. No distention. No abdominal bruits. No CVA tenderness. Skin:  Skin is warm, dry and intact. No rash noted.  Nonfluctuant nodule lesion left axillary area. Psychiatric: Mood and affect are normal. Speech and  behavior are normal.  ____________________________________________   LABS (all labs ordered are listed, but only abnormal results are displayed)  Labs Reviewed - No data to display ____________________________________________  EKG   ____________________________________________  RADIOLOGY    Official radiology report(s): No results found.  ____________________________________________   PROCEDURES  Procedure(s) performed: None  Procedures  Critical Care performed: No  ____________________________________________   INITIAL IMPRESSION / ASSESSMENT AND PLAN / ED COURSE  As part of my medical decision making, I reviewed the following data within the electronic MEDICAL RECORD NUMBER    Left Axilla abscess.  Discussed with patient rationale for not incised and drained at this time.  Patient given discharge care instructions.  Patient advised return right ED condition worsens.  Take medication as directed.      ____________________________________________   FINAL CLINICAL IMPRESSION(S) / ED DIAGNOSES  Final diagnoses:  Abscess of left axilla     ED Discharge Orders        Ordered    sulfamethoxazole-trimethoprim (BACTRIM DS,SEPTRA DS) 800-160 MG tablet  2 times daily     09/02/17 1803    oxyCODONE-acetaminophen (PERCOCET) 7.5-325 MG tablet  Every 6 hours PRN     09/02/17 1803    naproxen (NAPROSYN) 500 MG tablet  2 times daily with meals     09/02/17 1803       Note:  This document was prepared using Dragon voice recognition software and may include unintentional dictation errors.    Joni Reining, PA-C 09/02/17 1814    Phineas Semen, MD 09/02/17 Rickey Primus

## 2017-09-02 NOTE — ED Triage Notes (Signed)
Patient presents to the ED with left axillary abscess.  Patient states she noticed it a couple of days ago.  Patient is in no obvious distress at this time.

## 2017-09-28 DIAGNOSIS — R1011 Right upper quadrant pain: Secondary | ICD-10-CM | POA: Insufficient documentation

## 2017-09-28 DIAGNOSIS — F17210 Nicotine dependence, cigarettes, uncomplicated: Secondary | ICD-10-CM | POA: Insufficient documentation

## 2017-09-28 DIAGNOSIS — K8020 Calculus of gallbladder without cholecystitis without obstruction: Secondary | ICD-10-CM | POA: Insufficient documentation

## 2017-09-28 NOTE — ED Triage Notes (Addendum)
Pt ambulatory to triage, tearful, appears uncomfortable; Pt reports right upper abd pain radiating into right shoulder, onset PTA accomp by nausea; denies hx of same

## 2017-09-29 ENCOUNTER — Emergency Department: Payer: Self-pay

## 2017-09-29 ENCOUNTER — Encounter: Payer: Self-pay | Admitting: Emergency Medicine

## 2017-09-29 ENCOUNTER — Emergency Department
Admission: EM | Admit: 2017-09-29 | Discharge: 2017-09-29 | Disposition: A | Discharge status: Home / Self Care | Payer: Self-pay | Attending: Emergency Medicine | Admitting: Emergency Medicine

## 2017-09-29 DIAGNOSIS — R1011 Right upper quadrant pain: Secondary | ICD-10-CM

## 2017-09-29 DIAGNOSIS — K8050 Calculus of bile duct without cholangitis or cholecystitis without obstruction: Secondary | ICD-10-CM

## 2017-09-29 LAB — CBC WITH DIFFERENTIAL/PLATELET
BASOS ABS: 0.1 10*3/uL (ref 0–0.1)
BASOS PCT: 1 %
Eosinophils Absolute: 0.2 10*3/uL (ref 0–0.7)
Eosinophils Relative: 2 %
HEMATOCRIT: 38.8 % (ref 35.0–47.0)
HEMOGLOBIN: 13.1 g/dL (ref 12.0–16.0)
LYMPHS PCT: 25 %
Lymphs Abs: 2.1 10*3/uL (ref 1.0–3.6)
MCH: 30.2 pg (ref 26.0–34.0)
MCHC: 33.9 g/dL (ref 32.0–36.0)
MCV: 89.2 fL (ref 80.0–100.0)
MONO ABS: 0.9 10*3/uL (ref 0.2–0.9)
Monocytes Relative: 10 %
NEUTROS ABS: 5.2 10*3/uL (ref 1.4–6.5)
NEUTROS PCT: 62 %
Platelets: 302 10*3/uL (ref 150–440)
RBC: 4.35 MIL/uL (ref 3.80–5.20)
RDW: 13.8 % (ref 11.5–14.5)
WBC: 8.4 10*3/uL (ref 3.6–11.0)

## 2017-09-29 LAB — COMPREHENSIVE METABOLIC PANEL
ALT: 18 U/L (ref 14–54)
ANION GAP: 7 (ref 5–15)
AST: 22 U/L (ref 15–41)
Albumin: 4.5 g/dL (ref 3.5–5.0)
Alkaline Phosphatase: 54 U/L (ref 38–126)
BILIRUBIN TOTAL: 0.8 mg/dL (ref 0.3–1.2)
BUN: 11 mg/dL (ref 6–20)
CO2: 26 mmol/L (ref 22–32)
Calcium: 9.5 mg/dL (ref 8.9–10.3)
Chloride: 108 mmol/L (ref 101–111)
Creatinine, Ser: 0.65 mg/dL (ref 0.44–1.00)
Glucose, Bld: 91 mg/dL (ref 65–99)
POTASSIUM: 3.4 mmol/L — AB (ref 3.5–5.1)
Sodium: 141 mmol/L (ref 135–145)
TOTAL PROTEIN: 7.4 g/dL (ref 6.5–8.1)

## 2017-09-29 LAB — LIPASE, BLOOD: LIPASE: 38 U/L (ref 11–51)

## 2017-09-29 MED ORDER — FENTANYL CITRATE (PF) 100 MCG/2ML IJ SOLN
50.0000 ug | Freq: Once | INTRAMUSCULAR | Status: AC
  Administered 2017-09-29: 50 ug via INTRAVENOUS
  Filled 2017-09-29: qty 2

## 2017-09-29 MED ORDER — OXYCODONE-ACETAMINOPHEN 5-325 MG PO TABS: 1.0000 | ORAL_TABLET | ORAL | 0 refills | Status: DC | PRN

## 2017-09-29 MED ORDER — ONDANSETRON HCL 4 MG/2ML IJ SOLN
4.0000 mg | Freq: Once | INTRAMUSCULAR | Status: AC
  Administered 2017-09-29: 4 mg via INTRAVENOUS
  Filled 2017-09-29: qty 2

## 2017-09-29 MED ORDER — OXYCODONE-ACETAMINOPHEN 5-325 MG PO TABS
1.0000 | ORAL_TABLET | Freq: Once | ORAL | Status: AC
  Administered 2017-09-29: 1 via ORAL
  Filled 2017-09-29: qty 1

## 2017-09-29 MED ORDER — FAMOTIDINE IN NACL 20-0.9 MG/50ML-% IV SOLN
20.0000 mg | Freq: Once | INTRAVENOUS | Status: AC
  Administered 2017-09-29: 20 mg via INTRAVENOUS
  Filled 2017-09-29: qty 50

## 2017-09-29 MED ORDER — ONDANSETRON 4 MG PO TBDP: 4.0000 mg | ORAL_TABLET | Freq: Three times a day (TID) | ORAL | 0 refills | Status: DC | PRN

## 2017-09-29 MED ORDER — ONDANSETRON HCL 4 MG PO TABS
4.0000 mg | ORAL_TABLET | Freq: Once | ORAL | Status: AC
  Administered 2017-09-29: 4 mg via ORAL
  Filled 2017-09-29: qty 1

## 2017-09-29 NOTE — Discharge Instructions (Addendum)
1. Take medicines as needed for pain & nausea (Percocet/Zofran #30). 2. Clear liquids x 12 hours, then bland diet x 1 week, then slowly advance diet as tolerated. Avoid fatty, greasy, spicy foods and drinks. 3. Return to the ER for worsening symptoms, persistent vomiting, fever, difficulty breathing or other concerns.  

## 2017-09-29 NOTE — ED Provider Notes (Signed)
[facility] Emergency Department Provider Note   ____________________________________________   First MD Initiated Contact with Patient 09/29/17 [phone]     (approximate)  I have reviewed the triage vital signs and the nursing notes.   HISTORY  Chief Complaint Abdominal Pain    HPI Melissa Mcmahon is a 27 y.o. female who presents to the ED from home with a chief complaint of upper abdominal pain.  Patient reports onset of right upper quadrant abdominal pain approximately 30 minutes after eating pizza.  Symptoms associated with nausea, no vomiting.  Denies associated fever, chills, chest pain, shortness of breath, diarrhea.  Denies recent travel or trauma.  Similar symptoms previously.  Patient describes what sounds like a HIDA scan but never surgery.   Past Medical History:  Diagnosis Date  . Acid reflux   . Asthma    H/O AS A CHILD-NO INHALERS   . Endometriosis   . Headache    MIGRAINES-RARE    There are no active problems to display for this patient.   Past Surgical History:  Procedure Laterality Date  . FEET SURGERY    . KNEE ARTHROSCOPY    . LAPAROSCOPY N/A 02/08/2015   Procedure: LAPAROSCOPY DIAGNOSTIC;  Surgeon: Vena Austria, MD;  Location: [facility] ORS;  Service: Gynecology;  Laterality: N/A;  . TUBAL LIGATION      Prior to Admission medications   Medication Sig Start Date End Date Taking? Authorizing Provider  naproxen (NAPROSYN) 500 MG tablet Take 1 tablet (500 mg total) by mouth 2 (two) times daily with a meal. 09/02/17   Joni Reining, PA-C  omeprazole (PRILOSEC) 20 MG capsule Take by mouth.    [provider]  ondansetron (ZOFRAN ODT) 4 MG disintegrating tablet Take 1 tablet (4 mg total) by mouth every 8 (eight) hours as needed for nausea or vomiting. 09/29/17   Irean Hong, MD  oxyCODONE-acetaminophen (PERCOCET/ROXICET) 5-325 MG tablet Take 1 tablet by mouth every 4 (four) hours as needed for severe pain. 09/29/17   Irean Hong, MD  sulfamethoxazole-trimethoprim (BACTRIM DS,SEPTRA DS) 800-160 MG tablet Take 1 tablet by mouth 2 (two) times daily. 09/02/17   Joni Reining, PA-C    Allergies Penicillins  Family History  Problem Relation Age of Onset  . Diabetes Father   . Diabetes Maternal Aunt   . Breast cancer Neg Hx   . Ovarian cancer Neg Hx   . Colon cancer Neg Hx     Social History Social History   Tobacco Use  . Smoking status: Current Every Day Smoker    Packs/day: 1.00    Years: 7.00    Pack years: 7.00    Types: E-cigarettes  . Smokeless tobacco: Never Used  Substance Use Topics  . Alcohol use: No  . Drug use: No    Review of Systems  Constitutional: No fever/chills Eyes: No visual changes. ENT: No sore throat. Cardiovascular: Denies chest pain. Respiratory: Denies shortness of breath. Gastrointestinal: Positive for abdominal pain.  Positive for nausea, no vomiting.  No diarrhea.  No constipation. Genitourinary: Negative for dysuria. Musculoskeletal: Negative for back pain. Skin: Negative for rash. Neurological: Negative for headaches, focal weakness or numbness.   ____________________________________________   PHYSICAL EXAM:  VITAL SIGNS: ED Triage Vitals  Enc Vitals Group     BP 09/29/17 0004 133/83     Pulse Rate 09/29/17 0004 93     Resp 09/29/17 0004 (!) 22     Temp --  Temp src --      SpO2 09/29/17 0004 96 %     Weight 09/29/17 0000 145 lb (65.8 kg)     Height 09/29/17 0000  (1.6 m)     Head Circumference --      Peak Flow --      Pain Score 09/29/17 0000 7     Pain Loc --      Pain Edu? --      Excl. in GC? --     Constitutional: Alert and oriented. Well appearing and in mild acute distress.  Tearful. Eyes: Conjunctivae are normal. PERRL. EOMI. Head: Atraumatic. Nose: No congestion/rhinnorhea. Mouth/Throat: Mucous membranes are moist.  Oropharynx non-erythematous. Neck: No stridor.   Cardiovascular: Normal rate, regular rhythm. Grossly  normal heart sounds.  Good peripheral circulation. Respiratory: Normal respiratory effort.  No retractions. Lungs CTAB. Gastrointestinal: Soft and mildly tender to palpation right upper quadrant without rebound or guarding.  No distention. No abdominal bruits. No CVA tenderness. Musculoskeletal: No lower extremity tenderness nor edema.  No joint effusions. Neurologic:  Normal speech and language. No gross focal neurologic deficits are appreciated. No gait instability. Skin:  Skin is warm, dry and intact. No rash noted. Psychiatric: Mood and affect are normal. Speech and behavior are normal.  ____________________________________________   LABS (all labs ordered are listed, but only abnormal results are displayed)  Labs Reviewed  COMPREHENSIVE METABOLIC PANEL - Abnormal; Notable for the following components:      Result Value   Potassium 3.4 (*)    All other components within normal limits  CBC WITH DIFFERENTIAL/PLATELET  LIPASE, BLOOD  URINALYSIS, COMPLETE (UACMP) WITH MICROSCOPIC  POC URINE PREG, ED   ____________________________________________  EKG  None ____________________________________________  RADIOLOGY  ED MD interpretation: Cholelithiasis without cholecystitis  Official radiology report(s): US Abdomen Limited Ruq  Result Date: 09/29/2017 CLINICAL DATA:  Right upper quadrant pain after eating EXAM: ULTRASOUND ABDOMEN LIMITED RIGHT UPPER QUADRANT COMPARISON:  None. FINDINGS: Gallbladder: The gallbladder is partially contracted with a wall thickness that measures 4 mm. Negative sonographic Eulah Pont sign was reported by the sonographer. There is no pericholecystic fluid. There are multiple mobile stones measuring up to 3 mm. Area of ring down artifact at the gallbladder wall may indicate focal adenomyomatosis. Common bile duct: Diameter: 2.5 mm Liver: No focal lesion identified. Within normal limits in parenchymal echogenicity. Portal vein is patent on color Doppler imaging  with normal direction of blood flow towards the liver. IMPRESSION: Cholelithiasis without other evidence of acute cholecystitis. The gallbladder is contracted secondary to recent fatty meal. Electronically Signed   By: Deatra Robinson M.D.   On: 09/29/2017 03:49    ____________________________________________   PROCEDURES  Procedure(s) performed: None  Procedures  Critical Care performed: No  ____________________________________________   INITIAL IMPRESSION / ASSESSMENT AND PLAN / ED COURSE  As part of my medical decision making, I reviewed the following data within the electronic MEDICAL RECORD NUMBER History obtained from family, Nursing notes reviewed and incorporated, Labs reviewed and Notes from prior ED visits   27 year old female who presents with right upper quadrant abdominal pain after eating. Differential diagnosis includes, but is not limited to, biliary disease (biliary colic, acute cholecystitis, cholangitis, choledocholithiasis, etc), intrathoracic causes for epigastric abdominal pain including ACS, gastritis, duodenitis, pancreatitis, small bowel or large bowel obstruction, abdominal aortic aneurysm, hernia, and ulcer(s).   Clinical Course as of Sep 30 406  Tue Sep 29, 2017  0404 Updated patient and family member of ultrasound results.  Will discharge home on Percocet for pain, Zofran for nausea, bland diet and outpatient surgery follow-up.  Strict return precautions given.  Both verbalize understanding and agree with plan of care.   [JS]    Clinical Course User Index [JS] Irean Hong, MD     ____________________________________________   FINAL CLINICAL IMPRESSION(S) / ED DIAGNOSES  Final diagnoses:  Right upper quadrant abdominal pain  Biliary colic     ED Discharge Orders        Ordered    oxyCODONE-acetaminophen (PERCOCET/ROXICET) 5-325 MG tablet  Every 4 hours PRN     09/29/17 0407    ondansetron (ZOFRAN ODT) 4 MG disintegrating tablet  Every 8 hours  PRN     09/29/17 0407       Note:  This document was prepared using Dragon voice recognition software and may include unintentional dictation errors.    Irean Hong, MD 09/29/17 [phone]

## 2017-09-29 NOTE — ED Notes (Signed)
Pt went to ultrasound.

## 2017-10-01 ENCOUNTER — Other Ambulatory Visit: Payer: Self-pay

## 2017-10-01 ENCOUNTER — Encounter: Payer: Self-pay | Admitting: Emergency Medicine

## 2017-10-01 ENCOUNTER — Emergency Department: Payer: Self-pay

## 2017-10-01 ENCOUNTER — Inpatient Hospital Stay
Admission: EM | Admit: 2017-10-01 | Discharge: 2017-10-02 | DRG: 446 | Disposition: A | Discharge status: Home / Self Care | Payer: Self-pay | Attending: Internal Medicine | Admitting: Internal Medicine

## 2017-10-01 ENCOUNTER — Telehealth: Payer: Self-pay | Admitting: Surgery

## 2017-10-01 DIAGNOSIS — K8000 Calculus of gallbladder with acute cholecystitis without obstruction: Principal | ICD-10-CM | POA: Diagnosis present

## 2017-10-01 DIAGNOSIS — F17210 Nicotine dependence, cigarettes, uncomplicated: Secondary | ICD-10-CM | POA: Diagnosis present

## 2017-10-01 DIAGNOSIS — Z791 Long term (current) use of non-steroidal anti-inflammatories (NSAID): Secondary | ICD-10-CM

## 2017-10-01 DIAGNOSIS — N809 Endometriosis, unspecified: Secondary | ICD-10-CM | POA: Diagnosis present

## 2017-10-01 DIAGNOSIS — K8020 Calculus of gallbladder without cholecystitis without obstruction: Secondary | ICD-10-CM

## 2017-10-01 DIAGNOSIS — Z880 Allergy status to penicillin: Secondary | ICD-10-CM

## 2017-10-01 DIAGNOSIS — J45909 Unspecified asthma, uncomplicated: Secondary | ICD-10-CM | POA: Diagnosis present

## 2017-10-01 DIAGNOSIS — Z79899 Other long term (current) drug therapy: Secondary | ICD-10-CM

## 2017-10-01 DIAGNOSIS — K219 Gastro-esophageal reflux disease without esophagitis: Secondary | ICD-10-CM | POA: Diagnosis present

## 2017-10-01 DIAGNOSIS — R1011 Right upper quadrant pain: Secondary | ICD-10-CM

## 2017-10-01 LAB — COMPREHENSIVE METABOLIC PANEL
ALT: 16 U/L (ref 14–54)
AST: 17 U/L (ref 15–41)
Albumin: 4.4 g/dL (ref 3.5–5.0)
Alkaline Phosphatase: 51 U/L (ref 38–126)
Anion gap: 8 (ref 5–15)
BUN: 11 mg/dL (ref 6–20)
CO2: 25 mmol/L (ref 22–32)
CREATININE: 0.78 mg/dL (ref 0.44–1.00)
Calcium: 9.3 mg/dL (ref 8.9–10.3)
Chloride: 107 mmol/L (ref 101–111)
GFR calc Af Amer: >60 mL/min (ref >60)
GFR calc non Af Amer: >60 mL/min (ref >60)
Glucose, Bld: 84 mg/dL (ref 65–99)
POTASSIUM: 4 mmol/L (ref 3.5–5.1)
Sodium: 140 mmol/L (ref 135–145)
TOTAL PROTEIN: 7.4 g/dL (ref 6.5–8.1)
Total Bilirubin: 0.8 mg/dL (ref 0.3–1.2)

## 2017-10-01 LAB — CBC
HEMATOCRIT: 40.8 % (ref 35.0–47.0)
Hemoglobin: 14 g/dL (ref 12.0–16.0)
MCH: 30.7 pg (ref 26.0–34.0)
MCHC: 34.4 g/dL (ref 32.0–36.0)
MCV: 89.3 fL (ref 80.0–100.0)
PLATELETS: 287 10*3/uL (ref 150–440)
RBC: 4.56 MIL/uL (ref 3.80–5.20)
RDW: 13.5 % (ref 11.5–14.5)
WBC: 7.1 10*3/uL (ref 3.6–11.0)

## 2017-10-01 LAB — URINALYSIS, COMPLETE (UACMP) WITH MICROSCOPIC
Bilirubin Urine: NEGATIVE
GLUCOSE, UA: NEGATIVE mg/dL
HGB URINE DIPSTICK: NEGATIVE
Ketones, ur: NEGATIVE mg/dL
LEUKOCYTES UA: NEGATIVE
NITRITE: NEGATIVE
PH: 6 (ref 5.0–8.0)
Protein, ur: NEGATIVE mg/dL
SPECIFIC GRAVITY, URINE: 1.026 (ref 1.005–1.030)

## 2017-10-01 LAB — LIPASE, BLOOD: Lipase: 76 U/L — ABNORMAL HIGH (ref 11–51)

## 2017-10-01 LAB — POCT PREGNANCY, URINE: PREG TEST UR: NEGATIVE

## 2017-10-01 MED ORDER — HYDROMORPHONE HCL 1 MG/ML IJ SOLN
1.0000 mg | Freq: Once | INTRAMUSCULAR | Status: AC
  Administered 2017-10-01: 1 mg via INTRAVENOUS
  Filled 2017-10-01: qty 1

## 2017-10-01 MED ORDER — ONDANSETRON 4 MG PO TBDP
ORAL_TABLET | ORAL | Status: AC
  Filled 2017-10-01: qty 1

## 2017-10-01 MED ORDER — FENTANYL CITRATE (PF) 100 MCG/2ML IJ SOLN
100.0000 ug | Freq: Once | INTRAMUSCULAR | Status: AC
  Administered 2017-10-01: 100 ug via INTRAVENOUS
  Filled 2017-10-01: qty 2

## 2017-10-01 MED ORDER — OXYCODONE-ACETAMINOPHEN 5-325 MG PO TABS
ORAL_TABLET | ORAL | Status: AC
  Filled 2017-10-01: qty 1

## 2017-10-01 MED ORDER — ENOXAPARIN SODIUM 40 MG/0.4ML ~~LOC~~ SOLN
40.0000 mg | SUBCUTANEOUS | Status: DC
  Administered 2017-10-01: 40 mg via SUBCUTANEOUS
  Filled 2017-10-01: qty 0.4

## 2017-10-01 MED ORDER — ONDANSETRON HCL 4 MG PO TABS: 4.0000 mg | ORAL_TABLET | Freq: Four times a day (QID) | ORAL | Status: DC | PRN

## 2017-10-01 MED ORDER — PANTOPRAZOLE SODIUM 40 MG PO TBEC
40.0000 mg | DELAYED_RELEASE_TABLET | Freq: Every day | ORAL | Status: DC
  Administered 2017-10-01 – 2017-10-02 (×2): 40 mg via ORAL
  Filled 2017-10-01 (×2): qty 1

## 2017-10-01 MED ORDER — PROMETHAZINE HCL 25 MG/ML IJ SOLN
25.0000 mg | Freq: Once | INTRAMUSCULAR | Status: AC
  Administered 2017-10-01: 25 mg via INTRAVENOUS
  Filled 2017-10-01: qty 1

## 2017-10-01 MED ORDER — OXYCODONE-ACETAMINOPHEN 5-325 MG PO TABS
1.0000 | ORAL_TABLET | ORAL | Status: DC | PRN
  Administered 2017-10-01: 1 via ORAL

## 2017-10-01 MED ORDER — ONDANSETRON HCL 4 MG/2ML IJ SOLN
4.0000 mg | Freq: Four times a day (QID) | INTRAMUSCULAR | Status: DC | PRN
  Administered 2017-10-02: 4 mg via INTRAVENOUS
  Filled 2017-10-01: qty 2

## 2017-10-01 MED ORDER — KETOROLAC TROMETHAMINE 30 MG/ML IJ SOLN
30.0000 mg | Freq: Four times a day (QID) | INTRAMUSCULAR | Status: DC | PRN
  Administered 2017-10-02 (×2): 30 mg via INTRAVENOUS
  Filled 2017-10-01 (×2): qty 1

## 2017-10-01 MED ORDER — ACETAMINOPHEN 650 MG RE SUPP: 650.0000 mg | Freq: Four times a day (QID) | RECTAL | Status: DC | PRN

## 2017-10-01 MED ORDER — ACETAMINOPHEN 325 MG PO TABS: 650.0000 mg | ORAL_TABLET | Freq: Four times a day (QID) | ORAL | Status: DC | PRN

## 2017-10-01 MED ORDER — ONDANSETRON 4 MG PO TBDP
4.0000 mg | ORAL_TABLET | Freq: Once | ORAL | Status: AC | PRN
  Administered 2017-10-01: 4 mg via ORAL

## 2017-10-01 MED ORDER — HYDROCODONE-ACETAMINOPHEN 5-325 MG PO TABS
1.0000 | ORAL_TABLET | ORAL | Status: DC | PRN
  Administered 2017-10-01 – 2017-10-02 (×2): 1 via ORAL
  Filled 2017-10-01 (×2): qty 1

## 2017-10-01 MED ORDER — SODIUM CHLORIDE 0.9 % IV SOLN
INTRAVENOUS | Status: DC
  Administered 2017-10-01 – 2017-10-02 (×2): via INTRAVENOUS

## 2017-10-01 NOTE — ED Triage Notes (Addendum)
Pt here for increasing RUQ pain with vomiting.  Seen here Monday and dx with biliary colic. Supposed to see surgeon tomorrow r/t surgery but was told to come back to ED for re-evaluation.  No fevers.  Last episode vomiting 1.5 hr ago.  Pt appears in pain.

## 2017-10-01 NOTE — ED Notes (Signed)
Patient transported to Ultrasound 

## 2017-10-01 NOTE — ED Notes (Signed)
Melissa Mcmahon, [place] aware of bed assigned

## 2017-10-01 NOTE — H&P (Signed)
[facility] - [place] at [facility]   PATIENT NAME: Melissa Mcmahon    MR#:  [id number]  DATE OF BIRTH:  17-Jul-1990  DATE OF ADMISSION:  10/01/2017  PRIMARY CARE PHYSICIAN: System, Pcp Not In   REQUESTING/REFERRING PHYSICIAN: Dr. Roxan Hockey  CHIEF COMPLAINT:  increasing abdominal pain right upper quadrant along with vomiting  HISTORY OF PRESENT ILLNESS:  Melissa Mcmahon  is a 27 y.o. female with a known history of acid reflux, endometriosis emergency room with intractable nausea vomiting and right upper quadrant abdominal pain. Patient was seen two days ago in the emergency room with similar symptoms workup showed patient has gallstones and was discharged from the ER with outpatient follow-up with surgery which is scheduled for tomorrow.  It started having intractable nausea vomiting with increasing right upper quadrant abdominal pain. Came to the emergency room lipases now elevated at 76. LFTs are normal. Patient does have gallstones and is being admitted for further evaluation of management    PAST MEDICAL HISTORY:   Past Medical History:  Diagnosis Date  . Acid reflux   . Asthma    H/O AS A CHILD-NO INHALERS   . Endometriosis   . Headache    MIGRAINES-RARE    PAST SURGICAL HISTOIRY:   Past Surgical History:  Procedure Laterality Date  . FEET SURGERY    . KNEE ARTHROSCOPY    . LAPAROSCOPY N/A 02/08/2015   Procedure: LAPAROSCOPY DIAGNOSTIC;  Surgeon: Vena Austria, MD;  Location: [facility] ORS;  Service: Gynecology;  Laterality: N/A;  . TUBAL LIGATION      SOCIAL HISTORY:   Social History   Tobacco Use  . Smoking status: Current Every Day Smoker    Packs/day: 1.00    Years: 7.00    Pack years: 7.00    Types: E-cigarettes  . Smokeless tobacco: Never Used  Substance Use Topics  . Alcohol use: No    FAMILY HISTORY:   Family History  Problem Relation Age of Onset  . Diabetes Father   . Diabetes Maternal Aunt   . Breast cancer Neg Hx   .  Ovarian cancer Neg Hx   . Colon cancer Neg Hx     DRUG ALLERGIES:   Allergies  Allergen Reactions  . Penicillins Hives    REVIEW OF SYSTEMS:  Review of Systems  Constitutional: Negative for chills, fever and weight loss.  HENT: Negative for ear discharge, ear pain and nosebleeds.   Eyes: Negative for blurred vision, pain and discharge.  Respiratory: Negative for sputum production, shortness of breath, wheezing and stridor.   Cardiovascular: Negative for chest pain, palpitations, orthopnea and PND.  Gastrointestinal: Positive for abdominal pain, nausea and vomiting. Negative for diarrhea.  Genitourinary: Negative for frequency and urgency.  Musculoskeletal: Negative for back pain and joint pain.  Neurological: Negative for sensory change, speech change, focal weakness and weakness.  Psychiatric/Behavioral: Negative for depression and hallucinations. The patient is not nervous/anxious.      MEDICATIONS AT HOME:   Prior to Admission medications   Medication Sig Start Date End Date Taking? Authorizing Provider  naproxen (NAPROSYN) 500 MG tablet Take 1 tablet (500 mg total) by mouth 2 (two) times daily with a meal. 09/02/17   Joni Reining, PA-C  omeprazole (PRILOSEC) 20 MG capsule Take by mouth.    [provider]  ondansetron (ZOFRAN ODT) 4 MG disintegrating tablet Take 1 tablet (4 mg total) by mouth every 8 (eight) hours as needed for nausea or vomiting. 09/29/17   Dolores Frame,  Rockford Sink, MD  oxyCODONE-acetaminophen (PERCOCET/ROXICET) 5-325 MG tablet Take 1 tablet by mouth every 4 (four) hours as needed for severe pain. 09/29/17   Irean Hong, MD  sulfamethoxazole-trimethoprim (BACTRIM DS,SEPTRA DS) 800-160 MG tablet Take 1 tablet by mouth 2 (two) times daily. 09/02/17   Joni Reining, PA-C      VITAL SIGNS:  Blood pressure (!) 137/96, pulse 68, temperature 98.5 F (36.9 C), temperature source Oral, resp. rate 19, height  (1.6 m), weight 65.8 kg (145 lb), last menstrual period  09/15/2017, SpO2 100 %.  PHYSICAL EXAMINATION:  GENERAL:  27 y.o.-year-old patient lying in the bed with no acute distress.  EYES: Pupils equal, round, reactive to light and accommodation. No scleral icterus. Extraocular muscles intact.  HEENT: Head atraumatic, normocephalic. Oropharynx and nasopharynx clear.  NECK:  Supple, no jugular venous distention. No thyroid enlargement, no tenderness.  LUNGS: Normal breath sounds bilaterally, no wheezing, rales,rhonchi or crepitation. No use of accessory muscles of respiration.  CARDIOVASCULAR: S1, S2 normal. No murmurs, rubs, or gallops.  ABDOMEN: Soft, nontender, nondistended. Bowel sounds present. No organomegaly or mass.  EXTREMITIES: No pedal edema, cyanosis, or clubbing.  NEUROLOGIC: Cranial nerves II through XII are intact. Muscle strength 5/5 in all extremities. Sensation intact. Gait not checked.  PSYCHIATRIC: The patient is alert and oriented x 3.  SKIN: No obvious rash, lesion, or ulcer.   LABORATORY PANEL:   CBC Recent Labs  Lab 10/01/17 0524  WBC 7.1  HGB 14.0  HCT 40.8  PLT 287   ------------------------------------------------------------------------------------------------------------------  Chemistries  Recent Labs  Lab 10/01/17 0524  NA 140  K 4.0  CL 107  CO2 25  GLUCOSE 84  BUN 11  CREATININE 0.78  CALCIUM 9.3  AST 17  ALT 16  ALKPHOS 51  BILITOT 0.8   ------------------------------------------------------------------------------------------------------------------  Cardiac Enzymes No results for input(s): TROPONINI in the last 168 hours. ------------------------------------------------------------------------------------------------------------------  RADIOLOGY:  US Abdomen Limited Ruq  Result Date: 10/01/2017 CLINICAL DATA:  Right upper quadrant abdominal pain. History of cholelithiasis. EXAM: ULTRASOUND ABDOMEN LIMITED RIGHT UPPER QUADRANT COMPARISON:  09/29/2017 FINDINGS: Gallbladder: A somewhat  flat gallstone is present in the gallbladder, measuring 7 mm in long axis. No gallbladder wall thickening or pericholecystic fluid. Sonographic Murphy's sign absent. Common bile duct: Diameter: 3-4 mm.  No directly visualized choledocholithiasis. Liver: No focal lesion identified. Within normal limits in parenchymal echogenicity. Portal vein is patent on color Doppler imaging with normal direction of blood flow towards the liver. IMPRESSION: 1. Cholelithiasis. No gallbladder wall thickening or pericholecystic fluid. No directly visualized choledocholithiasis or biliary dilatation. Sonographic Murphy's sign absent. Electronically Signed   By: Gaylyn Rong M.D.   On: 10/01/2017 17:29    EKG:    IMPRESSION AND PLAN:   Melissa Mcmahon  is a 27 y.o. female with a known history of acid reflux, endometriosis emergency room with intractable nausea vomiting and right upper quadrant abdominal pain. Patient was seen two days ago in the emergency room with similar symptoms workup showed patient has gallstones  1. acute cholelithiasis w/o evidence of cholecystitis -admit to surgical floor -IV fluids, PRN IV pain meds -NPO after midnight intent anticipation for possible surgery tomorrow -surgical consultation  2. intractable nausea vomiting secondary to 1. -continue IV fluids -PRN Zofran  3. DVT prophylaxis Lovenox  All the records are reviewed and case discussed with ED provider. Management plans discussed with the patient, family and they are in agreement.  CODE STATUS: Full  TOTAL TIME TAKING  CARE OF THIS PATIENT: *45* minutes.    Enedina Finner M.D on 10/01/2017 at 7:24 PM  Between 7am to 6pm - Pager - [phone]  After 6pm go to [email or web address] - password [email or web address] [facility]  [facility]  Office  [phone]  CC: Primary care physician; System, Pcp Not In

## 2017-10-01 NOTE — ED Notes (Addendum)
Pt given pain medication in triage. Aware cannot drive. Informed let nurse out front know if gets worse.

## 2017-10-01 NOTE — ED Provider Notes (Signed)
[facility] Emergency Department Provider Note  ____________________________________________  Time seen: Approximately 6:09 PM  I have reviewed the triage vital signs and the nursing notes.   HISTORY  Chief Complaint Abdominal Pain    HPI Melissa Mcmahon is a 27 y.o. female who presents the emergency department complaining of severe right upper quadrant pain.  Patient was seen and evaluated 3 days ago in this department for similar symptoms.  Patient had extensive work-up and was diagnosed with biliary colic secondary to cholelithiasis.  At that time, no indication of cholecystitis.  Patient was referred to surgery as an outpatient, appointment was scheduled for tomorrow.  Patient has significant worsening of pain, nausea and vomiting.  She reports that she is unable to maintain good oral intake.  Patient denies any fevers or chills, chest pain, right lower quadrant or other abdominal pain.  No diarrhea or constipation.  Patient has been taking prescribed Percocet at home with no relief of symptoms.    Past Medical History:  Diagnosis Date  . Acid reflux   . Asthma    H/O AS A CHILD-NO INHALERS   . Endometriosis   . Headache    MIGRAINES-RARE    Patient Active Problem List   Diagnosis Date Noted  . Cholecystitis, acute with cholelithiasis 10/01/2017    Past Surgical History:  Procedure Laterality Date  . FEET SURGERY    . KNEE ARTHROSCOPY    . LAPAROSCOPY N/A 02/08/2015   Procedure: LAPAROSCOPY DIAGNOSTIC;  Surgeon: Vena Austria, MD;  Location: [facility] ORS;  Service: Gynecology;  Laterality: N/A;  . TUBAL LIGATION      Prior to Admission medications   Medication Sig Start Date End Date Taking? Authorizing Provider  naproxen (NAPROSYN) 500 MG tablet Take 1 tablet (500 mg total) by mouth 2 (two) times daily with a meal. 09/02/17   Joni Reining, PA-C  omeprazole (PRILOSEC) 20 MG capsule Take by mouth.    [provider]  ondansetron  (ZOFRAN ODT) 4 MG disintegrating tablet Take 1 tablet (4 mg total) by mouth every 8 (eight) hours as needed for nausea or vomiting. 09/29/17   Irean Hong, MD  oxyCODONE-acetaminophen (PERCOCET/ROXICET) 5-325 MG tablet Take 1 tablet by mouth every 4 (four) hours as needed for severe pain. 09/29/17   Irean Hong, MD  sulfamethoxazole-trimethoprim (BACTRIM DS,SEPTRA DS) 800-160 MG tablet Take 1 tablet by mouth 2 (two) times daily. 09/02/17   Joni Reining, PA-C    Allergies Penicillins  Family History  Problem Relation Age of Onset  . Diabetes Father   . Diabetes Maternal Aunt   . Breast cancer Neg Hx   . Ovarian cancer Neg Hx   . Colon cancer Neg Hx     Social History Social History   Tobacco Use  . Smoking status: Current Every Day Smoker    Packs/day: 1.00    Years: 7.00    Pack years: 7.00    Types: E-cigarettes  . Smokeless tobacco: Never Used  Substance Use Topics  . Alcohol use: No  . Drug use: No     Review of Systems  Constitutional: No fever/chills Eyes: No visual changes.  Cardiovascular: no chest pain. Respiratory: no cough. No SOB. Gastrointestinal: Positive for severe right upper quadrant pain.  Positive for nausea and vomiting.    No diarrhea.  No constipation. Musculoskeletal: Negative for musculoskeletal pain. Skin: Negative for rash, abrasions, lacerations, ecchymosis. Neurological: Negative for headaches, focal weakness or numbness. 10-point ROS otherwise negative.  ____________________________________________   PHYSICAL EXAM:  VITAL SIGNS: ED Triage Vitals [10/01/17 1523]  Enc Vitals Group     BP (!) 146/88     Pulse Rate 77     Resp 18     Temp 98.5 F (36.9 C)     Temp Source Oral     SpO2 100 %     Weight 145 lb (65.8 kg)     Height  (1.6 m)     Head Circumference      Peak Flow      Pain Score 8     Pain Loc      Pain Edu?      Excl. in GC?      Constitutional: Alert and oriented. Well appearing and in no acute  distress. Eyes: Conjunctivae are normal. PERRL. EOMI. Head: Atraumatic. Neck: No stridor.    Cardiovascular: Normal rate, regular rhythm. Normal S1 and S2.  Good peripheral circulation. Respiratory: Normal respiratory effort without tachypnea or retractions. Lungs CTAB. Good air entry to the bases with no decreased or absent breath sounds. Gastrointestinal: Bowel sounds 4 quadrants.  Abdomen soft to palpation.  Patient is exquisitely tender to palpation right upper quadrant with no other tenderness to palpation in the abdomen.  Positive Murphy sign.  No guarding or rigidity. No palpable masses. No distention. No CVA tenderness. Musculoskeletal: Full range of motion to all extremities. No gross deformities appreciated. Neurologic:  Normal speech and language. No gross focal neurologic deficits are appreciated.  Skin:  Skin is warm, dry and intact. No rash noted. Psychiatric: Mood and affect are normal. Speech and behavior are normal. Patient exhibits appropriate insight and judgement.   ____________________________________________   LABS (all labs ordered are listed, but only abnormal results are displayed)  Labs Reviewed  LIPASE, BLOOD - Abnormal; Notable for the following components:      Result Value   Lipase 76 (*)    All other components within normal limits  URINALYSIS, COMPLETE (UACMP) WITH MICROSCOPIC - Abnormal; Notable for the following components:   Color, Urine YELLOW (*)    APPearance CLOUDY (*)    Bacteria, UA FEW (*)    All other components within normal limits  COMPREHENSIVE METABOLIC PANEL  CBC  POCT PREGNANCY, URINE   ____________________________________________  EKG   ____________________________________________  RADIOLOGY Festus Barren Macie Baum, personally viewed and evaluated these images as part of my medical decision making, as well as reviewing the written report by the radiologist.  US Abdomen Limited Ruq  Result Date: 10/01/2017 CLINICAL DATA:   Right upper quadrant abdominal pain. History of cholelithiasis. EXAM: ULTRASOUND ABDOMEN LIMITED RIGHT UPPER QUADRANT COMPARISON:  09/29/2017 FINDINGS: Gallbladder: A somewhat flat gallstone is present in the gallbladder, measuring 7 mm in long axis. No gallbladder wall thickening or pericholecystic fluid. Sonographic Murphy's sign absent. Common bile duct: Diameter: 3-4 mm.  No directly visualized choledocholithiasis. Liver: No focal lesion identified. Within normal limits in parenchymal echogenicity. Portal vein is patent on color Doppler imaging with normal direction of blood flow towards the liver. IMPRESSION: 1. Cholelithiasis. No gallbladder wall thickening or pericholecystic fluid. No directly visualized choledocholithiasis or biliary dilatation. Sonographic Murphy's sign absent. Electronically Signed   By: Gaylyn Rong M.D.   On: 10/01/2017 17:29    ____________________________________________    PROCEDURES  Procedure(s) performed:    Procedures    Medications  oxyCODONE-acetaminophen (PERCOCET/ROXICET) 5-325 MG per tablet 1 tablet (1 tablet Oral Given 10/01/17 1543)  pantoprazole (PROTONIX) EC tablet 40 mg (  has no administration in time range)  enoxaparin (LOVENOX) injection 40 mg (has no administration in time range)  0.9 %  sodium chloride infusion ( Intravenous New Bag/Given 10/01/17 2018)  acetaminophen (TYLENOL) tablet 650 mg (has no administration in time range)    Or  acetaminophen (TYLENOL) suppository 650 mg (has no administration in time range)  HYDROcodone-acetaminophen (NORCO/VICODIN) 5-325 MG per tablet 1-2 tablet (1 tablet Oral Given 10/01/17 2025)  ketorolac (TORADOL) 30 MG/ML injection 30 mg (has no administration in time range)  ondansetron (ZOFRAN) tablet 4 mg (has no administration in time range)    Or  ondansetron (ZOFRAN) injection 4 mg (has no administration in time range)  ondansetron (ZOFRAN-ODT) disintegrating tablet 4 mg (4 mg Oral Given 10/01/17  1543)  fentaNYL (SUBLIMAZE) injection 100 mcg (100 mcg Intravenous Given 10/01/17 1629)  HYDROmorphone (DILAUDID) injection 1 mg (1 mg Intravenous Given 10/01/17 1802)  promethazine (PHENERGAN) injection 25 mg (25 mg Intravenous Given 10/01/17 1843)     ____________________________________________   INITIAL IMPRESSION / ASSESSMENT AND PLAN / ED COURSE  Pertinent labs & imaging results that were available during my care of the patient were reviewed by me and considered in my medical decision making (see chart for details).  Review of the [place] CSRS was performed in accordance of the [facility] prior to dispensing any controlled drugs.     Patient's diagnosis is consistent with cholelithiasis, biliary colic.  Patient presents the emergency department with worsening right upper quadrant pain.  Patient was seen 3 days ago and diagnosed with biliary colic secondary to cholelithiasis.  Patient had an outpatient follow-up with surgery tomorrow, pain, nausea and vomiting have worsened.  Given patient's symptoms, physical exam findings, repeat labs and ultrasound was performed.  No evidence of gallbladder wall thickening, sonographic Murphy sign.  No leukocytosis.  Patient's pain is not been controlled with oral Percocet, fentanyl, Dilaudid.  I discussed the patient's case with on-call general surgeon, Dr. Earlene Plater.  Dr. Earlene Plater agrees that this patient is not surgical at this time.  He recommends medications for pain.  Patient is unable to tolerate oral intake, significant amounts of pain despite medications in the emergency department.  I discussed the patient's case with hospitalist who agrees to admit the patient for pain relief.  When this is further managed, surgery may be further consulted for ongoing cholelithiasis.  Patient care turned over to hospitalist service.     ____________________________________________  FINAL CLINICAL IMPRESSION(S) / ED DIAGNOSES  Final diagnoses:  RUQ pain  Cholelithiasis         This chart was dictated using voice recognition software/Dragon. Despite best efforts to proofread, errors can occur which can change the meaning. Any change was purely unintentional.    Racheal Patches, PA-C 10/01/17 2143    Sharyn Creamer, MD 10/02/17 [phone]

## 2017-10-01 NOTE — Telephone Encounter (Signed)
Patient has called and stated that she was in a lot of pain-abdominal pain. She has nausea and vomiting she states, with no fever. Patient is taking pain medications as prescribed that is not relieving the pain but a little. Patient did not get the Zofran that was prescribed due to cost. She does have an appointment with Dr Earlene Plater tomorrow 10/02/17. Because patient complains of severe pain and vomiting we have suggested that she go to the [facility]. She states that she would like to keep her appointment still scheduled incase she decides to not go to the ER.

## 2017-10-01 NOTE — ED Notes (Signed)
Hospitalist to bedside at this time 

## 2017-10-02 ENCOUNTER — Encounter: Payer: Self-pay | Admitting: Surgery

## 2017-10-02 ENCOUNTER — Ambulatory Visit (INDEPENDENT_AMBULATORY_CARE_PROVIDER_SITE_OTHER): Payer: Self-pay | Admitting: Surgery

## 2017-10-02 ENCOUNTER — Telehealth: Payer: Self-pay | Admitting: Surgery

## 2017-10-02 DIAGNOSIS — K8020 Calculus of gallbladder without cholecystitis without obstruction: Secondary | ICD-10-CM

## 2017-10-02 NOTE — Progress Notes (Signed)
Surgical Clinic History and Physical  Referring provider:  No referring provider defined for this encounter.  HISTORY OF PRESENT ILLNESS (HPI):  27 y.o. female presents for evaluation of abdominal pain and nausea. Patient tearfully reports she developed RUQ abdominal pain 4 days ago, 30 minutes after she ate pizza. She presented at that time to [facility] ED and underwent RUQ abdominal ultrasound, after which she was diagnosed with cholelithiasis without cholecystitis and discharged home. She then returned to [facility] ED after her pain returned after she ate biscuits, stating she is "unable to keep anything down" because of her experience with pizza and biscuits. She also says she tried taking the Percocet prescribed after her first ED presentation, but now the pain medication seems to "numb" the pain, which then returns and remains constant when the pain medication "wears off".   Surgery was informed of the patient's return to ED and repeat ultrasound findings and recommended to stop giving patient morphine-derived narcotics, give Toradol +/- prn fentanyl, and call if symptoms persist. Instead, patient was admitted to hospitalist service and given more narcotic pain medication. Patient was then discharged this morning for previously scheduled clinic appointment. Patient says she wants her gallbladder removed and to know what she can eat. However, even after discussing the role of fatty foods with symptomatic cholelithiasis, patient's mom again asked "but which fast food can she have?". Patient denies fever/chills, CP, or SOB, but reports non-bloody emesis with the abdominal pain she's been experiencing. She, however, says her GERD has remained controlled with omeprazole she takes daily.  PAST MEDICAL HISTORY (PMH):  Past Medical History:  Diagnosis Date  . Acid reflux   . Asthma    H/O AS A CHILD-NO INHALERS   . Endometriosis   . Headache    MIGRAINES-RARE     PAST SURGICAL HISTORY (PSH):  Past  Surgical History:  Procedure Laterality Date  . FEET SURGERY    . KNEE ARTHROSCOPY    . LAPAROSCOPY N/A 02/08/2015   Procedure: LAPAROSCOPY DIAGNOSTIC;  Surgeon: Vena Austria, MD;  Location: [facility] ORS;  Service: Gynecology;  Laterality: N/A;  . TUBAL LIGATION       MEDICATIONS:  Prior to Admission medications   Medication Sig Start Date End Date Taking? Authorizing Provider  omeprazole (PRILOSEC) 20 MG capsule Take 20 mg by mouth daily.    Yes [provider]  ondansetron (ZOFRAN ODT) 4 MG disintegrating tablet Take 1 tablet (4 mg total) by mouth every 8 (eight) hours as needed for nausea or vomiting. Patient not taking: Reported on 10/02/2017 09/29/17  Yes Irean Hong, MD  oxyCODONE-acetaminophen (PERCOCET/ROXICET) 5-325 MG tablet Take 1 tablet by mouth every 4 (four) hours as needed for severe pain. Patient not taking: Reported on 10/02/2017 09/29/17  Yes Irean Hong, MD     ALLERGIES:  Allergies  Allergen Reactions  . Penicillins Hives    Has patient had a PCN reaction causing immediate rash, facial/tongue/throat swelling, SOB or lightheadedness with hypotension: unkn Has patient had a PCN reaction causing severe rash involving mucus membranes or skin necrosis: no Has patient had a PCN reaction that required hospitalization: no Has patient had a PCN reaction occurring within the last 10 years: no If all of the above answers are "NO", then may proceed with Cephalosporin use.      SOCIAL HISTORY:  Social History   Socioeconomic History  . Marital status: Married    Spouse name: Not on file  . Number of children: Not on file  .  Years of education: Not on file  . Highest education level: Not on file  Occupational History  . Not on file  Social Needs  . Financial resource strain: Not on file  . Food insecurity:    Worry: Not on file    Inability: Not on file  . Transportation needs:    Medical: Not on file    Non-medical: Not on file  Tobacco Use  . Smoking  status: Current Every Day Smoker    Packs/day: 1.00    Years: 7.00    Pack years: 7.00    Types: E-cigarettes  . Smokeless tobacco: Never Used  Substance and Sexual Activity  . Alcohol use: No  . Drug use: No  . Sexual activity: Yes    Birth control/protection: Surgical  Lifestyle  . Physical activity:    Days per week: Not on file    Minutes per session: Not on file  . Stress: Not on file  Relationships  . Social connections:    Talks on phone: Not on file    Gets together: Not on file    Attends religious service: Not on file    Active member of club or organization: Not on file    Attends meetings of clubs or organizations: Not on file    Relationship status: Not on file  . Intimate partner violence:    Fear of current or ex partner: Not on file    Emotionally abused: Not on file    Physically abused: Not on file    Forced sexual activity: Not on file  Other Topics Concern  . Not on file  Social History Narrative  . Not on file    The patient currently resides (home / rehab facility / nursing home): Home The patient normally is (ambulatory / bedbound): Ambulatory  FAMILY HISTORY:  Family History  Problem Relation Age of Onset  . Diabetes Father   . Diabetes Maternal Aunt   . Breast cancer Neg Hx   . Ovarian cancer Neg Hx   . Colon cancer Neg Hx     Otherwise negative/non-contributory.  REVIEW OF SYSTEMS:  Constitutional: denies any other weight loss, fever, chills, or sweats  Eyes: denies any other vision changes, history of eye injury  ENT: denies sore throat, hearing problems  Respiratory: denies shortness of breath, wheezing  Cardiovascular: denies chest pain, palpitations  Gastrointestinal: abdominal pain, N/V, and bowel function as per HPI Musculoskeletal: denies any other joint pains or cramps  Skin: Denies any other rashes or skin discolorations Neurological: denies any other headache, dizziness, weakness  Psychiatric: Denies any other depression,  anxiety   All other review of systems were otherwise negative   VITAL SIGNS:  BP 133/88   Pulse 72   Temp 98.5 F (36.9 C) (Oral)   Ht  (1.6 m)   LMP 09/15/2017 (Exact Date)   BMI 25.69 kg/m   PHYSICAL EXAM:  Constitutional:  -- Normal body habitus  -- Awake, alert, and oriented x3  Eyes:  -- Pupils equally round and reactive to light  -- No scleral icterus  Ear, nose, throat:  -- No jugular venous distension -- No nasal drainage, bleeding Pulmonary:  -- No crackles  -- Equal breath sounds bilaterally -- Breathing non-labored at rest Cardiovascular:  -- S1, S2 present  -- No pericardial rubs  Gastrointestinal:  -- Abdomen soft and non-distended with only mild-/moderate- RUQ tenderness to palpation and no guarding/rebound tenderness -- No abdominal masses appreciated, pulsatile or otherwise  Musculoskeletal and Integumentary:  -- Wounds or skin discoloration: None appreciated -- Extremities: B/L UE and LE FROM, hands and feet warm, no edema  Neurologic:  -- Motor function: Intact and symmetric -- Sensation: Intact and symmetric  Labs:  CBC Latest Ref Rng & Units 10/01/2017 09/29/2017 07/21/2017  WBC 3.6 - 11.0 K/uL 7.1 8.4 7.9  Hemoglobin 12.0 - 16.0 g/dL 62.9 52.8 41.3  Hematocrit 35.0 - 47.0 % 40.8 38.8 45.3  Platelets 150 - 440 K/uL 287 302 312   CMP Latest Ref Rng & Units 10/01/2017 09/29/2017 07/21/2017  Glucose 65 - 99 mg/dL 84 91 96  BUN 6 - 20 mg/dL Creatinine 0.44 - 1.00 mg/dL 2.44 0.10 2.72  Sodium 135 - 145 mmol/L 140 141 141  Potassium 3.5 - 5.1 mmol/L 4.0 3.4(L) 4.2  Chloride 101 - 111 mmol/L 107 108 106  CO2 22 - 32 mmol/L Calcium 8.9 - 10.3 mg/dL 9.3 9.5 9.3  Total Protein 6.5 - 8.1 g/dL 7.4 7.4 -  Total Bilirubin 0.3 - 1.2 mg/dL 0.8 0.8 -  Alkaline Phos 38 - 126 U/L 51 54 -  AST 15 - 41 U/L 17 22 -  ALT 14 - 54 U/L 16 18 -    Imaging studies:  Limited RUQ Abdominal Ultrasound (10/01/2017) Cholelithiasis. No gallbladder  wall thickening or pericholecystic fluid. No directly visualized choledocholithiasis or biliary dilatation. Sonographic Murphy's sign absent.  Limited RUQ Abdominal Ultrasound (09/29/2017) Cholelithiasis without other evidence of acute cholecystitis. The gallbladder is contracted secondary to recent fatty meal.  Assessment/Plan:  27 y.o. female with severely symptomatic cholelithiasis without sonographic evidence of cholecystitis without LFT abnormalities, complicated by co-morbidities including GERD, asthma, migraine headaches, and endometriosis.              - avoid/minimize foods with higher fat content (meats, cheeses/dairy, and fried)             - prefer low-fat vegetables, whole grains (wheat bread, ceareals, etc), and fruits until cholecystectomy             - all risks, benefits, and alternatives to cholecystectomy were discussed with the patient, all of his questions were answered to his expressed satisfaction, patient expresses he wishes to proceed, and informed consent was obtained.             - will plan for laparoscopic cholecystectomy next Tuesday, 6/4 per patient request pending anesthesia and OR availability             - anticipate return to clinic 2 weeks after above planned surgery             - instructed to call if any questions or concerns  Thank you for the opportunity to participate in this patient's care.  -- Scherrie Gerlach Earlene Plater, MD, RPVI [place]: [facility] General Surgery - Partnering for exceptional care. Office: [phone]

## 2017-10-02 NOTE — Telephone Encounter (Signed)
Pt advised of pre op date/time and sx date. Sx: 10/06/17 with Dr Davis-laparoscopic cholecystectomy.  Pre op: 10/05/17 between 9-1:00pm--phone interview.   Patient made aware to call [phone], between 1-3:00pm the day before surgery, to find out what time to arrive.

## 2017-10-02 NOTE — Discharge Summary (Signed)
[facility] - [place] [facility]   PATIENT NAME: Melissa Mcmahon    MR#:  [id number]  DATE OF BIRTH:  Jan 28, 1991  DATE OF ADMISSION:  10/01/2017 ADMITTING PHYSICIAN: Enedina Finner, MD  DATE OF DISCHARGE: 10/02/2017  PRIMARY CARE PHYSICIAN: Satira Mccallum, MD    ADMISSION DIAGNOSIS:  RUQ pain [R10.11] Cholelithiasis [K80.20]  DISCHARGE DIAGNOSIS:  Active Problems:   Cholecystitis, acute with cholelithiasis   SECONDARY DIAGNOSIS:   Past Medical History:  Diagnosis Date  . Acid reflux   . Asthma    H/O AS A CHILD-NO INHALERS   . Endometriosis   . Headache    MIGRAINES-RARE    HOSPITAL COURSE:    27 y/o female with hx acid reflux who presents with RUQ abdominal pain and nausea.  1. Acute cholelithiasis w/o evidence of cholecystitis: She has follow up with Dr Melissa Mcmahon today and wants to make that appointment. She does not wish to stay in the hospital over the weekend. D/w Dr Melissa Mcmahon.  2. intractable nausea vomiting secondary to #1. Dr Melissa Mcmahon says he will try to assist with medications as patient cannot afford Zofran.     DISCHARGE CONDITIONS AND DIET:  Stable Diet as tolerated  CONSULTS OBTAINED:  Treatment Team:  Ancil Linsey, MD Star Age, DO  DRUG ALLERGIES:   Allergies  Allergen Reactions  . Penicillins Hives    DISCHARGE MEDICATIONS:   Allergies as of 10/02/2017      Reactions   Penicillins Hives      Medication List    STOP taking these medications   naproxen 500 MG tablet Commonly known as:  NAPROSYN   sulfamethoxazole-trimethoprim 800-160 MG tablet Commonly known as:  BACTRIM DS,SEPTRA DS     TAKE these medications   omeprazole 20 MG capsule Commonly known as:  PRILOSEC Take by mouth.   ondansetron 4 MG disintegrating tablet Commonly known as:  ZOFRAN ODT Take 1 tablet (4 mg total) by mouth every 8 (eight) hours as needed for nausea or vomiting.   oxyCODONE-acetaminophen 5-325 MG tablet Commonly known as:   PERCOCET/ROXICET Take 1 tablet by mouth every 4 (four) hours as needed for severe pain.         Today   CHIEF COMPLAINT:  Wants to go home and make appointment today with dr Melissa Mcmahon   VITAL SIGNS:  Blood pressure 113/65, pulse 60, temperature 97.8 F (36.6 C), temperature source Oral, resp. rate 18, height  (1.6 m), weight 65.8 kg (145 lb), last menstrual period 09/15/2017, SpO2 98 %.   REVIEW OF SYSTEMS:  Review of Systems  Constitutional: Negative.  Negative for chills, fever and malaise/fatigue.  HENT: Negative.  Negative for ear discharge, ear pain, hearing loss, nosebleeds and sore throat.   Eyes: Negative.  Negative for blurred vision and pain.  Respiratory: Negative.  Negative for cough, hemoptysis, shortness of breath and wheezing.   Cardiovascular: Negative.  Negative for chest pain, palpitations and leg swelling.  Gastrointestinal: Positive for abdominal pain, nausea and vomiting. Negative for blood in stool and diarrhea.  Genitourinary: Negative.  Negative for dysuria.  Musculoskeletal: Negative.  Negative for back pain.  Skin: Negative.   Neurological: Negative for dizziness, tremors, speech change, focal weakness, seizures and headaches.  Endo/Heme/Allergies: Negative.  Does not bruise/bleed easily.  Psychiatric/Behavioral: Negative.  Negative for depression, hallucinations and suicidal ideas.     PHYSICAL EXAMINATION:  GENERAL:  27 y.o.-year-old patient lying in the bed with no acute distress.  NECK:  Supple, no  jugular venous distention. No thyroid enlargement, no tenderness.  LUNGS: Normal breath sounds bilaterally, no wheezing, rales,rhonchi  No use of accessory muscles of respiration.  CARDIOVASCULAR: S1, S2 normal. No murmurs, rubs, or gallops.  ABDOMEN: Soft, RUQ tenderness no guarding Bowel sounds present. No organomegaly or mass.  EXTREMITIES: No pedal edema, cyanosis, or clubbing.  PSYCHIATRIC: The patient is alert and oriented x 3.  SKIN: No  obvious rash, lesion, or ulcer.   DATA REVIEW:   CBC Recent Labs  Lab 10/01/17 0524  WBC 7.1  HGB 14.0  HCT 40.8  PLT 287    Chemistries  Recent Labs  Lab 10/01/17 0524  NA 140  K 4.0  CL 107  CO2 25  GLUCOSE 84  BUN 11  CREATININE 0.78  CALCIUM 9.3  AST 17  ALT 16  ALKPHOS 51  BILITOT 0.8    Cardiac Enzymes No results for input(s): TROPONINI in the last 168 hours.  Microbiology Results  @  RADIOLOGY:  US Abdomen Limited Ruq  Result Date: 10/01/2017 CLINICAL DATA:  Right upper quadrant abdominal pain. History of cholelithiasis. EXAM: ULTRASOUND ABDOMEN LIMITED RIGHT UPPER QUADRANT COMPARISON:  09/29/2017 FINDINGS: Gallbladder: A somewhat flat gallstone is present in the gallbladder, measuring 7 mm in long axis. No gallbladder wall thickening or pericholecystic fluid. Sonographic Murphy's sign absent. Common bile duct: Diameter: 3-4 mm.  No directly visualized choledocholithiasis. Liver: No focal lesion identified. Within normal limits in parenchymal echogenicity. Portal vein is patent on color Doppler imaging with normal direction of blood flow towards the liver. IMPRESSION: 1. Cholelithiasis. No gallbladder wall thickening or pericholecystic fluid. No directly visualized choledocholithiasis or biliary dilatation. Sonographic Murphy's sign absent. Electronically Signed   By: Gaylyn Rong M.D.   On: 10/01/2017 17:29      Allergies as of 10/02/2017      Reactions   Penicillins Hives      Medication List    STOP taking these medications   naproxen 500 MG tablet Commonly known as:  NAPROSYN   sulfamethoxazole-trimethoprim 800-160 MG tablet Commonly known as:  BACTRIM DS,SEPTRA DS     TAKE these medications   omeprazole 20 MG capsule Commonly known as:  PRILOSEC Take by mouth.   ondansetron 4 MG disintegrating tablet Commonly known as:  ZOFRAN ODT Take 1 tablet (4 mg total) by mouth every 8 (eight) hours as needed for nausea or  vomiting.   oxyCODONE-acetaminophen 5-325 MG tablet Commonly known as:  PERCOCET/ROXICET Take 1 tablet by mouth every 4 (four) hours as needed for severe pain.         Management plans discussed with the patient and she is in agreement. Stable for discharge home  Patient should follow up with dr Melissa Mcmahon  CODE STATUS:     Code Status Orders  (From admission, onward)        Start     Ordered   10/01/17 2004  Full code  Continuous     10/01/17 2003    Code Status History    This patient has a current code status but no historical code status.      TOTAL TIME TAKING CARE OF THIS PATIENT: 38 minutes.    Note: This dictation was prepared with Dragon dictation along with smaller phrase technology. Any transcriptional errors that result from this process are unintentional.  Jessenya Berdan M.D on 10/02/2017 at 9:28 AM  Between 7am to 6pm - Pager - [phone] After 6pm go to [email or web address] - password [email or web address]  [facility] [facility]  Office  [phone]  CC: Primary care physician; Satira Mccallum

## 2017-10-02 NOTE — Progress Notes (Signed)
Discharge instructions reviewed with patient and mother.  Understanding was verbalized and all questions were answered.  Zofran given to patient per Dr. Mody just Juliene Pinarior to discharge.  Patient discharged in time to attend previously scheduled appointment with Dr. Earlene Plater today at 10:30 a.m.

## 2017-10-02 NOTE — Patient Instructions (Signed)
You have requested to have your gallbladder removed. This will be done on 10/06/17 at [facility] with Dr. Satira Mccallum.  You will most likely be out of work 1-2 weeks for this surgery. You will return after your post-op appointment with a lifting restriction for approximately 4 more weeks.  You will be able to eat anything you would like to following surgery. But, start by eating a bland diet and advance this as tolerated. The Gallbladder diet is below, please go as closely by this diet as possible prior to surgery to avoid any further attacks.  Please see the (blue)pre-care form that you have been given today. If you have any questions, please call our office.  Laparoscopic Cholecystectomy Laparoscopic cholecystectomy is surgery to remove the gallbladder. The gallbladder is located in the upper right part of the abdomen, behind the liver. It is a storage sac for bile, which is produced in the liver. Bile aids in the digestion and absorption of fats. Cholecystectomy is often done for inflammation of the gallbladder (cholecystitis). This condition is usually caused by a buildup of gallstones (cholelithiasis) in the gallbladder. Gallstones can block the flow of bile, and that can result in inflammation and pain. In severe cases, emergency surgery may be required. If emergency surgery is not required, you will have time to prepare for the procedure. Laparoscopic surgery is an alternative to open surgery. Laparoscopic surgery has a shorter recovery time. Your common bile duct may also need to be examined during the procedure. If stones are found in the common bile duct, they may be removed. LET [facility] CARE PROVIDER KNOW ABOUT:  Any allergies you have.  All medicines you are taking, including vitamins, herbs, eye drops, creams, and over-the-counter medicines.  Previous problems you or members of your family have had with the use of anesthetics.  Any blood disorders you have.  Previous  surgeries you have had.    Any medical conditions you have. RISKS AND COMPLICATIONS Generally, this is a safe procedure. However, problems may occur, including:  Infection.  Bleeding.  Allergic reactions to medicines.  Damage to other structures or organs.  A stone remaining in the common bile duct.  A bile leak from the cyst duct that is clipped when your gallbladder is removed.  The need to convert to open surgery, which requires a larger incision in the abdomen. This may be necessary if your surgeon thinks that it is not safe to continue with a laparoscopic procedure. BEFORE THE PROCEDURE  Ask your health care provider about:  Changing or stopping your regular medicines. This is especially important if you are taking diabetes medicines or blood thinners.  Taking medicines such as aspirin and ibuprofen. These medicines can thin your blood. Do not take these medicines before your procedure if your health care provider instructs you not to.  Follow instructions from your health care provider about eating or drinking restrictions.  Let your health care provider know if you develop a cold or an infection before surgery.  Plan to have someone take you home after the procedure.  Ask your health care provider how your surgical site will be marked or identified.  You may be given antibiotic medicine to help prevent infection. PROCEDURE  To reduce your risk of infection:  Your health care team will wash or sanitize their hands.  Your skin will be washed with soap.  An IV tube may be inserted into one of your veins.  You will be given a medicine to  make you fall asleep (general anesthetic).  A breathing tube will be placed in your mouth.  The surgeon will make several small cuts (incisions) in your abdomen.  A thin, lighted tube (laparoscope) that has a tiny camera on the end will be inserted through one of the small incisions. The camera on the laparoscope will send a  picture to a TV screen (monitor) in the operating room. This will give the surgeon a good view inside your abdomen.  A gas will be pumped into your abdomen. This will expand your abdomen to give the surgeon more room to perform the surgery.  Other tools that are needed for the procedure will be inserted through the other incisions. The gallbladder will be removed through one of the incisions.  After your gallbladder has been removed, the incisions will be closed with stitches (sutures), staples, or skin glue.  Your incisions may be covered with a bandage (dressing). The procedure may vary among health care providers and hospitals. AFTER THE PROCEDURE  Your blood pressure, heart rate, breathing rate, and blood oxygen level will be monitored often until the medicines you were given have worn off.  You will be given medicines as needed to control your pain.   This information is not intended to replace advice given to you by your health care provider. Make sure you discuss any questions you have with your health care provider.   Document Released: 04/21/2005 Document Revised: 01/10/2015 Document Reviewed: 12/01/2012 Elsevier Interactive Patient Education 2016 Elsevier Inc.   Low-Fat Diet for Gallbladder Conditions A low-fat diet can be helpful if you have pancreatitis or a gallbladder condition. With these conditions, your pancreas and gallbladder have trouble digesting fats. A healthy eating plan with less fat will help rest your pancreas and gallbladder and reduce your symptoms. WHAT DO I NEED TO KNOW ABOUT THIS DIET?  Eat a low-fat diet.  Reduce your fat intake to less than 20-30% of your total daily calories. This is less than 50-60 g of fat per day.  Remember that you need some fat in your diet. Ask your dietician what your daily goal should be.  Choose nonfat and low-fat healthy foods. Look for the words "nonfat," "low fat," or "fat free."  As a guide, look on the label and  choose foods with less than 3 g of fat per serving. Eat only one serving.  Avoid alcohol.  Do not smoke. If you need help quitting, talk with your health care provider.  Eat small frequent meals instead of three large heavy meals. WHAT FOODS CAN I EAT? Grains Include healthy grains and starches such as potatoes, wheat bread, fiber-rich cereal, and brown rice. Choose whole grain options whenever possible. In adults, whole grains should account for 45-65% of your daily calories.  Fruits and Vegetables Eat plenty of fruits and vegetables. Fresh fruits and vegetables add fiber to your diet. Meats and Other Protein Sources Eat lean meat such as chicken and pork. Trim any fat off of meat before cooking it. Eggs, fish, and beans are other sources of protein. In adults, these foods should account for 10-35% of your daily calories. Dairy Choose low-fat milk and dairy options. Dairy includes fat and protein, as well as calcium.  Fats and Oils Limit high-fat foods such as fried foods, sweets, baked goods, sugary drinks.  Other Creamy sauces and condiments, such as mayonnaise, can add extra fat. Think about whether or not you need to use them, or use smaller amounts or low fat  options. WHAT FOODS ARE NOT RECOMMENDED?  High fat foods, such as:  [employer].  Ice cream.  [place] toast.  Sweet rolls.  Pizza.  Cheese bread.  Foods covered with batter, butter, creamy sauces, or cheese.  Fried foods.  Sugary drinks and desserts.  Foods that cause gas or bloating   This information is not intended to replace advice given to you by your health care provider. Make sure you discuss any questions you have with your health care provider.   Document Released: 04/26/2013 Document Reviewed: 04/26/2013 Elsevier Interactive Patient Education [employer]

## 2017-10-02 NOTE — H&P (View-Only) (Signed)
Surgical Clinic History and Physical  Referring provider:  No referring provider defined for this encounter.  HISTORY OF PRESENT ILLNESS (HPI):  27 y.o. female presents for evaluation of abdominal pain and nausea. Patient tearfully reports she developed RUQ abdominal pain 4 days ago, 30 minutes after she ate pizza. She presented at that time to [facility] ED and underwent RUQ abdominal ultrasound, after which she was diagnosed with cholelithiasis without cholecystitis and discharged home. She then returned to [facility] ED after her pain returned after she ate biscuits, stating she is "unable to keep anything down" because of her experience with pizza and biscuits. She also says she tried taking the Percocet prescribed after her first ED presentation, but now the pain medication seems to "numb" the pain, which then returns and remains constant when the pain medication "wears off".   Surgery was informed of the patient's return to ED and repeat ultrasound findings and recommended to stop giving patient morphine-derived narcotics, give Toradol +/- prn fentanyl, and call if symptoms persist. Instead, patient was admitted to hospitalist service and given more narcotic pain medication. Patient was then discharged this morning for previously scheduled clinic appointment. Patient says she wants her gallbladder removed and to know what she can eat. However, even after discussing the role of fatty foods with symptomatic cholelithiasis, patient's mom again asked "but which fast food can she have?". Patient denies fever/chills, CP, or SOB, but reports non-bloody emesis with the abdominal pain she's been experiencing. She, however, says her GERD has remained controlled with omeprazole she takes daily.  PAST MEDICAL HISTORY (PMH):  Past Medical History:  Diagnosis Date  . Acid reflux   . Asthma    H/O AS A CHILD-NO INHALERS   . Endometriosis   . Headache    MIGRAINES-RARE     PAST SURGICAL HISTORY (PSH):  Past  Surgical History:  Procedure Laterality Date  . FEET SURGERY    . KNEE ARTHROSCOPY    . LAPAROSCOPY N/A 02/08/2015   Procedure: LAPAROSCOPY DIAGNOSTIC;  Surgeon: Andreas Staebler, MD;  Location: [facility] ORS;  Service: Gynecology;  Laterality: N/A;  . TUBAL LIGATION       MEDICATIONS:  Prior to Admission medications   Medication Sig Start Date End Date Taking? Authorizing Provider  omeprazole (PRILOSEC) 20 MG capsule Take 20 mg by mouth daily.    Yes [provider]  ondansetron (ZOFRAN ODT) 4 MG disintegrating tablet Take 1 tablet (4 mg total) by mouth every 8 (eight) hours as needed for nausea or vomiting. Patient not taking: Reported on 10/02/2017 09/29/17  Yes Sung, Jade J, MD  oxyCODONE-acetaminophen (PERCOCET/ROXICET) 5-325 MG tablet Take 1 tablet by mouth every 4 (four) hours as needed for severe pain. Patient not taking: Reported on 10/02/2017 09/29/17  Yes Sung, Jade J, MD     ALLERGIES:  Allergies  Allergen Reactions  . Penicillins Hives    Has patient had a PCN reaction causing immediate rash, facial/tongue/throat swelling, SOB or lightheadedness with hypotension: unkn Has patient had a PCN reaction causing severe rash involving mucus membranes or skin necrosis: no Has patient had a PCN reaction that required hospitalization: no Has patient had a PCN reaction occurring within the last 10 years: no If all of the above answers are "NO", then may proceed with Cephalosporin use.      SOCIAL HISTORY:  Social History   Socioeconomic History  . Marital status: Married    Spouse name: Not on file  . Number of children: Not on file  .   Years of education: Not on file  . Highest education level: Not on file  Occupational History  . Not on file  Social Needs  . Financial resource strain: Not on file  . Food insecurity:    Worry: Not on file    Inability: Not on file  . Transportation needs:    Medical: Not on file    Non-medical: Not on file  Tobacco Use  . Smoking  status: Current Every Day Smoker    Packs/day: 1.00    Years: 7.00    Pack years: 7.00    Types: E-cigarettes  . Smokeless tobacco: Never Used  Substance and Sexual Activity  . Alcohol use: No  . Drug use: No  . Sexual activity: Yes    Birth control/protection: Surgical  Lifestyle  . Physical activity:    Days per week: Not on file    Minutes per session: Not on file  . Stress: Not on file  Relationships  . Social connections:    Talks on phone: Not on file    Gets together: Not on file    Attends religious service: Not on file    Active member of club or organization: Not on file    Attends meetings of clubs or organizations: Not on file    Relationship status: Not on file  . Intimate partner violence:    Fear of current or ex partner: Not on file    Emotionally abused: Not on file    Physically abused: Not on file    Forced sexual activity: Not on file  Other Topics Concern  . Not on file  Social History Narrative  . Not on file    The patient currently resides (home / rehab facility / nursing home): Home The patient normally is (ambulatory / bedbound): Ambulatory  FAMILY HISTORY:  Family History  Problem Relation Age of Onset  . Diabetes Father   . Diabetes Maternal Aunt   . Breast cancer Neg Hx   . Ovarian cancer Neg Hx   . Colon cancer Neg Hx     Otherwise negative/non-contributory.  REVIEW OF SYSTEMS:  Constitutional: denies any other weight loss, fever, chills, or sweats  Eyes: denies any other vision changes, history of eye injury  ENT: denies sore throat, hearing problems  Respiratory: denies shortness of breath, wheezing  Cardiovascular: denies chest pain, palpitations  Gastrointestinal: abdominal pain, N/V, and bowel function as per HPI Musculoskeletal: denies any other joint pains or cramps  Skin: Denies any other rashes or skin discolorations Neurological: denies any other headache, dizziness, weakness  Psychiatric: Denies any other depression,  anxiety   All other review of systems were otherwise negative   VITAL SIGNS:  BP 133/88   Pulse 72   Temp 98.5 F (36.9 C) (Oral)   Ht 5' 3" (1.6 m)   LMP 09/15/2017 (Exact Date)   BMI 25.69 kg/m   PHYSICAL EXAM:  Constitutional:  -- Normal body habitus  -- Awake, alert, and oriented x3  Eyes:  -- Pupils equally round and reactive to light  -- No scleral icterus  Ear, nose, throat:  -- No jugular venous distension -- No nasal drainage, bleeding Pulmonary:  -- No crackles  -- Equal breath sounds bilaterally -- Breathing non-labored at rest Cardiovascular:  -- S1, S2 present  -- No pericardial rubs  Gastrointestinal:  -- Abdomen soft and non-distended with only mild-/moderate- RUQ tenderness to palpation and no guarding/rebound tenderness -- No abdominal masses appreciated, pulsatile or otherwise    Musculoskeletal and Integumentary:  -- Wounds or skin discoloration: None appreciated -- Extremities: B/L UE and LE FROM, hands and feet warm, no edema  Neurologic:  -- Motor function: Intact and symmetric -- Sensation: Intact and symmetric  Labs:  CBC Latest Ref Rng & Units 10/01/2017 09/29/2017 07/21/2017  WBC 3.6 - 11.0 K/uL 7.1 8.4 7.9  Hemoglobin 12.0 - 16.0 g/dL 14.0 13.1 14.7  Hematocrit 35.0 - 47.0 % 40.8 38.8 45.3  Platelets 150 - 440 K/uL 287 302 312   CMP Latest Ref Rng & Units 10/01/2017 09/29/2017 07/21/2017  Glucose 65 - 99 mg/dL 84 91 96  BUN 6 - 20 mg/dL 11 11 16  Creatinine 0.44 - 1.00 mg/dL 0.78 0.65 0.73  Sodium 135 - 145 mmol/L 140 141 141  Potassium 3.5 - 5.1 mmol/L 4.0 3.4(L) 4.2  Chloride 101 - 111 mmol/L 107 108 106  CO2 22 - 32 mmol/L 25 26 25  Calcium 8.9 - 10.3 mg/dL 9.3 9.5 9.3  Total Protein 6.5 - 8.1 g/dL 7.4 7.4 -  Total Bilirubin 0.3 - 1.2 mg/dL 0.8 0.8 -  Alkaline Phos 38 - 126 U/L 51 54 -  AST 15 - 41 U/L 17 22 -  ALT 14 - 54 U/L 16 18 -    Imaging studies:  Limited RUQ Abdominal Ultrasound (10/01/2017) Cholelithiasis. No gallbladder  wall thickening or pericholecystic fluid. No directly visualized choledocholithiasis or biliary dilatation. Sonographic Murphy's sign absent.  Limited RUQ Abdominal Ultrasound (09/29/2017) Cholelithiasis without other evidence of acute cholecystitis. The gallbladder is contracted secondary to recent fatty meal.  Assessment/Plan:  26 y.o. female with severely symptomatic cholelithiasis without sonographic evidence of cholecystitis without LFT abnormalities, complicated by co-morbidities including GERD, asthma, migraine headaches, and endometriosis.              - avoid/minimize foods with higher fat content (meats, cheeses/dairy, and fried)             - prefer low-fat vegetables, whole grains (wheat bread, ceareals, etc), and fruits until cholecystectomy             - all risks, benefits, and alternatives to cholecystectomy were discussed with the patient, all of his questions were answered to his expressed satisfaction, patient expresses he wishes to proceed, and informed consent was obtained.             - will plan for laparoscopic cholecystectomy next Tuesday, 6/4 per patient request pending anesthesia and OR availability             - anticipate return to clinic 2 weeks after above planned surgery             - instructed to call if any questions or concerns  Thank you for the opportunity to participate in this patient's care.  -- Sontee Desena E. Salvador Bigbee, MD, RPVI [place]: [facility] General Surgery - Partnering for exceptional care. Office: [phone] 

## 2017-10-03 NOTE — Addendum Note (Signed)
Addended by: Chrisandra NettersAVIS, Jaquaya Coyle E on: 10/03/2017 11:19 PM   Modules accepted: Orders, SmartSet

## 2017-10-05 ENCOUNTER — Encounter
Admission: RE | Admit: 2017-10-05 | Discharge: 2017-10-05 | Disposition: A | Discharge status: Home / Self Care | Payer: Commercial Managed Care - PPO | Source: Ambulatory Visit | Attending: Surgery | Admitting: Surgery

## 2017-10-05 ENCOUNTER — Other Ambulatory Visit: Payer: Self-pay

## 2017-10-05 ENCOUNTER — Telehealth: Payer: Self-pay | Admitting: Surgery

## 2017-10-05 NOTE — Patient Instructions (Signed)
Your procedure is scheduled on: 10-07-17  Report to Same Day Surgery 2nd floor medical mall [facility](Medical Mall Entrance-take elevator on left to 2nd floor.  Check in with surgery information desk.) To find out your arrival time please call [phone] between 1PM - 3PM on 10-06-17   Remember: Instructions that are not followed completely may result in serious medical risk, up to and including death, or upon the discretion of your surgeon and anesthesiologist your surgery may need to be rescheduled.    _x___ 1. Do not eat food after midnight the night before your procedure. NO GUM OR CANDY AFTER MIDNIGHT. You may drink clear liquids up to 2 hours before you are scheduled to arrive at the hospital for your procedure.  Do not drink clear liquids within 2 hours of your scheduled arrival to the hospital.  Clear liquids include  --Water or Apple juice without pulp  --Clear carbohydrate beverage such as ClearFast or Gatorade  --Black Coffee or Clear Tea (No milk, no creamers, do not add anything to the coffee or Tea    __x__ 2. No Alcohol for 24 hours before or after surgery.   __x__3. No Smoking or e-cigarettes for 24 prior to surgery.  Do not use any chewable tobacco products for at least 6 hour prior to surgery   ____  4. Bring all medications with you on the day of surgery if instructed.    __x__ 5. Notify your doctor if there is any change in your medical condition     (cold, fever, infections).    x___6. On the morning of surgery brush your teeth with toothpaste and water.  You may rinse your mouth with mouth wash if you wish.  Do not swallow any toothpaste or mouthwash.   Do not wear jewelry, make-up, hairpins, clips or nail polish.  Do not wear lotions, powders, or perfumes. You may wear deodorant.  Do not shave 48 hours prior to surgery. Men may shave face and neck.  Do not bring valuables to the hospital.    [facility] is not responsible for any belongings or valuables.    Contacts, dentures or bridgework may not be worn into surgery.  Leave your suitcase in the car. After surgery it may be brought to your room.  For patients admitted to the hospital, discharge time is determined by your treatment team.  _  Patients discharged the day of surgery will not be allowed to drive home.  You will need someone to drive you home and stay with you the night of your procedure.    Please read over the following fact sheets that you were given:   [facility] Preparing for Surgery and or MRSA Information   _x___ TAKE THE FOLLOWING MEDICATION THE MORNING OF SURGERY WITH A SMALL SIP OF WATER. These include:  1. OMEPRAZOLE  2. TAKE AN OMEPRAZOLE THE NIGHT BEFORE YOUR SURGERY  3.  4.  5.  6.  ____Fleets enema or Magnesium Citrate as directed.   ____ Use CHG Soap or sage wipes as directed on instruction sheet   ____ Use inhalers on the day of surgery and bring to hospital day of surgery  ____ Stop Metformin and Janumet 2 days prior to surgery.    ____ Take 1/2 of usual insulin dose the night before surgery and none on the morning     surgery.   ____ Follow recommendations from Cardiologist, Pulmonologist or PCP regarding stopping Aspirin, Coumadin, Plavix ,Eliquis, Effient, or Pradaxa, and Pletal.  X____Stop Anti-inflammatories such as Advil, Aleve, Ibuprofen, Motrin, Naproxen, Naprosyn, Goodies powders or aspirin products NOW-OK to take Tylenol    ____ Stop supplements until after surgery.     ____ Bring C-Pap to the hospital.

## 2017-10-05 NOTE — Telephone Encounter (Signed)
I have called patient to advise her that her surgery date has changed. Patient was ok with the change of date.   Pt advised of pre op date/time and sx date. Sx: 10/07/17 with Dr Davis-laparoscopic cholecystectomy.  Pre op: 10/05/17 between 9-1:00-phone interview.   Patient made aware to call [phone], between 1-3:00pm the day before surgery, to find out what time to arrive.

## 2017-10-06 MED ORDER — CIPROFLOXACIN IN D5W 400 MG/200ML IV SOLN
400.0000 mg | INTRAVENOUS | Status: AC
  Administered 2017-10-07: 400 mg via INTRAVENOUS

## 2017-10-07 ENCOUNTER — Ambulatory Visit: Payer: Commercial Managed Care - PPO | Admitting: Anesthesiology

## 2017-10-07 ENCOUNTER — Ambulatory Visit
Admission: RE | Admit: 2017-10-07 | Discharge: 2017-10-07 | Disposition: A | Discharge status: Home / Self Care | Payer: Commercial Managed Care - PPO | Source: Ambulatory Visit | Attending: Surgery | Admitting: Surgery

## 2017-10-07 ENCOUNTER — Encounter
Admission: RE | Disposition: A | Discharge status: Home / Self Care | Payer: Self-pay | Source: Ambulatory Visit | Attending: Surgery

## 2017-10-07 DIAGNOSIS — K219 Gastro-esophageal reflux disease without esophagitis: Secondary | ICD-10-CM | POA: Insufficient documentation

## 2017-10-07 DIAGNOSIS — R109 Unspecified abdominal pain: Secondary | ICD-10-CM | POA: Diagnosis present

## 2017-10-07 DIAGNOSIS — F17210 Nicotine dependence, cigarettes, uncomplicated: Secondary | ICD-10-CM | POA: Insufficient documentation

## 2017-10-07 DIAGNOSIS — K8020 Calculus of gallbladder without cholecystitis without obstruction: Secondary | ICD-10-CM

## 2017-10-07 HISTORY — PX: CHOLECYSTECTOMY: SHX55

## 2017-10-07 LAB — POCT PREGNANCY, URINE: PREG TEST UR: NEGATIVE

## 2017-10-07 SURGERY — LAPAROSCOPIC CHOLECYSTECTOMY
Anesthesia: General | Wound class: Clean Contaminated

## 2017-10-07 MED ORDER — SUGAMMADEX SODIUM 200 MG/2ML IV SOLN
INTRAVENOUS | Status: AC
  Filled 2017-10-07: qty 2

## 2017-10-07 MED ORDER — PROPOFOL 10 MG/ML IV BOLUS
INTRAVENOUS | Status: DC | PRN
  Administered 2017-10-07: 150 mg via INTRAVENOUS

## 2017-10-07 MED ORDER — ACETAMINOPHEN 500 MG PO TABS
ORAL_TABLET | ORAL | Status: AC
  Administered 2017-10-07: 1000 mg via ORAL
  Filled 2017-10-07: qty 2

## 2017-10-07 MED ORDER — SUGAMMADEX SODIUM 200 MG/2ML IV SOLN
INTRAVENOUS | Status: DC | PRN
  Administered 2017-10-07: 200 mg via INTRAVENOUS

## 2017-10-07 MED ORDER — ONDANSETRON HCL 4 MG/2ML IJ SOLN: 4.0000 mg | Freq: Once | INTRAMUSCULAR | Status: DC | PRN

## 2017-10-07 MED ORDER — FENTANYL CITRATE (PF) 100 MCG/2ML IJ SOLN: 25.0000 ug | INTRAMUSCULAR | Status: DC | PRN

## 2017-10-07 MED ORDER — LACTATED RINGERS IV SOLN
INTRAVENOUS | Status: DC
  Administered 2017-10-07: 07:00:00 via INTRAVENOUS

## 2017-10-07 MED ORDER — ACETAMINOPHEN 500 MG PO TABS
1000.0000 mg | ORAL_TABLET | ORAL | Status: AC
  Administered 2017-10-07: 1000 mg via ORAL

## 2017-10-07 MED ORDER — GABAPENTIN 300 MG PO CAPS
300.0000 mg | ORAL_CAPSULE | ORAL | Status: AC
  Administered 2017-10-07: 300 mg via ORAL

## 2017-10-07 MED ORDER — ROCURONIUM BROMIDE 100 MG/10ML IV SOLN
INTRAVENOUS | Status: DC | PRN
  Administered 2017-10-07: 50 mg via INTRAVENOUS

## 2017-10-07 MED ORDER — GABAPENTIN 300 MG PO CAPS
ORAL_CAPSULE | ORAL | Status: AC
  Administered 2017-10-07: 300 mg via ORAL
  Filled 2017-10-07: qty 1

## 2017-10-07 MED ORDER — DEXAMETHASONE SODIUM PHOSPHATE 10 MG/ML IJ SOLN
INTRAMUSCULAR | Status: AC
  Filled 2017-10-07: qty 1

## 2017-10-07 MED ORDER — CHLORHEXIDINE GLUCONATE CLOTH 2 % EX PADS: 6.0000 | MEDICATED_PAD | Freq: Once | CUTANEOUS | Status: DC

## 2017-10-07 MED ORDER — KETAMINE HCL 10 MG/ML IJ SOLN
INTRAMUSCULAR | Status: DC | PRN
  Administered 2017-10-07 (×2): 25 mg via INTRAVENOUS

## 2017-10-07 MED ORDER — DEXAMETHASONE SODIUM PHOSPHATE 10 MG/ML IJ SOLN
INTRAMUSCULAR | Status: DC | PRN
  Administered 2017-10-07: 10 mg via INTRAVENOUS

## 2017-10-07 MED ORDER — BUPIVACAINE HCL (PF) 0.5 % IJ SOLN
INTRAMUSCULAR | Status: AC
  Filled 2017-10-07: qty 30

## 2017-10-07 MED ORDER — PROPOFOL 10 MG/ML IV BOLUS
INTRAVENOUS | Status: AC
  Filled 2017-10-07: qty 40

## 2017-10-07 MED ORDER — MIDAZOLAM HCL 5 MG/5ML IJ SOLN
INTRAMUSCULAR | Status: AC
  Filled 2017-10-07: qty 5

## 2017-10-07 MED ORDER — HYDROMORPHONE HCL 1 MG/ML IJ SOLN
INTRAMUSCULAR | Status: AC
  Filled 2017-10-07: qty 2

## 2017-10-07 MED ORDER — KETOROLAC TROMETHAMINE 30 MG/ML IJ SOLN
INTRAMUSCULAR | Status: AC
  Filled 2017-10-07: qty 1

## 2017-10-07 MED ORDER — OXYCODONE-ACETAMINOPHEN 5-325 MG PO TABS: 1.0000 | ORAL_TABLET | ORAL | 0 refills | Status: DC | PRN

## 2017-10-07 MED ORDER — LIDOCAINE HCL (PF) 1 % IJ SOLN
INTRAMUSCULAR | Status: AC
  Filled 2017-10-07: qty 30

## 2017-10-07 MED ORDER — KETAMINE HCL 50 MG/ML IJ SOLN
INTRAMUSCULAR | Status: AC
  Filled 2017-10-07: qty 10

## 2017-10-07 MED ORDER — ONDANSETRON HCL 4 MG/2ML IJ SOLN
INTRAMUSCULAR | Status: DC | PRN
  Administered 2017-10-07: 4 mg via INTRAVENOUS

## 2017-10-07 MED ORDER — CIPROFLOXACIN IN D5W 400 MG/200ML IV SOLN
INTRAVENOUS | Status: AC
  Filled 2017-10-07: qty 200

## 2017-10-07 MED ORDER — ARTIFICIAL TEARS OPHTHALMIC OINT
TOPICAL_OINTMENT | OPHTHALMIC | Status: AC
  Filled 2017-10-07: qty 3.5

## 2017-10-07 MED ORDER — HYDROMORPHONE HCL 1 MG/ML IJ SOLN
INTRAMUSCULAR | Status: DC | PRN
  Administered 2017-10-07: 2 mg via INTRAVENOUS

## 2017-10-07 MED ORDER — GLYCOPYRROLATE 0.2 MG/ML IJ SOLN
INTRAMUSCULAR | Status: DC | PRN
  Administered 2017-10-07: 0.1 mg via INTRAVENOUS

## 2017-10-07 MED ORDER — ONDANSETRON HCL 4 MG/2ML IJ SOLN
INTRAMUSCULAR | Status: AC
  Filled 2017-10-07: qty 2

## 2017-10-07 MED ORDER — LIDOCAINE HCL (PF) 2 % IJ SOLN
INTRAMUSCULAR | Status: AC
  Filled 2017-10-07: qty 10

## 2017-10-07 MED ORDER — LIDOCAINE HCL 1 % IJ SOLN
INTRAMUSCULAR | Status: DC | PRN
  Administered 2017-10-07: 30 mL

## 2017-10-07 MED ORDER — GLYCOPYRROLATE 0.2 MG/ML IJ SOLN
INTRAMUSCULAR | Status: AC
  Filled 2017-10-07: qty 1

## 2017-10-07 MED ORDER — LIDOCAINE HCL (CARDIAC) PF 100 MG/5ML IV SOSY
PREFILLED_SYRINGE | INTRAVENOUS | Status: DC | PRN
  Administered 2017-10-07: 50 mg via INTRAVENOUS

## 2017-10-07 MED ORDER — ROCURONIUM BROMIDE 50 MG/5ML IV SOLN
INTRAVENOUS | Status: AC
  Filled 2017-10-07: qty 1

## 2017-10-07 MED ORDER — MIDAZOLAM HCL 2 MG/2ML IJ SOLN
INTRAMUSCULAR | Status: DC | PRN
  Administered 2017-10-07: 3 mg via INTRAVENOUS
  Administered 2017-10-07: 2 mg via INTRAVENOUS

## 2017-10-07 MED ORDER — KETOROLAC TROMETHAMINE 30 MG/ML IJ SOLN
INTRAMUSCULAR | Status: DC | PRN
  Administered 2017-10-07: 30 mg via INTRAVENOUS

## 2017-10-07 SURGICAL SUPPLY — 34 items
APPLIER CLIP ROT 10 11.4 M/L (STAPLE) ×3
CHLORAPREP W/TINT 26ML (MISCELLANEOUS) ×3 IMPLANT
CLIP APPLIE ROT 10 11.4 M/L (STAPLE) ×1 IMPLANT
DECANTER SPIKE VIAL GLASS SM (MISCELLANEOUS) ×6 IMPLANT
DERMABOND ADVANCED (GAUZE/BANDAGES/DRESSINGS) ×2
DERMABOND ADVANCED .7 DNX12 (GAUZE/BANDAGES/DRESSINGS) ×1 IMPLANT
DRESSING SURGICEL FIBRLLR 1X2 (HEMOSTASIS) IMPLANT
DRSG SURGICEL FIBRILLAR 1X2 (HEMOSTASIS)
ELECT REM PT RETURN 9FT ADLT (ELECTROSURGICAL) ×3
ELECTRODE REM PT RTRN 9FT ADLT (ELECTROSURGICAL) ×1 IMPLANT
GLOVE BIO SURGEON STRL SZ7 (GLOVE) ×3 IMPLANT
GLOVE BIOGEL PI IND STRL 7.5 (GLOVE) ×1 IMPLANT
GLOVE BIOGEL PI INDICATOR 7.5 (GLOVE) ×2
GOWN STRL REUS W/ TWL LRG LVL3 (GOWN DISPOSABLE) ×3 IMPLANT
GOWN STRL REUS W/TWL LRG LVL3 (GOWN DISPOSABLE) ×6
GRASPER SUT TROCAR 14GX15 (MISCELLANEOUS) ×3 IMPLANT
IRRIGATION STRYKERFLOW (MISCELLANEOUS) ×1 IMPLANT
IRRIGATOR STRYKERFLOW (MISCELLANEOUS) ×3
IV NS 1000ML (IV SOLUTION) ×2
IV NS 1000ML BAXH (IV SOLUTION) ×1 IMPLANT
KIT TURNOVER KIT A (KITS) ×3 IMPLANT
NEEDLE HYPO 22GX1.5 SAFETY (NEEDLE) ×3 IMPLANT
NEEDLE INSUFFLATION 14GA 120MM (NEEDLE) ×3 IMPLANT
NS IRRIG 1000ML POUR BTL (IV SOLUTION) ×3 IMPLANT
PACK LAP CHOLECYSTECTOMY (MISCELLANEOUS) ×3 IMPLANT
POUCH SPECIMEN RETRIEVAL 10MM (ENDOMECHANICALS) ×3 IMPLANT
SCISSORS METZENBAUM CVD 33 (INSTRUMENTS) IMPLANT
SLEEVE ENDOPATH XCEL 5M (ENDOMECHANICALS) ×6 IMPLANT
SUT MNCRL AB 4-0 PS2 18 (SUTURE) ×3 IMPLANT
SUT VICRYL 0 UR6 27IN ABS (SUTURE) ×3 IMPLANT
SUT VICRYL AB 3-0 FS1 BRD 27IN (SUTURE) ×3 IMPLANT
TROCAR XCEL NON-BLD 11X100MML (ENDOMECHANICALS) ×3 IMPLANT
TROCAR XCEL NON-BLD 5MMX100MML (ENDOMECHANICALS) ×3 IMPLANT
TUBING INSUFFLATION (TUBING) ×3 IMPLANT

## 2017-10-07 NOTE — Interval H&P Note (Signed)
History and Physical Interval Note:  10/07/2017 7:23 AM  Melissa Mcmahon  has presented today for surgery, with the diagnosis of CHOLELITHIASIS  The various methods of treatment have been discussed with the patient and family. After consideration of risks, benefits and other options for treatment, the patient has consented to  Procedure(s): LAPAROSCOPIC CHOLECYSTECTOMY (N/A) as a surgical intervention .  The patient's history has been reviewed, patient examined, no change in status, stable for surgery.  I have reviewed the patient's chart and labs.  Questions were answered to the patient's satisfaction.     Ancil LinseyJason Evan Davis

## 2017-10-07 NOTE — Discharge Instructions (Addendum)
AMBULATORY SURGERY  DISCHARGE INSTRUCTIONS   1) The drugs that you were given will stay in your system until tomorrow so for the next 24 hours you should not:  A) Drive an automobile B) Make any legal decisions C) Drink any alcoholic beverage   2) You may resume regular meals tomorrow.  Today it is better to start with liquids and gradually work up to solid foods.  You may eat anything you prefer, but it is better to start with liquids, then soup and crackers, and gradually work up to solid foods.   3) Please notify your doctor immediately if you have any unusual bleeding, trouble breathing, redness and pain at the surgery site, drainage, fever, or pain not relieved by medication. 4)   5) Your post-operative visit with Dr.                                     is: Date:                        Time:    Please call to schedule your post-operative visit.  6) Additional Instructions:     In addition to included general post-operative instructions for Laparoscopic Cholecystectomy,  Diet: Resume home heart healthy diet (as discussed, start with low fat and advance).   Activity: No heavy lifting >20 pounds (children, pets, laundry, garbage) or strenuous activity until follow-up, but light activity and walking are encouraged. Do not drive or drink alcohol if taking narcotic pain medications.  Wound care: 2 days after surgery (Friday, 6/7), you may shower/get incision wet with soapy water and pat dry (do not rub incisions), but no baths or submerging incision underwater until follow-up.   Medications: Resume all home medications. For mild to moderate pain: acetaminophen (Tylenol) or ibuprofen/naproxen (if no kidney disease). Combining Tylenol with alcohol can substantially increase your risk of causing liver disease. Narcotic pain medications, if prescribed, can be used for severe pain, though may cause nausea, constipation, and drowsiness. Do not combine Tylenol and Percocet (or similar)  within a 6 hour period as Percocet (and similar) contain(s) Tylenol. If you do not need the narcotic pain medication, you do not need to fill the prescription.  Call office [phone]) at any time if any questions, worsening pain, fevers/chills, bleeding, drainage from incision site, or other concerns.   AMBULATORY SURGERY  DISCHARGE INSTRUCTIONS   7) The drugs that you were given will stay in your system until tomorrow so for the next 24 hours you should not:  D) Drive an automobile E) Make any legal decisions F) Drink any alcoholic beverage   8) You may resume regular meals tomorrow.  Today it is better to start with liquids and gradually work up to solid foods.  You may eat anything you prefer, but it is better to start with liquids, then soup and crackers, and gradually work up to solid foods.   9) Please notify your doctor immediately if you have any unusual bleeding, trouble breathing, redness and pain at the surgery site, drainage, fever, or pain not relieved by medication.    10) Additional Instructions:        Please contact your physician with any problems or Same Day Surgery at [phone], Monday through Friday 6 am to 4 pm, or [facility] Main number at [phone].

## 2017-10-07 NOTE — Op Note (Signed)
SURGICAL OPERATIVE REPORT   DATE OF PROCEDURE: 10/07/2017  ATTENDING Surgeon(s): Ancil Linseyavis, Karyss Frese Evan, MD  ASSISTANT(S): Jolyne LoaAli Tabibnejad, PA-S   ANESTHESIA: GETA  PRE-OPERATIVE DIAGNOSIS: Symptomatic Cholelithiasis (K80.20)  POST-OPERATIVE DIAGNOSIS: Symptomatic Cholelithiasis (K80.20)  PROCEDURE(S): (cpt's: 47562) 1.) Laparoscopic Cholecystectomy  INTRAOPERATIVE FINDINGS: Minimal pericholecystic inflammation with cystic duct and cystic artery clips well-secured, hemostasis at completion of procedure  INTRAOPERATIVE FLUIDS: 900 mL crystalloid   ESTIMATED BLOOD LOSS: Minimal (<30 mL)   URINE OUTPUT: No foley  SPECIMENS: Gallbladder  IMPLANTS: None  DRAINS: None   COMPLICATIONS: None apparent   CONDITION AT COMPLETION: Hemodynamically stable and extubated  DISPOSITION: PACU   INDICATION(S) FOR PROCEDURE:  Patient is a 27 y.o. female who recently presented with post-prandial RUQ > epigastric abdominal pain after eating fatty foods in particular. Ultrasound suggested cholelithiasis without sonographic evidence of cholecystitis. All risks, benefits, and alternatives to above elective procedures were discussed with the patient, who elected to proceed, and informed consent was accordingly obtained at that time.   DETAILS OF PROCEDURE:  Patient was brought to the operating suite and appropriately identified. General anesthesia was administered along with peri-operative prophylactic IV antibiotics, and endotracheal intubation was performed by anesthesiologist, along with NG/OG tube for gastric decompression. In supine position, operative site was prepped and draped in usual sterile fashion, and following a brief time out, initial 5 mm incision was made in a natural skin crease just above the umbilicus. Fascia was then elevated, and a Verress needle was inserted and its proper position confirmed using aspiration and saline meniscus test.  Upon insufflation of the abdominal cavity with  carbon dioxide to a well-tolerated pressure of 12-15 mmHg, 5 mm peri-umbilical port followed by laparoscope were inserted and used to inspect the abdominal cavity and its contents with no injuries from insertion of the first trochar noted. Three additional trocars were inserted, one at the epigastric position (10 mm) and two along the Right costal margin (5 mm). The table was then placed in reverse Trendelenburg position with the Right side up. Filmy adhesions between the gallbladder and omentum/duodenum/transverse colon were lysed using combined blunt dissection and selective electrocautery. The apex/dome of the gallbladder was grasped with an atraumatic grasper passed through the lateral port and retracted apically over the liver. The infundibulum was also grasped and retracted, exposing Calot's triangle. The peritoneum overlying the gallbladder infundibulum was incised and dissected free of surrounding peritoneal attachments, revealing the cystic duct and cystic artery, which were clipped twice on the patient side and once on the gallbladder specimen side close to the gallbladder. The gallbladder was then dissected from its peritoneal attachments to the liver using electrocautery, and the gallbladder was placed into a laparoscopic specimen bag and removed from the abdominal cavity via the epigastric port site. Hemostasis and secure placement of clips were confirmed, and intra-peritoneal cavity was inspected with no additional findings. PMI laparoscopic fascial closure device was then used to re-approximate fascia at the 10 mm epigastric port site.  All ports were then removed under direct visualization, and abdominal cavity was desuflated. All port sites were irrigated/cleaned, additional local anesthetic was injected at each incision, 3-0 Vicryl was used to re-approximate dermis at 10 mm port site(s), and subcuticular 4-0 Monocryl suture was used to re-approximate skin. Skin was then cleaned, dried, and  sterile skin glue was applied. Patient was then safely able to be awakened, extubated, and transferred to PACU for post-operative monitoring and care.   I was present for all aspects of the above procedure,  and no operative complications were apparent.

## 2017-10-07 NOTE — Anesthesia Procedure Notes (Signed)
Procedure Name: Intubation Date/Time: 10/07/2017 7:40 AM Performed by: Sherol DadeMacMang, Jamani Bearce H, CRNA Pre-anesthesia Checklist: Patient identified, Emergency Drugs available, Suction available, Patient being monitored and Timeout performed Patient Re-evaluated:Patient Re-evaluated prior to induction Oxygen Delivery Method: Circle system utilized Preoxygenation: Pre-oxygenation with 100% oxygen Induction Type: IV induction Ventilation: Mask ventilation without difficulty Laryngoscope Size: Miller and 2 Grade View: Grade I Tube type: Oral Tube size: 7.5 mm Number of attempts: 1 Airway Equipment and Method: Stylet Placement Confirmation: ETT inserted through vocal cords under direct vision,  positive ETCO2,  CO2 detector and breath sounds checked- equal and bilateral Secured at: 21 (@teeth ) cm Tube secured with: Tape Dental Injury: Teeth and Oropharynx as per pre-operative assessment

## 2017-10-07 NOTE — Anesthesia Postprocedure Evaluation (Signed)
Anesthesia Post Note  Patient: Melissa Mcmahon  Procedure(s) Performed: LAPAROSCOPIC CHOLECYSTECTOMY (N/A )  Patient location during evaluation: PACU Anesthesia Type: General Level of consciousness: awake and alert Pain management: pain level controlled Vital Signs Assessment: post-procedure vital signs reviewed and stable Respiratory status: spontaneous breathing and respiratory function stable Cardiovascular status: stable Anesthetic complications: no     Last Vitals:  Vitals:   10/07/17 0932 10/07/17 1002  BP: (!) 153/90 126/78  Pulse: (!) 53 (!) 55  Resp: 18 16  Temp: 36.5 C   SpO2: 100% 100%    Last Pain:  Vitals:   10/07/17 1002  TempSrc:   PainSc: 3                  Lashann Hagg K

## 2017-10-07 NOTE — Anesthesia Post-op Follow-up Note (Signed)
Anesthesia QCDR form completed.        

## 2017-10-07 NOTE — Transfer of Care (Signed)
Immediate Anesthesia Transfer of Care Note  Patient: Melissa Mcmahon  Procedure(s) Performed: LAPAROSCOPIC CHOLECYSTECTOMY (N/A )  Patient Location: PACU  Anesthesia Type:General  Level of Consciousness: drowsy and patient cooperative  Airway & Oxygen Therapy: Patient Spontanous Breathing and Patient connected to face mask oxygen  Post-op Assessment: Report given to RN, Post -op Vital signs reviewed and stable and Patient moving all extremities  Post vital signs: Reviewed and stable  Last Vitals:  Vitals Value Taken Time  BP 129/96 10/07/2017  8:55 AM  Temp 36.6 C 10/07/2017  8:55 AM  Pulse 59 10/07/2017  8:58 AM  Resp 20 10/07/2017  8:58 AM  SpO2 100 % 10/07/2017  8:58 AM  Vitals shown include unvalidated device data.  Last Pain:  Vitals:   10/07/17 0639  TempSrc: Tympanic  PainSc: 3          Complications: No apparent anesthesia complications

## 2017-10-07 NOTE — Anesthesia Preprocedure Evaluation (Signed)
Anesthesia Evaluation  Patient identified by MRN, date of birth, ID band Patient awake    Reviewed: Allergy & Precautions, NPO status , Patient's Chart, lab work & pertinent test results  History of Anesthesia Complications Negative for: history of anesthetic complications  Airway Mallampati: II       Dental   Pulmonary asthma (as a child, no inhalers in years) , neg sleep apnea, former smoker,           Cardiovascular (-) hypertension(-) Past MI and (-) CHF (-) dysrhythmias (-) Valvular Problems/Murmurs     Neuro/Psych neg Seizures    GI/Hepatic Neg liver ROS, GERD  Medicated,  Endo/Other  neg diabetes  Renal/GU negative Renal ROS     Musculoskeletal   Abdominal   Peds  Hematology   Anesthesia Other Findings   Reproductive/Obstetrics                             Anesthesia Physical Anesthesia Plan  ASA: II  Anesthesia Plan: General   Post-op Pain Management:    Induction: Intravenous  PONV Risk Score and Plan: 3 and Dexamethasone, Ondansetron and Midazolam  Airway Management Planned: Oral ETT  Additional Equipment:   Intra-op Plan:   Post-operative Plan:   Informed Consent: I have reviewed the patients History and Physical, chart, labs and discussed the procedure including the risks, benefits and alternatives for the proposed anesthesia with the patient or authorized representative who has indicated his/her understanding and acceptance.     Plan Discussed with:   Anesthesia Plan Comments:         Anesthesia Quick Evaluation

## 2017-10-08 LAB — SURGICAL PATHOLOGY

## 2017-10-13 ENCOUNTER — Ambulatory Visit: Payer: Self-pay | Admitting: Surgery

## 2017-10-26 ENCOUNTER — Ambulatory Visit (INDEPENDENT_AMBULATORY_CARE_PROVIDER_SITE_OTHER): Payer: Commercial Managed Care - PPO | Admitting: Surgery

## 2017-10-26 ENCOUNTER — Encounter: Payer: Self-pay | Admitting: Surgery

## 2017-10-26 VITALS — BP 149/88 | HR 84 | Temp 98.4°F | Wt 141.0 lb

## 2017-10-26 DIAGNOSIS — K8020 Calculus of gallbladder without cholecystitis without obstruction: Secondary | ICD-10-CM

## 2017-10-26 NOTE — Patient Instructions (Signed)
Please give us a call in case you have any questions or concerns.  GENERAL POST-OPERATIVE PATIENT INSTRUCTIONS   WOUND CARE INSTRUCTIONS:  Keep a dry clean dressing on the wound if there is drainage. The initial bandage may be removed after 24 hours.  Once the wound has quit draining you may leave it open to air.  If clothing rubs against the wound or causes irritation and the wound is not draining you may cover it with a dry dressing during the daytime.  Try to keep the wound dry and avoid ointments on the wound unless directed to do so.  If the wound becomes bright red and painful or starts to drain infected material that is not clear, please contact your physician immediately.  If the wound is mildly pink and has a thick firm ridge underneath it, this is normal, and is referred to as a healing ridge.  This will resolve over the next 4-6 weeks.  BATHING: You may shower if you have been informed of this by your surgeon. However, Please do not submerge in a tub, hot tub, or pool until incisions are completely sealed or have been told by your surgeon that you may do so.  DIET:  You may eat any foods that you can tolerate.  It is a good idea to eat a high fiber diet and take in plenty of fluids to prevent constipation.  If you do become constipated you may want to take a mild laxative or take ducolax tablets on a daily basis until your bowel habits are regular.  Constipation can be very uncomfortable, along with straining, after recent surgery.  ACTIVITY:  You are encouraged to cough and deep breath or use your incentive spirometer if you were given one, every 15-30 minutes when awake.  This will help prevent respiratory complications and low grade fevers post-operatively if you had a general anesthetic.  You may want to hug a pillow when coughing and sneezing to add additional support to the surgical area, if you had abdominal or chest surgery, which will decrease pain during these times.  You are  encouraged to walk and engage in light activity for the next two weeks.  You should not lift more than 20 pounds, until 11/18/2017 as it could put you at increased risk for complications.  Twenty pounds is roughly equivalent to a plastic bag of groceries. At that time- Listen to your body when lifting, if you have pain when lifting, stop and then try again in a few days. Soreness after doing exercises or activities of daily living is normal as you get back in to your normal routine.  MEDICATIONS:  Try to take narcotic medications and anti-inflammatory medications, such as tylenol, ibuprofen, naprosyn, etc., with food.  This will minimize stomach upset from the medication.  Should you develop nausea and vomiting from the pain medication, or develop a rash, please discontinue the medication and contact your physician.  You should not drive, make important decisions, or operate machinery when taking narcotic pain medication.  SUNBLOCK Use sun block to incision area over the next year if this area will be exposed to sun. This helps decrease scarring and will allow you avoid a permanent darkened area over your incision.  QUESTIONS:  Please feel free to call our office if you have any questions, and we will be glad to assist you. [phone]

## 2017-10-26 NOTE — Progress Notes (Signed)
Outpatient postop visit  10/26/2017  Clydene Fakeshley E Abdou is an 27 y.o. female.    Procedure: Laparoscopic cholecystectomy by Dr. Earlene Plateravis  CC: No problems  HPI: Patient states she is perfectly fine.  She feels well with no nausea or vomiting.  She is actually been back to work for a full week.  Medications reviewed.    Physical Exam:  BP (!) 149/88   Pulse 84   Temp 98.4 F (36.9 C) (Oral)   Wt 141 lb (64 kg)   LMP 10/04/2017 (Exact Date)   BMI 24.98 kg/m     PE: No icterus no jaundice abdomen soft nontender wounds are clean no erythema no drainage    Assessment/Plan:  Pathology is reviewed.  Patient doing very well recommend follow-up on an as-needed basis  Lattie Hawichard E Chairty Toman, MD, FACS

## 2017-11-27 ENCOUNTER — Encounter: Payer: Self-pay | Admitting: Certified Nurse Midwife

## 2017-11-27 ENCOUNTER — Ambulatory Visit (INDEPENDENT_AMBULATORY_CARE_PROVIDER_SITE_OTHER): Payer: Commercial Managed Care - PPO | Admitting: Certified Nurse Midwife

## 2017-11-27 VITALS — BP 115/80 | HR 71 | Ht 63.0 in | Wt 139.4 lb

## 2017-11-27 DIAGNOSIS — R102 Pelvic and perineal pain: Secondary | ICD-10-CM

## 2017-11-27 DIAGNOSIS — R42 Dizziness and giddiness: Secondary | ICD-10-CM | POA: Diagnosis not present

## 2017-11-27 DIAGNOSIS — R5383 Other fatigue: Secondary | ICD-10-CM

## 2017-11-27 DIAGNOSIS — Z842 Family history of other diseases of the genitourinary system: Secondary | ICD-10-CM

## 2017-11-27 DIAGNOSIS — N921 Excessive and frequent menstruation with irregular cycle: Secondary | ICD-10-CM | POA: Diagnosis not present

## 2017-11-27 DIAGNOSIS — R232 Flushing: Secondary | ICD-10-CM

## 2017-11-27 NOTE — Patient Instructions (Addendum)
Pelvic Pain, Female Pelvic pain is pain in your lower belly (abdomen), below your belly button and between your hips. The pain may start suddenly (acute), keep coming back (recurring), or last a long time (chronic). Pelvic pain that lasts longer than six months is considered chronic. There are many causes of pelvic pain. Sometimes the cause of your pelvic pain is not known. Follow these instructions at home:  Take over-the-counter and prescription medicines only as told by your doctor.  Rest as told by your doctor.  Do not have sex it if hurts.  Keep a journal of your pelvic pain. Write down: ? When the pain started. ? Where the pain is located. ? What seems to make the pain better or worse, such as food or your menstrual cycle. ? Any symptoms you have along with the pain.  Keep all follow-up visits as told by your doctor. This is important. Contact a doctor if:  Medicine does not help your pain.  Your pain comes back.  You have new symptoms.  You have unusual vaginal discharge or bleeding.  You have a fever or chills.  You are having a hard time pooping (constipation).  You have blood in your pee (urine) or poop (stool).  Your pee smells bad.  You feel weak or lightheaded. Get help right away if:  You have sudden pain that is very bad.  Your pain continues to get worse.  You have very bad pain and also have any of the following symptoms: ? A fever. ? Feeling stick to your stomach (nausea). ? Throwing up (vomiting). ? Being very sweaty.  You pass out (lose consciousness). This information is not intended to replace advice given to you by your health care provider. Make sure you discuss any questions you have with your health care provider. Document Released: 10/08/2007 Document Revised: 05/16/2015 Document Reviewed: 02/09/2015 Elsevier Interactive Patient Education  2018 [employer] Tranexamic acid oral tablets What is this medicine? TRANEXAMIC ACID (TRAN ex  AM ik AS id) slows down or stops blood clots from being broken down. This medicine is used to treat heavy monthly menstrual bleeding. This medicine may be used for other purposes; ask your health care provider or pharmacist if you have questions. COMMON BRAND NAME(S): Cyklokapron, Lysteda What should I tell my health care provider before I take this medicine? They need to know if you have any of these conditions: -bleeding in the brain -blood clotting problems -kidney disease -vision problems -an unusual allergic reaction to tranexamic acid, other medicines, foods, dyes, or preservatives -pregnant or trying to get pregnant -breast-feeding How should I use this medicine? Take this medicine by mouth with a glass of water. Follow the directions on the prescription label. Do not cut, crush, or chew this medicine. You can take it with or without food. If it upsets your stomach, take it with food. Take your medicine at regular intervals. Do not take it more often than directed. Do not stop taking except on your doctor's advice. Do not take this medicine until your period has started. Do not take it for more than 5 days in a row. Do not take this medicine when you do not have your period. Talk to your pediatrician regarding the use of this medicine in children. While this drug may be prescribed for female children as young as 29 years of age for selected conditions, precautions do apply. Overdosage: If you think you have taken too much of this medicine contact a poison control center  or emergency room at once. NOTE: This medicine is only for you. Do not share this medicine with others. What if I miss a dose? If you miss a dose, take it when you remember, and then take your next dose at least 6 hours later. Do not take more than 2 tablets at a time to make up for missed doses. What may interact with this medicine? Do not take this medicine with any of the following medications: -estrogens -birth  control pills, patches, injections, rings or other devices that contain both an estrogen and a progestin This medicine may also interact with the following medications: -certain medicines used to help your blood clot -tretinoin (taken by mouth) This list may not describe all possible interactions. Give your health care provider a list of all the medicines, herbs, non-prescription drugs, or dietary supplements you use. Also tell them if you smoke, drink alcohol, or use illegal drugs. Some items may interact with your medicine. What should I watch for while using this medicine? Tell your doctor or healthcare professional if your symptoms do not start to get better or if they get worse. Tell your doctor or healthcare professional if you notice any eye problems while taking this medicine. Your doctor will refer you to an eye doctor who will examine your eyes. What side effects may I notice from receiving this medicine? Side effects that you should report to your doctor or health care professional as soon as possible: -allergic reactions like skin rash, itching or hives, swelling of the face, lips, or tongue -breathing difficulties -changes in vision -sudden or severe pain in the chest, legs, head, or groin -unusually weak or tired Side effects that usually do not require medical attention (report to your doctor or health care professional if they continue or are bothersome): -back pain -headache -muscle or joint aches -sinus and nasal problems -stomach pain -tiredness This list may not describe all possible side effects. Call your doctor for medical advice about side effects. You may report side effects to FDA at 1-800-FDA-1088. Where should I keep my medicine? Keep out of the reach of children. Store at room temperature between 15 and 30 degrees C (59 and 86 degrees F). Throw away any unused medicine after the expiration date. NOTE: This sheet is a summary. It may not cover all possible  information. If you have questions about this medicine, talk to your doctor, pharmacist, or health care provider.  2018 Elsevier/Gold Standard (2015-05-24 09:12:15)

## 2017-11-27 NOTE — Progress Notes (Signed)
[facility].Is having severe abdominal pain and bleeding with periods.

## 2017-11-28 LAB — ESTRADIOL: Estradiol: 39.2 pg/mL

## 2017-11-28 LAB — FSH/LH
FSH: 8.3 m[IU]/mL
LH: 8.8 m[IU]/mL

## 2017-11-28 LAB — PROGESTERONE: Progesterone: 0.3 ng/mL

## 2017-12-01 ENCOUNTER — Encounter: Payer: Commercial Managed Care - PPO | Admitting: Obstetrics and Gynecology

## 2017-12-04 ENCOUNTER — Ambulatory Visit (INDEPENDENT_AMBULATORY_CARE_PROVIDER_SITE_OTHER): Payer: Commercial Managed Care - PPO | Admitting: Obstetrics and Gynecology

## 2017-12-04 ENCOUNTER — Encounter: Payer: Self-pay | Admitting: Obstetrics and Gynecology

## 2017-12-04 VITALS — BP 117/77 | HR 83 | Wt 144.1 lb

## 2017-12-04 DIAGNOSIS — N921 Excessive and frequent menstruation with irregular cycle: Secondary | ICD-10-CM

## 2017-12-04 DIAGNOSIS — R102 Pelvic and perineal pain: Secondary | ICD-10-CM | POA: Diagnosis not present

## 2017-12-04 DIAGNOSIS — R232 Flushing: Secondary | ICD-10-CM

## 2017-12-04 DIAGNOSIS — Z9851 Tubal ligation status: Secondary | ICD-10-CM

## 2017-12-04 DIAGNOSIS — R5383 Other fatigue: Secondary | ICD-10-CM

## 2017-12-04 DIAGNOSIS — R42 Dizziness and giddiness: Secondary | ICD-10-CM

## 2017-12-04 DIAGNOSIS — Z842 Family history of other diseases of the genitourinary system: Secondary | ICD-10-CM

## 2017-12-04 MED ORDER — DANAZOL 200 MG PO CAPS: 200.0000 mg | ORAL_CAPSULE | Freq: Two times a day (BID) | ORAL | 6 refills | Status: DC

## 2017-12-04 NOTE — Patient Instructions (Signed)
Danazol  Danazol has been used to treat women with endometriosis since the 1970s [1]. It was the most commonly used drug in the early 1980s, but its use declined markedly after the introduction of the GnRH agonists in the late 1980s and early 1990s. Danazol is a synthetic androgen [1]. Androgens are hormones produced by the female testes. They are responsible for the functioning of the female reproductive system and the development of the female characteristics, such as facial hair and a deep voice. The ovaries also produce small amounts of androgens.  Danazol is an effective treatment for endometriosis, and has the same effectiveness as the other hormonal treatments. However, it has many androgenic (female-like) side effects, including weight gain, increased body hair and acne. Its unpleasant side effects and its tendency to adversely affect blood lipid (cholesterol) levels mean it is not usually the first choice of treatment for endometriosis [1].  How it works Like all the other hormonal treatments, danazol does not cure endometriosis permanently. Rather, it suppresses its growth and development temporarily, so the disease may recur following treatment.  Danazol has a multitude of effects on the body. Some of these effects combine to produce high levels of androgen and low levels of oestrogen in the body. This hormonal environment stops menstruation and suppresses the growth of endometrial implants, causing them to degenerate [1,2].  Most women will stop ovulating and menstruating by the second month of treatment, though this may depend on the dosage used. The symptoms of endometriosis usually begin to diminish by the end of the second month.  Most women will resume ovulating and menstruating within 4-6 weeks of stopping treatment [2].  Dosage The usual length of treatment is 3-6 months, but it may be extended to 9 months in some circumstances.  The recommended dosages vary. [employer]  gynaecologists tend to recommend dosages of 800 milligrams per day, whereas [place] gynaecologists tend to recommend dosages of 600 milligrams per day [3].  Some gynaecologists believe it is better to base the dosage on the minimum needed to stop menstruation. In this case, they may suggest that you start with 400 milligrams per day, and, if necessary, increase the dose until your periods stop [3]. Alternatively, they may suggest that you start with 600 milligrams a day, and reduce the dosage to 400 milligrams a day or even 200 milligrams a day [2].  You should start your course of danazol on the first day of your period to decrease the risk of taking the drug while you are pregnant. If there is any possibility that you may be pregnant, you should have a pregnancy test before starting treatment [4].  Although it is unlikely that you will conceive while on danazol, care should be taken to avoid pregnancy. It is recommended that you use barrier contraception (condom or diaphragm or both) throughout treatment [4].  You should not take danazol if you have [5]:  liver disease high blood pressure heart failure poor kidney function Generic name Brand name Form Dosage Azol   Danazol Cyclomen Gelatin capsules 400-800 mg a day Danocrine   Danol   Side effects Danazol can cause many side effects, but there are marked variations in the ways women respond to it. Nevertheless, most women will experience many side effects [2], and many will stop taking it as a result of these [6].  The number and severity of side effects experienced is sometimes related to the dosage being used. Reducing your dosage to the minimum needed to  stop your periods may reduce the side effects experienced.  Many of the side effects are due to the fact that it is an androgen[2,4,5]. These include:  weight gain* acne oily skin and hair change of voice (deepening, husky or petering out at times) bloating fluid  retention voice changes increase in body hair decreased breast size decreased libido enlargement of the clitoris (rare). *Weight gain is a common side effect. Most women experience weight gain, usually 1-5 kilograms but occasionally more [2]. When treatment finishes most women lose much of the weight gain within 1-2 months.  Some of the side effects are due to the low levels of oestrogen in the body[4,5]. These include:  hot flushes night sweats vaginal dryness Danazol can also cause other side effects[2,4,5], including:  irregular vaginal bleeding or spotting skin rash nausea headaches muscle cramps tingling of the limbs emotional instability fatigue, adverse effects on blood lipid (cholesterol) levels decreased glucose tolerance. Most of the side effects disappear soon after completing treatment. However, some of the androgenic side effects are sometimes irreversible [4,5], such as:  deepening of the voice, increased body hair (especially if profuse) enlargement of the clitoris. If you develop any of these side effects, notify your gynaecologist immediately.  Long-term use is associated with a small risk of developing liver tumours and a theoretical risk of developing heart disease [2]. If your treatment is lasts longer than 6 months, your liver function should be monitored [2].   Tips for coping with the side effects of drug treatments  Effectiveness for pain symptoms Clinical trials have shown that danazol is as effective as the other hormonal treatments in relieving the pain symptoms of endometriosis [5,6]. It relieves pain in approximately 90% of women [7]. However, it does not always relieve symptoms completely [6].  Symptoms often recur following hormonal treatment [5]. The recurrence of symptoms may occur months or years after treatment ceases. One unpublished study found that 60% of women had had a recurrence of their symptoms within five years of treatment  [8].  Use before surgery There is no evidence to justify using a course of hormonal treatment as a preparation for surgery [5].  Use after surgery There is some evidence to justify using hormonal treatment following surgery to suppress the growth and development of any remaining or new endometrial implants [5].  Use in recurrent endometriosis If the drug was effective and well tolerated previously, repeat courses of danazol may be used for women with recurrent endometriosis.  Effectiveness for infertility Danazol - like all the hormonal treatments for endometriosis - will not improve your chance of conceiving, so it should not be used as a treatment for infertility [9].  Keeping track You should visit your gynaecologist about six to eight weeks after beginning treatment with danazol to discuss how the treatment is progressing. Contact your gynaecologist if you have any problems between scheduled visits.  Pregnancy and breastfeeding Danazol should not be used during pregnancy as it can cause masculinisation (development of female-like features) of a female foetus [4]. If you suspect that you may be pregnant while taking danazol, you should stop taking the drug and contact your gynaecologist immediately.  It is not known if danazol is excreted in the breastmilk nor whether it has harmful effects on the infant. Therefore, you should not take danazol while breastfeeding [10].  Interactions There are no known interactions of danazol with any foods or alcohol. However, it does interact with some medicines, so make sure your gynaecologist is aware of  any other medicines you are taking [4].  References Selak Vicente Masson, Singla A. Danazol for pelvic pain associated with endometriosis. The Cochrane Database of Systematic Reviews 2001, Issue 4. Art. No.: F9803860. DOI: 10.1002/14651858.ZO109604. Terri Skains. The patient's essential guide to endometriosis. [place] Neither,  2003. Barnabas Lister DL. Endometriosis: Medical therapy. [facility] Clin Obstet [phone]. [place] Medicines Handbook [employer]. [place] Medicines Handbook 2004. [place], [place] Medicines [employer], 2004. Terri Skains, Bergqvist A, Chapron C, D'Hooghe T, Dunselman G, Greb R, Hummelshoj L, Prentice A, Saridogan E. ESHRE guideline for the diagnosis and management of endometriosis. Human Reprod 2005;20(10):2698-2704. [employer] of Obstetricians and Press photographer. Clinical green-top guidelines: the investigation and management of endometriosis. RCOG, 2000. Biberoglu KO, Behrman SJ. Dosage aspects of danazol therapy in endometriosis: short-term and long-term effectiveness. Am J Obstet Gynecol 1981;139(6):645-54. Clelia Croft, R. Moderator's lecture: Medical therapy. 9th World Endometriosis Congress, Mariano Colan, 2005. Raynald Kemp, Fedorkow D, Collins J, Vandekerckhove P. Ovulation suppression for endometriosis. The Cochrane Database of Systematic Reviews 2003, Issue 3. Art. No.: Z3312421. [employer]. Azol Consumer Medicines Information. [place], [address], 2005. Thank you to the following for reviewing this article prior to its publication: Aura Camps, Clinical Reader/Honorary Consultant and Head of Department, [employer], [place] Mette Randol Kern, Associate Professor and Hydrographic surveyor, [facility], [place]

## 2017-12-04 NOTE — Progress Notes (Signed)
GYNECOLOGY PROGRESS NOTE  Subjective:    Patient ID: Melissa Mcmahon, female    DOB: 03-Aug-1990, 27 y.o.   MRN: [id number]  HPI  Patient is a 27 y.o. G28P2002 female who presents as a referral from midwifery care Serafina Royals, CNM) for discussion of options for her current complaints of pelvic pain, menorrhagia with irregular cycle, hot flushes, dizziness, and fatigue.  She additionally complains of generalized shoulder and body pain increases right before menses and resolves after menses has completed.  She does have a family history of endometriosis.  She was recently concerned about the possibility of early onset menopause due to some of her symptoms.  Patient states she is thinking of surgical management, perhaps with an endometrial ablation.  Patient notes that she has been dealing with her current symptoms for at least 3 years.  Of note patient does have a history of diagnostic laparoscopy with biopsies in 2016 for assessment of endometriosis (performed by [facility]), with no significant findings.  She has tried the [employer], Depo-Provera, and oral contraceptive pills without any significant improvement in her symptoms.  Patient also thinks that she may have tried Lupron in the past, however had to discontinue (although she cannot recall the reason why).  The following portions of the patient's history were reviewed and updated as appropriate:  She  has a past medical history of Acid reflux, Asthma and Headache.  She  has a past surgical history that includes FEET SURGERY; Tubal ligation; Knee arthroscopy; laparoscopy (N/A, 02/08/2015); and Cholecystectomy (N/A, 10/07/2017).   Her family history includes Diabetes in her father and maternal aunt.   She  reports that she quit smoking about 5 months ago. Her smoking use included e-cigarettes and cigarettes. She has a 7.00 pack-year smoking history. She has never used smokeless tobacco. She reports that she does not drink alcohol or  use drugs.   Current Outpatient Medications on File Prior to Visit  Medication Sig Dispense Refill  . omeprazole (PRILOSEC) 20 MG capsule Take 20 mg by mouth every morning.      No current facility-administered medications on file prior to visit.    She is allergic to penicillins..  Review of Systems Pertinent items noted in HPI and remainder of comprehensive ROS otherwise negative.   Objective:   Blood pressure 117/77, pulse 83, weight 144 lb 1.6 oz (65.4 kg), last menstrual period 11/24/2017. General appearance: alert and no distress Remainder of exam deferred.   Labs:  Results for orders placed or performed in visit on 11/27/17  FSH/LH  Result Value Ref Range   LH 8.8 mIU/mL   FSH 8.3 mIU/mL  Estradiol  Result Value Ref Range   Estradiol 39.2 pg/mL  Progesterone  Result Value Ref Range   Progesterone 0.3 ng/mL    Normal TSH and CBC performed by [facility] several days ago.  Assessment:   Pelvic pain Menorrhagia with irregular cycle Family history of endometriosis Fatigue Hot flashes Dizziness History of tubal ligation  Plan:   -Lengthy discussion had with patient that although her symptoms do resemble those of endometriosis, she has had a negative laparoscopy approximately 3 years ago.  Discussed that at that time the there may have been some meat lesions that could have been microscopic, however after 3 years may be more visible today.  Discussed option of performing another laparoscopy to reassess for endometriosis for diagnostic purposes.  Also discussed with patient different management options for her symptoms.  She has tried Depo-Provera,  Nexplanon, and OCPs in the past.  She has declined an IUD as a treatment option.  I offered her the options of the NuvaRing, danazol, Orlissa, or Aygestin as medical therapy, I also offered referral to pelvic floor physical therapy for non-traditional options.  In addition I discussed surgical options with patient  patient currently desiring an endometrial ablation, however I discussed with her that even if she had an ablation it may stop her menses however it may not treat her pelvic pain and might actually worsen her pain due to her history of having a tubal ligation I also discussed with her that the length of duration for an endometrial ablation treatment effects are usually approximately 7 to 10 years.  I also discussed the option of hysterectomy with the patient, which patient has also considered.  She is just concerned that if she does have a surgery what will happen if continues to have symptoms.  All questions answered with patient and family.  After lengthy discussion patient has now decided to try danazol will start on 200 mg twice daily, and titrate up if necessary. -Discussion had on patient's lab results, no evidence of premature ovarian failure or evidence of early menopausal state noted.  Gust that her symptoms may also be related to her hormones, and could potentially be regulated by hormonal therapy. -Patient to follow-up in 6 to 8 weeks to reassess symptoms.    A total of 25 minutes were spent face-to-face with the patient during this encounter and over half of that time involved counseling and coordination of care.   Hildred Laserherry, Krystol Rocco, MD [facility]

## 2017-12-04 NOTE — Progress Notes (Signed)
Pt stated that she is thinking about having surgery to relieve the pain. No other complaints.

## 2017-12-04 NOTE — Progress Notes (Signed)
GYN ENCOUNTER NOTE  Subjective:       Melissa Mcmahon is a 27 y.o. G35P2002 female here for gynecologic evaluation of the following issues:  1. Pelvic pain 2. Menorrhagia with irregular cycle 3. Family history of endometriosis 4. Fatigue 5. Dizziness 6. Hot flashes  Reports generalized shoulder and body pain that increases with menses as well as intermittent fatigue, dizziness with syncopal episode, and hot flashes for the last few years.   No relief with trial of OCPs, Nexplanon, or Depo. Declines IUD placement. Questions possibility of hysterectomy.   Patient was seen yesterday at [facility] and questions possibility of early onset menopause. States labs were collected yesterday in office.   Last seen by myself on 08/10/2017 for similar symptoms and management options discussed. Patient agreed to follow up with Dr. Valentino Saxon at that time, but was unable to due to change in insurance status.   Previously seen by Dr. Valentino Saxon on 07/17/2014, see chart in old system, for ER follow up due to abdominal pain, bloating, and right sided pelvic pain. Mostly recently, she was a patient of Dr. Bonney Aid at [facility]. Reports negative diagnostic lap in 02/2015.  Denies difficulty breathing or respiratory distress, chest pain, dysuria, and leg pain or swelling.   Family history significant for endometriosis. She is accompanied by her mother to today's appointment.    Gynecologic History  Patient's last menstrual period was 11/25/2017 (exact date).    Contraception: tubal ligation  Last Pap: 01/04/2014. Results were: normal  Obstetric History  OB History  Gravida Para Term Preterm AB Living  2 2 2     2   SAB TAB Ectopic Multiple Live Births          2    # Outcome Date GA Lbr Len/2nd Weight Sex Delivery Anes PTL Lv  2 Term 2014    F Vag-Spont   LIV  1 Term 2012    F Vag-Spont   LIV    Past Medical History:  Diagnosis Date  . Acid reflux   . Asthma    H/O AS A CHILD-NO INHALERS   .  Endometriosis   . Headache    MIGRAINES-RARE    Past Surgical History:  Procedure Laterality Date  . CHOLECYSTECTOMY N/A 10/07/2017   Procedure: LAPAROSCOPIC CHOLECYSTECTOMY;  Surgeon: Ancil Linsey, MD;  Location: [facility] ORS;  Service: General;  Laterality: N/A;  . FEET SURGERY    . KNEE ARTHROSCOPY    . LAPAROSCOPY N/A 02/08/2015   Procedure: LAPAROSCOPY DIAGNOSTIC;  Surgeon: Vena Austria, MD;  Location: [facility] ORS;  Service: Gynecology;  Laterality: N/A;  . TUBAL LIGATION      Current Outpatient Medications on File Prior to Visit  Medication Sig Dispense Refill  . omeprazole (PRILOSEC) 20 MG capsule Take 20 mg by mouth every morning.      No current facility-administered medications on file prior to visit.     Allergies  Allergen Reactions  . Penicillins Hives    Has patient had a PCN reaction causing immediate rash, facial/tongue/throat swelling, SOB or lightheadedness with hypotension: unkn Has patient had a PCN reaction causing severe rash involving mucus membranes or skin necrosis: no Has patient had a PCN reaction that required hospitalization: no Has patient had a PCN reaction occurring within the last 10 years: no If all of the above answers are "NO", then may proceed with Cephalosporin use.     Social History   Socioeconomic History  . Marital status: Married    Spouse  name: Not on file  . Number of children: Not on file  . Years of education: Not on file  . Highest education level: Not on file  Occupational History  . Not on file  Social Needs  . Financial resource strain: Not on file  . Food insecurity:    Worry: Not on file    Inability: Not on file  . Transportation needs:    Medical: Not on file    Non-medical: Not on file  Tobacco Use  . Smoking status: Former Smoker    Packs/day: 1.00    Years: 7.00    Pack years: 7.00    Types: E-cigarettes, Cigarettes    Last attempt to quit: 06/07/2017    Years since quitting: 0.4  . Smokeless tobacco:  Never Used  Substance and Sexual Activity  . Alcohol use: No  . Drug use: No  . Sexual activity: Yes    Birth control/protection: Surgical  Lifestyle  . Physical activity:    Days per week: Not on file    Minutes per session: Not on file  . Stress: Not on file  Relationships  . Social connections:    Talks on phone: Not on file    Gets together: Not on file    Attends religious service: Not on file    Active member of club or organization: Not on file    Attends meetings of clubs or organizations: Not on file    Relationship status: Not on file  . Intimate partner violence:    Fear of current or ex partner: Not on file    Emotionally abused: Not on file    Physically abused: Not on file    Forced sexual activity: Not on file  Other Topics Concern  . Not on file  Social History Narrative  . Not on file    Family History  Problem Relation Age of Onset  . Diabetes Father   . Diabetes Maternal Aunt   . Breast cancer Neg Hx   . Ovarian cancer Neg Hx   . Colon cancer Neg Hx     The following portions of the patient's history were reviewed and updated as appropriate: allergies, current medications, past family history, past medical history, past social history, past surgical history and problem list.  Review of Systems  Review of Systems - Negative except as noted above.  History obtained from the patient  Objective:   BP 115/80   Pulse 71   Ht 5\' 3"  (1.6 m)   Wt 139 lb 6 oz (63.2 kg)   LMP 11/25/2017 (Exact Date)   BMI 24.69 kg/m    CONSTITUTIONAL: Well-developed, well-nourished female in no acute distress.   Physical exam: Deferred  Assessment:   1. Pelvic pain   2. Menorrhagia with irregular cycle - CBC - FSH/LH - Estradiol - Ferritin - Thyroid Panel With TSH  3. Family history of endometriosis  4. Fatigue, unspecified type - CBC - Ferritin - Thyroid Panel With TSH  5. Dizziness - CBC - Ferritin - Thyroid Panel With TSH  6. Hot  flashes - CBC - Ferritin - Thyroid Panel With TSH   Plan:   Chart reviewed from [facility]. Only TSH and CBC collected.   Labs: Menopause labs, see orders. Will contact patient via MyChart with results.   Discussed treatment options including hormonal IUD, NSAIDs, Lysteda, and endometrial ablation. Handouts given. Reiterated that all surgical options must be discussed with MD.   Desires ablation, if possible. Understands that  ablation might increase cramping with menses and may not treatment pelvic pain.   Reviewed red flag symptoms and when to call.   RTC x 1-2 weeks for follow up visit with Dr. Valentino Saxonherry.    Melissa Mcmahon, CNM [facility], [facility]

## 2017-12-30 ENCOUNTER — Encounter: Payer: Self-pay | Admitting: Obstetrics and Gynecology

## 2017-12-30 ENCOUNTER — Ambulatory Visit (INDEPENDENT_AMBULATORY_CARE_PROVIDER_SITE_OTHER): Payer: Commercial Managed Care - PPO | Admitting: Obstetrics and Gynecology

## 2017-12-30 ENCOUNTER — Other Ambulatory Visit (INDEPENDENT_AMBULATORY_CARE_PROVIDER_SITE_OTHER): Payer: Commercial Managed Care - PPO

## 2017-12-30 VITALS — BP 126/79 | HR 80 | Ht 63.0 in | Wt 142.8 lb

## 2017-12-30 DIAGNOSIS — R102 Pelvic and perineal pain: Secondary | ICD-10-CM

## 2017-12-30 DIAGNOSIS — N8312 Corpus luteum cyst of left ovary: Secondary | ICD-10-CM | POA: Diagnosis not present

## 2017-12-30 NOTE — Progress Notes (Signed)
Pt stated that she is having pelvic pain x 2 weeks. Pt also stated being dizzy, lightheaded, and pt stated that she vomited today.

## 2017-12-30 NOTE — Progress Notes (Signed)
    GYNECOLOGY PROGRESS NOTE  Subjective:    Patient ID: Melissa Mcmahon, female    DOB: 03/20/1991, 27 y.o.   MRN: [id number]  HPI  Patient is a 27 y.o. 382P2002 female who presents for complaints of pelvic pain x 2 weeks.  She notes that it feels like something is twisting in knots in her stomach. States that it usually only lasts for several minutes, however today has lasted for several hours.  She also notes that she is feeling lightheaded with nausea and vomiting today.  Denies urinary symptoms, bowel disturbances. Of note, patient has a history of chronic intermittent pelvic pain.   The following portions of the patient's history were reviewed and updated as appropriate: allergies, current medications, past family history, past medical history, past social history, past surgical history and problem list.  Review of Systems Pertinent items noted in HPI and remainder of comprehensive ROS otherwise negative.   Objective:   Blood pressure 126/79, pulse 80, height 5\' 3"  (1.6 m), weight 142 lb 12.8 oz (64.8 kg), last menstrual period 12/05/2017. General appearance: alert and no distress Abdomen: normal findings: bowel sounds normal, no masses palpable, umbilicus normal and soft and abnormal findings:  mild tenderness in the lower abdomen (mostly in midline) Pelvic: exam deferred.    Assessment:   Pelvic pain  Plan:   No obvious source of pelvic pain.  Will order pelvic ultrasound to rule out ovarian cyst, or other causes.   Given patient Ibuprofen in office to help with pain as patient has not taken anything today.  Notes that she desires to go try to eat something for pain, and then will return later this morning for scheduled ultrasound.   Unable to void currently.    Hildred Laserherry, Shamonica Schadt, MD [facility]

## 2017-12-31 ENCOUNTER — Encounter: Payer: Self-pay | Admitting: Obstetrics and Gynecology

## 2018-01-11 ENCOUNTER — Ambulatory Visit: Payer: Commercial Managed Care - PPO | Admitting: Internal Medicine

## 2018-01-11 ENCOUNTER — Other Ambulatory Visit: Payer: Self-pay

## 2018-01-11 ENCOUNTER — Telehealth: Payer: Self-pay | Admitting: Obstetrics and Gynecology

## 2018-01-11 DIAGNOSIS — Z0289 Encounter for other administrative examinations: Secondary | ICD-10-CM

## 2018-01-11 NOTE — Telephone Encounter (Signed)
Pt called no answer LM via voicemail to call the office.  

## 2018-01-11 NOTE — Telephone Encounter (Signed)
Pt called no answer LM via voicemail to call the office to speak more about her concerns for calling the office this morning.  

## 2018-01-11 NOTE — Telephone Encounter (Signed)
The patient called and stated that she needs to speak with a nurse in regards to her not hearing anything back from her u/s she had two weeks ago. The patient also stated that she is experiencing pain in her shoulders and legs. The patient would like a call back Please advise.

## 2018-01-11 NOTE — Telephone Encounter (Signed)
Pt was called and speak with her again concerning her test results a message was sent to [facility].

## 2018-01-11 NOTE — Telephone Encounter (Signed)
Please see another phone call agenda.

## 2018-01-11 NOTE — Telephone Encounter (Signed)
The patient called and stated that she has been waiting a call back from a nurse in regards to results from an appointment that was "over a week ago". Please advise.

## 2018-01-11 NOTE — Telephone Encounter (Signed)
Pt called back and was informed as soon as [facility] reviewed her test results she would receive a call back with her test results.

## 2018-01-12 NOTE — Telephone Encounter (Signed)
Number was busy no answer and was unable to LM.

## 2018-01-12 NOTE — Telephone Encounter (Signed)
Spoke with pt and informed her that her ultrasound was normal but due to her pain AC advised that she come in for a reassessment. Pt was put on hold for less that 2 minutes to help with an appt for next week and the pt hung up.

## 2018-01-12 NOTE — Telephone Encounter (Signed)
No answer LM via voicemail to call the office for test results.

## 2018-01-12 NOTE — Telephone Encounter (Signed)
Pt called no answer LM via voicemail to call the office for her test results.

## 2018-01-23 ENCOUNTER — Encounter: Payer: Self-pay | Admitting: Emergency Medicine

## 2018-01-23 ENCOUNTER — Emergency Department
Admission: EM | Admit: 2018-01-23 | Discharge: 2018-01-23 | Disposition: A | Discharge status: Home / Self Care | Payer: Commercial Managed Care - PPO | Attending: Emergency Medicine | Admitting: Emergency Medicine

## 2018-01-23 ENCOUNTER — Other Ambulatory Visit: Payer: Self-pay

## 2018-01-23 ENCOUNTER — Emergency Department: Payer: Commercial Managed Care - PPO

## 2018-01-23 DIAGNOSIS — R102 Pelvic and perineal pain: Secondary | ICD-10-CM | POA: Insufficient documentation

## 2018-01-23 DIAGNOSIS — J45909 Unspecified asthma, uncomplicated: Secondary | ICD-10-CM | POA: Diagnosis not present

## 2018-01-23 DIAGNOSIS — R109 Unspecified abdominal pain: Secondary | ICD-10-CM | POA: Diagnosis present

## 2018-01-23 DIAGNOSIS — Z79899 Other long term (current) drug therapy: Secondary | ICD-10-CM | POA: Diagnosis not present

## 2018-01-23 DIAGNOSIS — Z87891 Personal history of nicotine dependence: Secondary | ICD-10-CM | POA: Diagnosis not present

## 2018-01-23 LAB — COMPREHENSIVE METABOLIC PANEL
ALT: 22 U/L (ref 0–44)
AST: 20 U/L (ref 15–41)
Albumin: 4.1 g/dL (ref 3.5–5.0)
Alkaline Phosphatase: 55 U/L (ref 38–126)
Anion gap: 5 (ref 5–15)
BUN: 15 mg/dL (ref 6–20)
CO2: 30 mmol/L (ref 22–32)
Calcium: 8.9 mg/dL (ref 8.9–10.3)
Chloride: 106 mmol/L (ref 98–111)
Creatinine, Ser: 0.63 mg/dL (ref 0.44–1.00)
GFR calc Af Amer: >60 mL/min (ref >60)
Glucose, Bld: 120 mg/dL — ABNORMAL HIGH (ref 70–99)
POTASSIUM: 4 mmol/L (ref 3.5–5.1)
Sodium: 141 mmol/L (ref 135–145)
TOTAL PROTEIN: 6.5 g/dL (ref 6.5–8.1)
Total Bilirubin: 0.7 mg/dL (ref 0.3–1.2)

## 2018-01-23 LAB — URINALYSIS, COMPLETE (UACMP) WITH MICROSCOPIC
BILIRUBIN URINE: NEGATIVE
Bacteria, UA: NONE SEEN
GLUCOSE, UA: NEGATIVE mg/dL
Hgb urine dipstick: NEGATIVE
KETONES UR: NEGATIVE mg/dL
LEUKOCYTES UA: NEGATIVE
Nitrite: NEGATIVE
PH: 6 (ref 5.0–8.0)
Protein, ur: NEGATIVE mg/dL
Specific Gravity, Urine: 1.014 (ref 1.005–1.030)

## 2018-01-23 LAB — CBC
HCT: 39.2 % (ref 35.0–47.0)
Hemoglobin: 13.4 g/dL (ref 12.0–16.0)
MCH: 30.8 pg (ref 26.0–34.0)
MCHC: 34.1 g/dL (ref 32.0–36.0)
MCV: 90.2 fL (ref 80.0–100.0)
PLATELETS: 265 10*3/uL (ref 150–440)
RBC: 4.35 MIL/uL (ref 3.80–5.20)
RDW: 13.5 % (ref 11.5–14.5)
WBC: 7.2 10*3/uL (ref 3.6–11.0)

## 2018-01-23 LAB — POCT PREGNANCY, URINE: Preg Test, Ur: NEGATIVE

## 2018-01-23 LAB — LIPASE, BLOOD: Lipase: 86 U/L — ABNORMAL HIGH (ref 11–51)

## 2018-01-23 MED ORDER — KETOROLAC TROMETHAMINE 30 MG/ML IJ SOLN
30.0000 mg | Freq: Once | INTRAMUSCULAR | Status: DC
  Filled 2018-01-23: qty 1

## 2018-01-23 MED ORDER — KETOROLAC TROMETHAMINE 30 MG/ML IJ SOLN
15.0000 mg | Freq: Once | INTRAMUSCULAR | Status: AC
  Administered 2018-01-23: 15 mg via INTRAVENOUS

## 2018-01-23 NOTE — Discharge Instructions (Addendum)
You were seen in the emergency room for abdominal pain. It is important that you follow up closely with your primary OBGYN clinic (Dr. Valentino Saxonherry) in the next couple of days.   Please return to the emergency room right away if you are to develop a fever, severe nausea, your pain becomes severe or worsens, you have vaginal bleeding with the pain, you are unable to keep food down, begin vomiting any dark or bloody fluid, you develop any dark or bloody stools, feel dehydrated, or other new concerns or symptoms arise.

## 2018-01-23 NOTE — ED Notes (Signed)
ED Provider at bedside. 

## 2018-01-23 NOTE — ED Triage Notes (Signed)
Lower abdominal pain x 2 weeks. Denies vomiting or diarrhea.

## 2018-01-23 NOTE — ED Provider Notes (Signed)
[facility] Emergency Department Provider Note   ____________________________________________   First MD Initiated Contact with Patient 01/23/18 1019     (approximate)  I have reviewed the triage vital signs and the nursing notes.   HISTORY  Chief Complaint Abdominal Pain    HPI Melissa Mcmahon is a 27 y.o. female here for evaluation of "pelvic pain"  Patient reports that for about 3 or more years now she is been experiencing pain in her lower pelvis that comes and goes sometimes lasting a few weeks, sometimes daily.  She reports that pain has been steady for about 2 weeks time now, but it did worsen some after intercourse was attempted last night.  She reports is a sharp discomfort in her lower pelvis, worsened by intercourse and relieved by discontinuation.  Currently reports a moderate ongoing sharp feeling in her lower pelvis.  No fevers or chills no nausea vomiting.  Reports she is seeing gynecology multiple times for this and they have done laparoscopies as well as told her that she may need to have her uterus removed at some point if they cannot find a cause.  No vomiting or diarrhea.  Past Medical History:  Diagnosis Date  . Acid reflux   . Asthma    H/O AS A CHILD-NO INHALERS   . Endometriosis   . Headache    MIGRAINES-RARE    Patient Active Problem List   Diagnosis Date Noted  . Symptomatic cholelithiasis 10/02/2017    Past Surgical History:  Procedure Laterality Date  . CHOLECYSTECTOMY N/A 10/07/2017   Procedure: LAPAROSCOPIC CHOLECYSTECTOMY;  Surgeon: Ancil Linsey, MD;  Location: [facility] ORS;  Service: General;  Laterality: N/A;  . FEET SURGERY    . KNEE ARTHROSCOPY    . LAPAROSCOPY N/A 02/08/2015   Procedure: LAPAROSCOPY DIAGNOSTIC;  Surgeon: Vena Austria, MD;  Location: [facility] ORS;  Service: Gynecology;  Laterality: N/A;  . TUBAL LIGATION      Prior to Admission medications   Medication Sig Start Date End Date Taking?  Authorizing Provider  danazol (DANOCRINE) 200 MG capsule Take 1 capsule (200 mg total) by mouth 2 (two) times daily. 12/04/17   Hildred Laser, MD  omeprazole (PRILOSEC) 20 MG capsule Take 20 mg by mouth every morning.     [provider]    Allergies Penicillins  Family History  Problem Relation Age of Onset  . Diabetes Father   . Diabetes Maternal Aunt   . Breast cancer Neg Hx   . Ovarian cancer Neg Hx   . Colon cancer Neg Hx     Social History Social History   Tobacco Use  . Smoking status: Former Smoker    Packs/day: 1.00    Years: 7.00    Pack years: 7.00    Types: E-cigarettes, Cigarettes    Last attempt to quit: 06/07/2017    Years since quitting: 0.6  . Smokeless tobacco: Current User  Substance Use Topics  . Alcohol use: No  . Drug use: No    Review of Systems Constitutional: No fever/chills. Eyes: No visual changes. ENT: No sore throat. Cardiovascular: Denies chest pain. Respiratory: Denies shortness of breath. Gastrointestinal:  No nausea, no vomiting.  No diarrhea.  No constipation.  Reports a sharp pain in the lower pelvis. Genitourinary: Negative for dysuria. Musculoskeletal: Negative for back pain. Skin: Negative for rash. Neurological: Negative for headaches, focal weakness or numbness.    ____________________________________________   PHYSICAL EXAM:  VITAL SIGNS: ED Triage Vitals [01/23/18 1016]  Enc Vitals Group     BP 131/87     Pulse Rate 90     Resp 18     Temp 98.5 F (36.9 C)     Temp Source Oral     SpO2 100 %     Weight 140 lb (63.5 kg)     Height 5\' 3"  (1.6 m)     Head Circumference      Peak Flow      Pain Score 7     Pain Loc      Pain Edu?      Excl. in GC?    Constitutional: Alert and oriented. Well appearing and in no acute distress. Eyes: Conjunctivae are normal. Head: Atraumatic. Nose: No congestion/rhinnorhea. Mouth/Throat: Mucous membranes are moist. Neck: No stridor.   Cardiovascular: Normal rate,  regular rhythm. Grossly normal heart sounds.  Good peripheral circulation. Respiratory: Normal respiratory effort.  No retractions. Lungs CTAB. Gastrointestinal: Soft and nontender except for some mild discomfort to palpation suprapubically.  No focal pain to McBurney's point.  No rebound or guarding. No distention. Musculoskeletal: No lower extremity tenderness nor edema. Neurologic:  Normal speech and language. No gross focal neurologic deficits are appreciated.  Skin:  Skin is warm, dry and intact. No rash noted. Psychiatric: Mood and affect are normal. Speech and behavior are normal.  Discussed performing pelvic exam, patient reports she saw her doctor just about a month ago for the same symptoms and had pelvic examination and lab work done at that time.  Patient declined pelvic exam at this time. ____________________________________________   LABS (all labs ordered are listed, but only abnormal results are displayed)  Labs Reviewed  LIPASE, BLOOD - Abnormal; Notable for the following components:      Result Value   Lipase 86 (*)    All other components within normal limits  COMPREHENSIVE METABOLIC PANEL - Abnormal; Notable for the following components:   Glucose, Bld 120 (*)    All other components within normal limits  URINALYSIS, COMPLETE (UACMP) WITH MICROSCOPIC - Abnormal; Notable for the following components:   Color, Urine YELLOW (*)    APPearance CLEAR (*)    All other components within normal limits  CBC  POC URINE PREG, ED  POCT PREGNANCY, URINE   ____________________________________________  EKG   ____________________________________________  RADIOLOGY  Normal transabdominal and endovaginal pelvic ultrasound. No findings to account for the patient's pain.  Reviewed radiology ultrasound report as above ____________________________________________   PROCEDURES  Procedure(s) performed: None  Procedures  Critical Care performed:  No  ____________________________________________   INITIAL IMPRESSION / ASSESSMENT AND PLAN / ED COURSE  Pertinent labs & imaging results that were available during my care of the patient were reviewed by me and considered in my medical decision making (see chart for details).  Patient returns for evaluation of lower pelvic pain, clinical examination very reassuring except for some pubic discomfort.  Reports pelvic examination and follow-up with Dr. Valentino Saxon of obstetrics and gynecology for same in the past.  Discussed with the patient, will proceed with ultrasound to further evaluate for pelvic pain.  Felt low risk for acute intra-abdominal infection.  Denies discharge, denies bleeding, no fevers, no systemic symptoms.  Patient presents for evaluation of lower pelvic pain.  Clinical history indicates in discussion with the patient this is been an off-and-on intermittent problem for about 3 or more years time.  It comes and goes, has yet been identified as a clear etiology.  She is had extensive outpatient  evaluation including laparoscopy.  Denies any acute intra-abdominal symptoms that would suggest appendicitis cholecystitis, or acute abdomen at this time.  Reassuring evaluation and labs.  Just a very slightly elevated lipase without epigastric pain nausea vomiting or symptoms of pancreatitis.  LFTs are normal.  Have communicated with Dr. Valentino Saxonherry, patient has a follow-up appointment on Monday with her.    Discussed with the patient, she will follow-up closely with Dr. Valentino Saxonherry. Return precautions and treatment recommendations and follow-up discussed with the patient who is agreeable with the plan.  ____________________________________________   FINAL CLINICAL IMPRESSION(S) / ED DIAGNOSES  Final diagnoses:  Pelvic pain      NEW MEDICATIONS STARTED DURING THIS VISIT:  Discharge Medication List as of 01/23/2018 12:43 PM       Note:  This document was prepared using Dragon voice  recognition software and may include unintentional dictation errors.     Sharyn CreamerQuale, Tamryn Popko, MD 01/23/18 [phone]

## 2018-01-25 ENCOUNTER — Encounter: Payer: Self-pay | Admitting: Obstetrics and Gynecology

## 2018-01-25 ENCOUNTER — Ambulatory Visit (INDEPENDENT_AMBULATORY_CARE_PROVIDER_SITE_OTHER): Payer: Commercial Managed Care - PPO | Admitting: Obstetrics and Gynecology

## 2018-01-25 VITALS — BP 138/91 | HR 72 | Ht 63.0 in | Wt 149.8 lb

## 2018-01-25 DIAGNOSIS — N941 Unspecified dyspareunia: Secondary | ICD-10-CM | POA: Diagnosis not present

## 2018-01-25 DIAGNOSIS — R102 Pelvic and perineal pain: Secondary | ICD-10-CM

## 2018-01-25 DIAGNOSIS — N920 Excessive and frequent menstruation with regular cycle: Secondary | ICD-10-CM | POA: Diagnosis not present

## 2018-01-25 DIAGNOSIS — N946 Dysmenorrhea, unspecified: Secondary | ICD-10-CM

## 2018-01-25 NOTE — Progress Notes (Signed)
Pt is present today for follow up from pelvic pain along with menses issues. Pt stated that her symptoms have increased since her last visit causing her to attend the ED. Marland Kitchen. Pt stated that she is having a lot of pain and her cycle last 9 days. Pt stated having pain with intercourse.

## 2018-01-25 NOTE — H&P (Signed)
   GYNECOLOGY PREOPERATIVE HISTORY AND PHYSICAL   Subjective:  Melissa Mcmahon is a 27 y.o. G2P2002 here for surgical management of pelvic pain (chronic), dyspareunia, and menorrhagia. No significant preoperative concerns.  Proposed surgery: Total vaginal hysterectomy with bilateral salpingectomy    Pertinent Gynecological History: Menses: flow is moderate, regular every month without intermenstrual spotting, usually lasting 8 to 10 days and with severe dysmenorrhea (cycles previously irregular prior to starting Danazol, but shorter, only lasting 5 days)  Bleeding: dysfunctional uterine bleeding Contraception: tubal ligation Last pap: normal (per patient). Date: 01/2014   Past Medical History:  Diagnosis Date  . Acid reflux   . Asthma    H/O AS A CHILD-NO INHALERS   . Endometriosis   . Headache    MIGRAINES-RARE   Past Surgical History:  Procedure Laterality Date  . CHOLECYSTECTOMY N/A 10/07/2017   Procedure: LAPAROSCOPIC CHOLECYSTECTOMY;  Surgeon: Davis, Jason Evan, MD;  Location: [facility] ORS;  Service: General;  Laterality: N/A;  . FEET SURGERY    . KNEE ARTHROSCOPY    . LAPAROSCOPY N/A 02/08/2015   Procedure: LAPAROSCOPY DIAGNOSTIC;  Surgeon: Andreas Staebler, MD;  Location: [facility] ORS;  Service: Gynecology;  Laterality: N/A;  . TUBAL LIGATION     OB History  Gravida Para Term Preterm AB Living  2 2 2     2  SAB TAB Ectopic Multiple Live Births          2    # Outcome Date GA Lbr Len/2nd Weight Sex Delivery Anes PTL Lv  2 Term 2014    F Vag-Spont   LIV  1 Term 2012    F Vag-Spont   LIV    Family History  Problem Relation Age of Onset  . Diabetes Father   . Diabetes Maternal Aunt   . Breast cancer Neg Hx   . Ovarian cancer Neg Hx   . Colon cancer Neg Hx     Social History   Socioeconomic History  . Marital status: Legally Separated    Spouse name: Not on file  . Number of children: Not on file  . Years of education: Not on file  . Highest education level:  Not on file  Occupational History  . Not on file  Social Needs  . Financial resource strain: Not on file  . Food insecurity:    Worry: Not on file    Inability: Not on file  . Transportation needs:    Medical: Not on file    Non-medical: Not on file  Tobacco Use  . Smoking status: Former Smoker    Packs/day: 1.00    Years: 7.00    Pack years: 7.00    Types: E-cigarettes, Cigarettes    Last attempt to quit: 06/07/2017    Years since quitting: 0.6  . Smokeless tobacco: Current User  Substance and Sexual Activity  . Alcohol use: No  . Drug use: No  . Sexual activity: Yes    Birth control/protection: Surgical    Comment: tubal  Lifestyle  . Physical activity:    Days per week: Not on file    Minutes per session: Not on file  . Stress: Not on file  Relationships  . Social connections:    Talks on phone: Not on file    Gets together: Not on file    Attends religious service: Not on file    Active member of club or organization: Not on file    Attends meetings of clubs or organizations:   Not on file    Relationship status: Not on file  . Intimate partner violence:    Fear of current or ex partner: Not on file    Emotionally abused: Not on file    Physically abused: Not on file    Forced sexual activity: Not on file  Other Topics Concern  . Not on file  Social History Narrative  . Not on file    Current Outpatient Medications on File Prior to Visit  Medication Sig Dispense Refill  . danazol (DANOCRINE) 200 MG capsule Take 1 capsule (200 mg total) by mouth 2 (two) times daily. 60 capsule 6  . omeprazole (PRILOSEC) 20 MG capsule Take 20 mg by mouth every morning.      No current facility-administered medications on file prior to visit.    Allergies  Allergen Reactions  . Penicillins Hives    Has patient had a PCN reaction causing immediate rash, facial/tongue/throat swelling, SOB or lightheadedness with hypotension: unkn Has patient had a PCN reaction causing severe  rash involving mucus membranes or skin necrosis: no Has patient had a PCN reaction that required hospitalization: no Has patient had a PCN reaction occurring within the last 10 years: no If all of the above answers are "NO", then may proceed with Cephalosporin use.      Review of Systems Constitutional: No recent fever/chills/sweats Respiratory: No recent cough/bronchitis Cardiovascular: No chest pain Gastrointestinal: No recent nausea/vomiting/diarrhea Genitourinary: No UTI symptoms Hematologic/lymphatic:No history of coagulopathy or recent blood thinner use    Objective:   Blood pressure (!) 138/91, pulse 72, height 5' 3" (1.6 m), weight 149 lb 12.8 oz (67.9 kg), last menstrual period 01/11/2018. CONSTITUTIONAL: Well-developed, well-nourished female in no acute distress.  HENT:  Normocephalic, atraumatic, External right and left ear normal. Oropharynx is clear and moist EYES: Conjunctivae and EOM are normal. Pupils are equal, round, and reactive to light. No scleral icterus.  NECK: Normal range of motion, supple, no masses SKIN: Skin is warm and dry. No rash noted. Not diaphoretic. No erythema. No pallor. NEUROLOGIC: Alert and oriented to person, place, and time. Normal reflexes, muscle tone coordination. No cranial nerve deficit noted. PSYCHIATRIC: Normal mood and affect. Normal behavior. Normal judgment and thought content. CARDIOVASCULAR: Normal heart rate noted, regular rhythm RESPIRATORY: Effort and breath sounds normal, no problems with respiration noted ABDOMEN: Soft, nondistended. Tender in lower abdomen.  PELVIC: Deferred MUSCULOSKELETAL: Normal range of motion. No edema and no tenderness. 2+ distal pulses.    Labs: Results for orders placed or performed during the hospital encounter of 01/23/18 (from the past 336 hour(s))  Lipase, blood   Collection Time: 01/23/18 10:56 AM  Result Value Ref Range   Lipase 86 (H) 11 - 51 U/L  Comprehensive metabolic panel    Collection Time: 01/23/18 10:56 AM  Result Value Ref Range   Sodium 141 135 - 145 mmol/L   Potassium 4.0 3.5 - 5.1 mmol/L   Chloride 106 98 - 111 mmol/L   CO2 30 22 - 32 mmol/L   Glucose, Bld 120 (H) 70 - 99 mg/dL   BUN 15 6 - 20 mg/dL   Creatinine, Ser 0.63 0.44 - 1.00 mg/dL   Calcium 8.9 8.9 - 10.3 mg/dL   Total Protein 6.5 6.5 - 8.1 g/dL   Albumin 4.1 3.5 - 5.0 g/dL   AST 20 15 - 41 U/L   ALT 22 0 - 44 U/L   Alkaline Phosphatase 55 38 - 126 U/L   Total Bilirubin 0.7   0.3 - 1.2 mg/dL   GFR calc non Af Amer >60 >60 mL/min   GFR calc Af Amer >60 >60 mL/min   Anion gap 5 5 - 15  CBC   Collection Time: 01/23/18 10:56 AM  Result Value Ref Range   WBC 7.2 3.6 - 11.0 K/uL   RBC 4.35 3.80 - 5.20 MIL/uL   Hemoglobin 13.4 12.0 - 16.0 g/dL   HCT 39.2 35.0 - 47.0 %   MCV 90.2 80.0 - 100.0 fL   MCH 30.8 26.0 - 34.0 pg   MCHC 34.1 32.0 - 36.0 g/dL   RDW 13.5 11.5 - 14.5 %   Platelets 265 150 - 440 K/uL  Urinalysis, Complete w Microscopic   Collection Time: 01/23/18 10:56 AM  Result Value Ref Range   Color, Urine YELLOW (A) YELLOW   APPearance CLEAR (A) CLEAR   Specific Gravity, Urine 1.014 1.005 - 1.030   pH 6.0 5.0 - 8.0   Glucose, UA NEGATIVE NEGATIVE mg/dL   Hgb urine dipstick NEGATIVE NEGATIVE   Bilirubin Urine NEGATIVE NEGATIVE   Ketones, ur NEGATIVE NEGATIVE mg/dL   Protein, ur NEGATIVE NEGATIVE mg/dL   Nitrite NEGATIVE NEGATIVE   Leukocytes, UA NEGATIVE NEGATIVE   WBC, UA 0-5 0 - 5 WBC/hpf   Bacteria, UA NONE SEEN NONE SEEN   Squamous Epithelial / LPF 0-5 0 - 5   Mucus PRESENT   Pregnancy, urine POC   Collection Time: 01/23/18 11:06 AM  Result Value Ref Range   Preg Test, Ur NEGATIVE NEGATIVE     Imaging Studies: Us Transvaginal Non-ob  Result Date: 01/23/2018 CLINICAL DATA:  Pelvic pain after intercourse last night. EXAM: TRANSABDOMINAL AND TRANSVAGINAL ULTRASOUND OF PELVIS DOPPLER ULTRASOUND OF OVARIES TECHNIQUE: Both transabdominal and transvaginal ultrasound  examinations of the pelvis were performed. Transabdominal technique was performed for global imaging of the pelvis including uterus, ovaries, adnexal regions, and pelvic cul-de-sac. It was necessary to proceed with endovaginal exam following the transabdominal exam to visualize the endometrium and ovaries to better advantage. Color and duplex Doppler ultrasound was utilized to evaluate blood flow to the ovaries. COMPARISON:  None. FINDINGS: Uterus Measurements: 8.5 x 4.8 x 5.5 cm. No fibroids or other mass visualized. Endometrium Thickness: 10 mm.  No focal abnormality visualized. Right ovary Measurements: 3.3 x 2.1 x 2.4 cm. Dominant exophytic follicular cyst measuring 1.9 cm. Ovary otherwise unremarkable. No adnexal masses. Left ovary Measurements: 3.4 x 2.2 x 2.0 cm. Normal appearance/no adnexal mass. Pulsed Doppler evaluation of both ovaries demonstrates normal low-resistance arterial and venous waveforms. Other findings No abnormal free fluid. IMPRESSION: 1. Normal transabdominal and endovaginal pelvic ultrasound. No findings to account for the patient's pain. Electronically Signed   By: David  Ormond M.D.   On: 01/23/2018 12:06   Us Pelvis Transvanginal Non-ob (tv Only)  Result Date: 12/30/2017 ULTRASOUND REPORT Location: [facility] Date of Service:  12/30/2017 Indications: Severe pelvic pain Findings: The uterus measures 10.3 x 5.3 x 4.4 cm. Echo texture is homogeneous without evidence of focal masses. The Endometrium measures 7.5 mm and appears trilaminar. Right Ovary measures 2.9 x 2.5 x 2.0 cm. It is normal in appearance. A right exophytic cyst is noted measuring 1.3 x 2.0 x 1.9 cm. Left Ovary measures 3.1 x 2.6 x 2.8 cm and a corpus luteal cyst is noted measuring 2.2 x 2.1 x 1.8 cm. It is normal appearance. Survey of the adnexa demonstrates no adnexal masses. There is no free fluid in the cul de sac. Impression:   1. Anteverted uterus appears of normal size and contour. 2. Endometrium  measures 7.5 mm and appears trilaminar. 3. Bilateral ovaries appear WNL.  Left ovary corpus luteal cyst. 4. Right exophytic cyst. Recommendations: 1.Clinical correlation with the patient's History and Physical Exam. Jill Long, RDMS I have reviewed this study and agree with documented findings. Chauncey Bruno, MD [facility]   Us Pelvis Complete  Result Date: 01/23/2018 CLINICAL DATA:  Pelvic pain after intercourse last night. EXAM: TRANSABDOMINAL AND TRANSVAGINAL ULTRASOUND OF PELVIS DOPPLER ULTRASOUND OF OVARIES TECHNIQUE: Both transabdominal and transvaginal ultrasound examinations of the pelvis were performed. Transabdominal technique was performed for global imaging of the pelvis including uterus, ovaries, adnexal regions, and pelvic cul-de-sac. It was necessary to proceed with endovaginal exam following the transabdominal exam to visualize the endometrium and ovaries to better advantage. Color and duplex Doppler ultrasound was utilized to evaluate blood flow to the ovaries. COMPARISON:  None. FINDINGS: Uterus Measurements: 8.5 x 4.8 x 5.5 cm. No fibroids or other mass visualized. Endometrium Thickness: 10 mm.  No focal abnormality visualized. Right ovary Measurements: 3.3 x 2.1 x 2.4 cm. Dominant exophytic follicular cyst measuring 1.9 cm. Ovary otherwise unremarkable. No adnexal masses. Left ovary Measurements: 3.4 x 2.2 x 2.0 cm. Normal appearance/no adnexal mass. Pulsed Doppler evaluation of both ovaries demonstrates normal low-resistance arterial and venous waveforms. Other findings No abnormal free fluid. IMPRESSION: 1. Normal transabdominal and endovaginal pelvic ultrasound. No findings to account for the patient's pain. Electronically Signed   By: David  Ormond M.D.   On: 01/23/2018 12:06   Us Art/ven Flow Abd Pelv Doppler  Result Date: 01/23/2018 CLINICAL DATA:  Pelvic pain after intercourse last night. EXAM: TRANSABDOMINAL AND TRANSVAGINAL ULTRASOUND OF PELVIS DOPPLER ULTRASOUND OF  OVARIES TECHNIQUE: Both transabdominal and transvaginal ultrasound examinations of the pelvis were performed. Transabdominal technique was performed for global imaging of the pelvis including uterus, ovaries, adnexal regions, and pelvic cul-de-sac. It was necessary to proceed with endovaginal exam following the transabdominal exam to visualize the endometrium and ovaries to better advantage. Color and duplex Doppler ultrasound was utilized to evaluate blood flow to the ovaries. COMPARISON:  None. FINDINGS: Uterus Measurements: 8.5 x 4.8 x 5.5 cm. No fibroids or other mass visualized. Endometrium Thickness: 10 mm.  No focal abnormality visualized. Right ovary Measurements: 3.3 x 2.1 x 2.4 cm. Dominant exophytic follicular cyst measuring 1.9 cm. Ovary otherwise unremarkable. No adnexal masses. Left ovary Measurements: 3.4 x 2.2 x 2.0 cm. Normal appearance/no adnexal mass. Pulsed Doppler evaluation of both ovaries demonstrates normal low-resistance arterial and venous waveforms. Other findings No abnormal free fluid. IMPRESSION: 1. Normal transabdominal and endovaginal pelvic ultrasound. No findings to account for the patient's pain. Electronically Signed   By: David  Ormond M.D.   On: 01/23/2018 12:06    Assessment:     Chronic pelvic pain Dysmenorrhea Menorrhagia with regular cycle   Plan:    Counseling: Procedure, risks, reasons, benefits and complications (including injury to bowel, bladder, major blood vessel, ureter, bleeding, possibility of transfusion, infection, or fistula formation) reviewed in detail. Likelihood of success in alleviating the patient's condition was discussed. Routine postoperative instructions will be reviewed with the patient and her family in detail after surgery.  The patient concurred with the proposed plan, giving informed written consent for the surgery.   Preop testing ordered. Instructions reviewed, including NPO after midnight.     Dorothe Elmore, MD [facility]   

## 2018-01-25 NOTE — Patient Instructions (Signed)
You are scheduled for surgery on 02/01/2018.  Nothing to eat after midnight on day prior to surgery.  Do not take any medications unless recommended by your provider on day prior to surgery.  Do not take NSAIDs (Motrin, Aleve) or aspirin 7 days prior to surgery.  You may take Tylenol products for minor aches and pains.  You will receive a prescription for pain medications post-operatively.  You will be contacted by phone approximately 1 week prior to surgery to schedule pre-operative appointment.  Please call the office if you have any questions regarding your upcoming surgery.      Vaginal Hysterectomy A vaginal hysterectomy is a procedure to remove all or part of the uterus through a small incision in the vagina. In this procedure, your health care provider may remove your entire uterus, including the lower end (cervix). You may need a vaginal hysterectomy to treat:  Uterine fibroids.  A condition that causes the lining of the uterus to grow in other areas (endometriosis).  Problems with pelvic support.  Cancer of the cervix, ovaries, uterus, or tissue that lines the uterus (endometrium).  Excessive (dysfunctional) uterine bleeding.  When removing your uterus, your health care provider may also remove the organs that produce eggs (ovaries) and the tubes that carry eggs to your uterus (fallopian tubes). After a vaginal hysterectomy, you will no longer be able to have a baby. You will also no longer get your menstrual period. Tell a health care provider about:  Any allergies you have.  All medicines you are taking, including vitamins, herbs, eye drops, creams, and over-the-counter medicines.  Any problems you or family members have had with anesthetic medicines.  Any blood disorders you have.  Any surgeries you have had.  Any medical conditions you have.  Whether you are pregnant or may be pregnant. What are the risks? Generally, this is a safe procedure. However, problems  may occur, including:  Bleeding.  Infection.  A blood clot that forms in your leg and travels to your lungs (pulmonary embolism).  Damage to surrounding organs.  Pain during sex.  What happens before the procedure?  Ask your health care provider what organs will be removed during surgery.  Ask your health care provider about: ? Changing or stopping your regular medicines. This is especially important if you are taking diabetes medicines or blood thinners. ? Taking medicines such as aspirin and ibuprofen. These medicines can thin your blood. Do not take these medicines before your procedure if your health care provider instructs you not to.  Follow instructions from your health care provider about eating or drinking restrictions.  Do not use any tobacco products, such as cigarettes, chewing tobacco, and e-cigarettes. If you need help quitting, ask your health care provider.  Plan to have someone take you home after discharge from the hospital. What happens during the procedure?  To reduce your risk of infection: ? Your health care team will wash or sanitize their hands. ? Your skin will be washed with soap.  An IV tube will be inserted into one of your veins.  You may be given antibiotic medicine to help prevent infection.  You will be given one or more of the following: ? A medicine to help you relax (sedative). ? A medicine to numb the area (local anesthetic). ? A medicine to make you fall asleep (general anesthetic). ? A medicine that is injected into an area of your body to numb everything beyond the injection site (regional anesthetic).  Your  surgeon will make an incision in your vagina.  Your surgeon will locate and remove all or part of your uterus.  Your ovaries and fallopian tubes may be removed at the same time.  The incision will be closed with stitches (sutures) that dissolve over time. The procedure may vary among health care providers and hospitals. What  happens after the procedure?  Your blood pressure, heart rate, breathing rate, and blood oxygen level will be monitored often until the medicines you were given have worn off.  You will be encouraged to get up and walk around after a few hours to help prevent complications.  You may have IV tubes in place for a few days.  You will be given pain medicine as needed.  Do not drive for 24 hours if you were given a sedative. This information is not intended to replace advice given to you by your health care provider. Make sure you discuss any questions you have with your health care provider. Document Released: 08/13/2015 Document Revised: 09/27/2015 Document Reviewed: 05/06/2015 Elsevier Interactive Patient Education  [employer].

## 2018-01-25 NOTE — Progress Notes (Signed)
GYNECOLOGY PROGRESS NOTE  Subjective:    Patient ID: Melissa Mcmahon, female    DOB: January 03, 1991, 27 y.o.   MRN: [id number]  HPI  Patient is a 27 y.o. 292P2002 female who presents for ER follow up for pelvic pain.  Patient has had a history of pelvic pain for several years. Currently on Danazol for emperic treatment of suspected endometriosis based on symptoms (athough laparoscopy ~ 3 years ago with negative findings).  She presented to the ER on this past Saturday due to complaints of pelvic pain during intercourse that progressively worsened during the night to the point of being unbearable. She had been having a steady lower baseline pain for ~ 2 weeks prior. She was treated with IV pain medications, and was able to discharge home.   Of note, she also complains that her cycles have become prolonged, now lasting for approximately 9 days since starting the Danazol. Still noting moderate to severe dysmenorrhea as well.  Cycles were previously irregular, but only lasting 5 days prior to initiation of Danazol. She states that she is tired of dealing with the pain and the bleeding, and also taking medications that don't seem to be helping her.  She would like to consider a hysterectomy if possible.   The following portions of the patient's history were reviewed and updated as appropriate: allergies, current medications, past family history, past medical history, past social history, past surgical history and problem list.  Review of Systems Pertinent items noted in HPI and remainder of comprehensive ROS otherwise negative.   Objective:   Blood pressure (!) 138/91, pulse 72, height 5\' 3"  (1.6 m), weight 149 lb 12.8 oz (67.9 kg), last menstrual period 01/11/2018. General appearance: alert and no distress Abdomen: soft, mildly tender in lower abdomen, no rebound or guarding. No organomegaly, bowel sounds present Pelvic: Bimanual exam performed with uterus normal shape, size, mobile. Good descensus.  Bilateral adnexae non-tender.  Extremities: extremities normal, atraumatic, no cyanosis or edema Neurologic: Grossly normal   Assessment:   Chronic pelvic pain Menorrhagia Dysmenorrhea Dypsareunia   Plan:   - Symptoms resemble endometriosis, however has had negative workup in the past including negative diagnostic laparoscopy. She has not been successful on medication treatments (including OCPs, IUD, and now Danazol). Should also consider adenomyosis in differential. Patient now desires definitive management with hysterectomy.  I proposed doing a total vaginal hysterectomy (TVH) and prophylactic bilateral salpingectomy.  No indication for oophorectomy. She has had a previous tubal ligation in the past and is not concerned regarding future childbearing.  Patient agrees with this proposed surgery.  The risks of surgery were discussed in detail with the patient including but not limited to: bleeding which may require transfusion or reoperation; infection which may require antibiotics; injury to bowel, bladder, ureters or other surrounding organs; need for additional procedures including laparotomy; thromboembolic phenomenon, incisional problems and other postoperative/anesthesia complications.  Patient was also advised that she will remain in house for 1-2 nights; and expected recovery time after a hysterectomy is 6-8 weeks.  Likelihood of success in alleviating the patient's symptoms was discussed.   She was told that she will be contacted by our surgical scheduler regarding the time and date of her surgery; routine preoperative instructions of having nothing to eat or drink after midnight on the day prior to surgery and also coming to the hospital 1.5 hours prior to her time of surgery were also emphasized.  She was told she may be called for a preoperative appointment  about a week prior to surgery and will be given further preoperative instructions at that visit.  Routine postoperative instructions  will be reviewed with the patient and her family in detail after surgery.  In the meantime, she will discontinue her Danazol. Bleeding precautions were reviewed. Printed patient education handouts about the procedure was given to the patient to review at home.  Surgery scheduled for 02/01/2018 (notes that she would like to have it done as soon as possible due to her job and lack of replacement so that she can resume working).    A total of 15 minutes were spent face-to-face with the patient during this encounter and over half of that time dealt with counseling and coordination of care.   Hildred Laser, MD [facility]

## 2018-01-25 NOTE — H&P (View-Only) (Signed)
GYNECOLOGY PREOPERATIVE HISTORY AND PHYSICAL   Subjective:  Melissa Mcmahon is a 27 y.o. [phone] here for surgical management of pelvic pain (chronic), dyspareunia, and menorrhagia. No significant preoperative concerns.  Proposed surgery: Total vaginal hysterectomy with bilateral salpingectomy    Pertinent Gynecological History: Menses: flow is moderate, regular every month without intermenstrual spotting, usually lasting 8 to 10 days and with severe dysmenorrhea (cycles previously irregular prior to starting Danazol, but shorter, only lasting 5 days)  Bleeding: dysfunctional uterine bleeding Contraception: tubal ligation Last pap: normal (per patient). Date: 01/2014   Past Medical History:  Diagnosis Date  . Acid reflux   . Asthma    H/O AS A CHILD-NO INHALERS   . Endometriosis   . Headache    MIGRAINES-RARE   Past Surgical History:  Procedure Laterality Date  . CHOLECYSTECTOMY N/A 10/07/2017   Procedure: LAPAROSCOPIC CHOLECYSTECTOMY;  Surgeon: Ancil Linseyavis, Jason Evan, MD;  Location: [facility] OR[facility];  Service: General;  Laterality: N/A;  . FEET SURGERY    . KNEE ARTHROSCOPY    . LAPAROSCOPY N/A 02/08/2015   Procedure: LAPAROSCOPY DIAGNOSTIC;  Surgeon: Vena AustriaAndreas Staebler, MD;  Location: [facility] ORS;  Service: Gynecology;  Laterality: N/A;  . TUBAL LIGATION     OB History  Gravida Para Term Preterm AB Living  2 2 2     2   SAB TAB Ectopic Multiple Live Births          2    # Outcome Date GA Lbr Len/2nd Weight Sex Delivery Anes PTL Lv  2 Term 2014    F Vag-Spont   LIV  1 Term 2012    F Vag-Spont   LIV    Family History  Problem Relation Age of Onset  . Diabetes Father   . Diabetes Maternal Aunt   . Breast cancer Neg Hx   . Ovarian cancer Neg Hx   . Colon cancer Neg Hx     Social History   Socioeconomic History  . Marital status: Legally Separated    Spouse name: Not on file  . Number of children: Not on file  . Years of education: Not on file  . Highest education level:  Not on file  Occupational History  . Not on file  Social Needs  . Financial resource strain: Not on file  . Food insecurity:    Worry: Not on file    Inability: Not on file  . Transportation needs:    Medical: Not on file    Non-medical: Not on file  Tobacco Use  . Smoking status: Former Smoker    Packs/day: 1.00    Years: 7.00    Pack years: 7.00    Types: E-cigarettes, Cigarettes    Last attempt to quit: 06/07/2017    Years since quitting: 0.6  . Smokeless tobacco: Current User  Substance and Sexual Activity  . Alcohol use: No  . Drug use: No  . Sexual activity: Yes    Birth control/protection: Surgical    Comment: tubal  Lifestyle  . Physical activity:    Days per week: Not on file    Minutes per session: Not on file  . Stress: Not on file  Relationships  . Social connections:    Talks on phone: Not on file    Gets together: Not on file    Attends religious service: Not on file    Active member of club or organization: Not on file    Attends meetings of clubs or organizations:  Not on file    Relationship status: Not on file  . Intimate partner violence:    Fear of current or ex partner: Not on file    Emotionally abused: Not on file    Physically abused: Not on file    Forced sexual activity: Not on file  Other Topics Concern  . Not on file  Social History Narrative  . Not on file    Current Outpatient Medications on File Prior to Visit  Medication Sig Dispense Refill  . danazol (DANOCRINE) 200 MG capsule Take 1 capsule (200 mg total) by mouth 2 (two) times daily. 60 capsule 6  . omeprazole (PRILOSEC) 20 MG capsule Take 20 mg by mouth every morning.      No current facility-administered medications on file prior to visit.    Allergies  Allergen Reactions  . Penicillins Hives    Has patient had a PCN reaction causing immediate rash, facial/tongue/throat swelling, SOB or lightheadedness with hypotension: unkn Has patient had a PCN reaction causing severe  rash involving mucus membranes or skin necrosis: no Has patient had a PCN reaction that required hospitalization: no Has patient had a PCN reaction occurring within the last 10 years: no If all of the above answers are "NO", then may proceed with Cephalosporin use.      Review of Systems Constitutional: No recent fever/chills/sweats Respiratory: No recent cough/bronchitis Cardiovascular: No chest pain Gastrointestinal: No recent nausea/vomiting/diarrhea Genitourinary: No UTI symptoms Hematologic/lymphatic:No history of coagulopathy or recent blood thinner use    Objective:   Blood pressure (!) 138/91, pulse 72, height 5\' 3"  (1.6 m), weight 149 lb 12.8 oz (67.9 kg), last menstrual period 01/11/2018. CONSTITUTIONAL: Well-developed, well-nourished female in no acute distress.  HENT:  Normocephalic, atraumatic, External right and left ear normal. Oropharynx is clear and moist EYES: Conjunctivae and EOM are normal. Pupils are equal, round, and reactive to light. No scleral icterus.  NECK: Normal range of motion, supple, no masses SKIN: Skin is warm and dry. No rash noted. Not diaphoretic. No erythema. No pallor. NEUROLOGIC: Alert and oriented to person, place, and time. Normal reflexes, muscle tone coordination. No cranial nerve deficit noted. PSYCHIATRIC: Normal mood and affect. Normal behavior. Normal judgment and thought content. CARDIOVASCULAR: Normal heart rate noted, regular rhythm RESPIRATORY: Effort and breath sounds normal, no problems with respiration noted ABDOMEN: Soft, nondistended. Tender in lower abdomen.  PELVIC: Deferred MUSCULOSKELETAL: Normal range of motion. No edema and no tenderness. 2+ distal pulses.    Labs: Results for orders placed or performed during the hospital encounter of 01/23/18 (from the past 336 hour(s))  Lipase, blood   Collection Time: 01/23/18 10:56 AM  Result Value Ref Range   Lipase 86 (H) 11 - 51 U/L  Comprehensive metabolic panel    Collection Time: 01/23/18 10:56 AM  Result Value Ref Range   Sodium 141 135 - 145 mmol/L   Potassium 4.0 3.5 - 5.1 mmol/L   Chloride 106 98 - 111 mmol/L   CO2 30 22 - 32 mmol/L   Glucose, Bld 120 (H) 70 - 99 mg/dL   BUN 15 6 - 20 mg/dL   Creatinine, Ser 1.61 0.44 - 1.00 mg/dL   Calcium 8.9 8.9 - 09.6 mg/dL   Total Protein 6.5 6.5 - 8.1 g/dL   Albumin 4.1 3.5 - 5.0 g/dL   AST 20 15 - 41 U/L   ALT 22 0 - 44 U/L   Alkaline Phosphatase 55 38 - 126 U/L   Total Bilirubin 0.7  0.3 - 1.2 mg/dL   GFR calc non Af Amer >60 >60 mL/min   GFR calc Af Amer >60 >60 mL/min   Anion gap 5 5 - 15  CBC   Collection Time: 01/23/18 10:56 AM  Result Value Ref Range   WBC 7.2 3.6 - 11.0 K/uL   RBC 4.35 3.80 - 5.20 MIL/uL   Hemoglobin 13.4 12.0 - 16.0 g/dL   HCT 09.8 11.9 - 14.7 %   MCV 90.2 80.0 - 100.0 fL   MCH 30.8 26.0 - 34.0 pg   MCHC 34.1 32.0 - 36.0 g/dL   RDW 82.9 56.2 - 13.0 %   Platelets 265 150 - 440 K/uL  Urinalysis, Complete w Microscopic   Collection Time: 01/23/18 10:56 AM  Result Value Ref Range   Color, Urine YELLOW (A) YELLOW   APPearance CLEAR (A) CLEAR   Specific Gravity, Urine 1.014 1.005 - 1.030   pH 6.0 5.0 - 8.0   Glucose, UA NEGATIVE NEGATIVE mg/dL   Hgb urine dipstick NEGATIVE NEGATIVE   Bilirubin Urine NEGATIVE NEGATIVE   Ketones, ur NEGATIVE NEGATIVE mg/dL   Protein, ur NEGATIVE NEGATIVE mg/dL   Nitrite NEGATIVE NEGATIVE   Leukocytes, UA NEGATIVE NEGATIVE   WBC, UA 0-5 0 - 5 WBC/hpf   Bacteria, UA NONE SEEN NONE SEEN   Squamous Epithelial / LPF 0-5 0 - 5   Mucus PRESENT   Pregnancy, urine POC   Collection Time: 01/23/18 11:06 AM  Result Value Ref Range   Preg Test, Ur NEGATIVE NEGATIVE     Imaging Studies: US Transvaginal Non-ob  Result Date: 01/23/2018 CLINICAL DATA:  Pelvic pain after intercourse last night. EXAM: TRANSABDOMINAL AND TRANSVAGINAL ULTRASOUND OF PELVIS DOPPLER ULTRASOUND OF OVARIES TECHNIQUE: Both transabdominal and transvaginal ultrasound  examinations of the pelvis were performed. Transabdominal technique was performed for global imaging of the pelvis including uterus, ovaries, adnexal regions, and pelvic cul-de-sac. It was necessary to proceed with endovaginal exam following the transabdominal exam to visualize the endometrium and ovaries to better advantage. Color and duplex Doppler ultrasound was utilized to evaluate blood flow to the ovaries. COMPARISON:  None. FINDINGS: Uterus Measurements: 8.5 x 4.8 x 5.5 cm. No fibroids or other mass visualized. Endometrium Thickness: 10 mm.  No focal abnormality visualized. Right ovary Measurements: 3.3 x 2.1 x 2.4 cm. Dominant exophytic follicular cyst measuring 1.9 cm. Ovary otherwise unremarkable. No adnexal masses. Left ovary Measurements: 3.4 x 2.2 x 2.0 cm. Normal appearance/no adnexal mass. Pulsed Doppler evaluation of both ovaries demonstrates normal low-resistance arterial and venous waveforms. Other findings No abnormal free fluid. IMPRESSION: 1. Normal transabdominal and endovaginal pelvic ultrasound. No findings to account for the patient's pain. Electronically Signed   By: Amie Portland M.D.   On: 01/23/2018 12:06   US Pelvis Transvanginal Non-ob (tv Only)  Result Date: 12/30/2017 ULTRASOUND REPORT Location: [facility] Date of Service:  12/30/2017 Indications: Severe pelvic pain Findings: The uterus measures 10.3 x 5.3 x 4.4 cm. Echo texture is homogeneous without evidence of focal masses. The Endometrium measures 7.5 mm and appears trilaminar. Right Ovary measures 2.9 x 2.5 x 2.0 cm. It is normal in appearance. A right exophytic cyst is noted measuring 1.3 x 2.0 x 1.9 cm. Left Ovary measures 3.1 x 2.6 x 2.8 cm and a corpus luteal cyst is noted measuring 2.2 x 2.1 x 1.8 cm. It is normal appearance. Survey of the adnexa demonstrates no adnexal masses. There is no free fluid in the cul de sac. Impression:  1. Anteverted uterus appears of normal size and contour. 2. Endometrium  measures 7.5 mm and appears trilaminar. 3. Bilateral ovaries appear WNL.  Left ovary corpus luteal cyst. 4. Right exophytic cyst. Recommendations: 1.Clinical correlation with the patient's History and Physical Exam. Kari Baars, RDMS I have reviewed this study and agree with documented findings. Hildred Laser, MD [facility]   US Pelvis Complete  Result Date: 01/23/2018 CLINICAL DATA:  Pelvic pain after intercourse last night. EXAM: TRANSABDOMINAL AND TRANSVAGINAL ULTRASOUND OF PELVIS DOPPLER ULTRASOUND OF OVARIES TECHNIQUE: Both transabdominal and transvaginal ultrasound examinations of the pelvis were performed. Transabdominal technique was performed for global imaging of the pelvis including uterus, ovaries, adnexal regions, and pelvic cul-de-sac. It was necessary to proceed with endovaginal exam following the transabdominal exam to visualize the endometrium and ovaries to better advantage. Color and duplex Doppler ultrasound was utilized to evaluate blood flow to the ovaries. COMPARISON:  None. FINDINGS: Uterus Measurements: 8.5 x 4.8 x 5.5 cm. No fibroids or other mass visualized. Endometrium Thickness: 10 mm.  No focal abnormality visualized. Right ovary Measurements: 3.3 x 2.1 x 2.4 cm. Dominant exophytic follicular cyst measuring 1.9 cm. Ovary otherwise unremarkable. No adnexal masses. Left ovary Measurements: 3.4 x 2.2 x 2.0 cm. Normal appearance/no adnexal mass. Pulsed Doppler evaluation of both ovaries demonstrates normal low-resistance arterial and venous waveforms. Other findings No abnormal free fluid. IMPRESSION: 1. Normal transabdominal and endovaginal pelvic ultrasound. No findings to account for the patient's pain. Electronically Signed   By: Amie Portland M.D.   On: 01/23/2018 12:06   [place] Art/ven Flow Abd Pelv Doppler  Result Date: 01/23/2018 CLINICAL DATA:  Pelvic pain after intercourse last night. EXAM: TRANSABDOMINAL AND TRANSVAGINAL ULTRASOUND OF PELVIS DOPPLER ULTRASOUND OF  OVARIES TECHNIQUE: Both transabdominal and transvaginal ultrasound examinations of the pelvis were performed. Transabdominal technique was performed for global imaging of the pelvis including uterus, ovaries, adnexal regions, and pelvic cul-de-sac. It was necessary to proceed with endovaginal exam following the transabdominal exam to visualize the endometrium and ovaries to better advantage. Color and duplex Doppler ultrasound was utilized to evaluate blood flow to the ovaries. COMPARISON:  None. FINDINGS: Uterus Measurements: 8.5 x 4.8 x 5.5 cm. No fibroids or other mass visualized. Endometrium Thickness: 10 mm.  No focal abnormality visualized. Right ovary Measurements: 3.3 x 2.1 x 2.4 cm. Dominant exophytic follicular cyst measuring 1.9 cm. Ovary otherwise unremarkable. No adnexal masses. Left ovary Measurements: 3.4 x 2.2 x 2.0 cm. Normal appearance/no adnexal mass. Pulsed Doppler evaluation of both ovaries demonstrates normal low-resistance arterial and venous waveforms. Other findings No abnormal free fluid. IMPRESSION: 1. Normal transabdominal and endovaginal pelvic ultrasound. No findings to account for the patient's pain. Electronically Signed   By: Amie Portland M.D.   On: 01/23/2018 12:06    Assessment:     Chronic pelvic pain Dysmenorrhea Menorrhagia with regular cycle   Plan:    Counseling: Procedure, risks, reasons, benefits and complications (including injury to bowel, bladder, major blood vessel, ureter, bleeding, possibility of transfusion, infection, or fistula formation) reviewed in detail. Likelihood of success in alleviating the patient's condition was discussed. Routine postoperative instructions will be reviewed with the patient and her family in detail after surgery.  The patient concurred with the proposed plan, giving informed written consent for the surgery.   Preop testing ordered. Instructions reviewed, including NPO after midnight.     Hildred Laser, MD [facility]

## 2018-01-28 ENCOUNTER — Encounter
Admission: RE | Admit: 2018-01-28 | Discharge: 2018-01-28 | Disposition: A | Discharge status: Home / Self Care | Payer: Commercial Managed Care - PPO | Source: Ambulatory Visit | Attending: Obstetrics and Gynecology | Admitting: Obstetrics and Gynecology

## 2018-01-28 ENCOUNTER — Other Ambulatory Visit: Payer: Self-pay

## 2018-01-28 HISTORY — DX: Personal history of other diseases of the digestive system: Z87.19

## 2018-01-28 NOTE — Patient Instructions (Signed)
Your procedure is scheduled on: 02-01-18 Report to Same Day Surgery 2nd floor medical mall [facility] Entrance-take elevator on left to 2nd floor.  Check in with surgery information desk.) To find out your arrival time please call [phone] between 1PM - 3PM on 01-29-18  Remember: Instructions that are not followed completely may result in serious medical risk, up to and including death, or upon the discretion of your surgeon and anesthesiologist your surgery may need to be rescheduled.    _x___ 1. Do not eat food after midnight the night before your procedure. You may drink clear liquids up to 2 hours before you are scheduled to arrive at the hospital for your procedure.  Do not drink clear liquids within 2 hours of your scheduled arrival to the hospital.  Clear liquids include  --Water or Apple juice without pulp  --Clear carbohydrate beverage such as ClearFast or Gatorade  --Black Coffee or Clear Tea (No milk, no creamers, do not add anything to the coffee or Tea   ____Ensure clear carbohydrate drink on the way to the hospital for bariatric patients  ____Ensure clear carbohydrate drink 3 hours before surgery for Dr Rutherford Nail patients if physician instructed.   No gum chewing or hard candies.     __x__ 2. No Alcohol for 24 hours before or after surgery.   __x__3. No Smoking or e-cigarettes for 24 prior to surgery.  Do not use any chewable tobacco products for at least 6 hour prior to surgery   ____  4. Bring all medications with you on the day of surgery if instructed.    __x__ 5. Notify your doctor if there is any change in your medical condition     (cold, fever, infections).    x___6. On the morning of surgery brush your teeth with toothpaste and water.  You may rinse your mouth with mouth wash if you wish.  Do not swallow any toothpaste or mouthwash.   Do not wear jewelry, make-up, hairpins, clips or nail polish.  Do not wear lotions, powders, or perfumes. You may wear  deodorant.  Do not shave 48 hours prior to surgery. Men may shave face and neck.  Do not bring valuables to the hospital.    [facility] is not responsible for any belongings or valuables.               Contacts, dentures or bridgework may not be worn into surgery.  Leave your suitcase in the car. After surgery it may be brought to your room.  For patients admitted to the hospital, discharge time is determined by your treatment team.  _  Patients discharged the day of surgery will not be allowed to drive home.  You will need someone to drive you home and stay with you the night of your procedure.    Please read over the following fact sheets that you were given:   [facility] Preparing for Surgery   _x___ TAKE THE FOLLOWING MEDICATION THE MORNING OF SURGERY WITH A SMALL SIP OF WATER. These include:  1. OMEPRAZOLE (PRILOSEC)  2. TAKE AN EXTRA PRILOSEC THE NIGHT BEFORE YOUR SURGERY  3.  4.  5.  6.  ____Fleets enema or Magnesium Citrate as directed.   ____ Use CHG Soap or sage wipes as directed on instruction sheet   ____ Use inhalers on the day of surgery and bring to hospital day of surgery  ____ Stop Metformin and Janumet 2 days prior to surgery.    ____  Take 1/2 of usual insulin dose the night before surgery and none on the morning surgery.   ____ Follow recommendations from Cardiologist, Pulmonologist or PCP regarding stopping Aspirin, Coumadin, Plavix ,Eliquis, Effient, or Pradaxa, and Pletal.  X____Stop Anti-inflammatories such as Advil, Aleve, Ibuprofen, Motrin, Naproxen, Naprosyn, Goodies powders or aspirin products NOW-OK to take Tylenol   ____ Stop supplements until after surgery.    ____ Bring C-Pap to the hospital.

## 2018-01-31 MED ORDER — CEFAZOLIN SODIUM-DEXTROSE 2-4 GM/100ML-% IV SOLN
2.0000 g | INTRAVENOUS | Status: AC
  Administered 2018-02-01: 2 g via INTRAVENOUS

## 2018-02-01 ENCOUNTER — Other Ambulatory Visit: Payer: Self-pay

## 2018-02-01 ENCOUNTER — Encounter
Admission: RE | Disposition: A | Discharge status: Home / Self Care | Payer: Self-pay | Source: Home / Self Care | Attending: Obstetrics and Gynecology

## 2018-02-01 ENCOUNTER — Inpatient Hospital Stay
Admission: RE | Admit: 2018-02-01 | Discharge: 2018-02-05 | DRG: 742 | Disposition: A | Discharge status: Home / Self Care | Payer: Commercial Managed Care - PPO | Attending: Obstetrics and Gynecology | Admitting: Obstetrics and Gynecology

## 2018-02-01 ENCOUNTER — Ambulatory Visit: Payer: Commercial Managed Care - PPO | Admitting: Certified Registered Nurse Anesthetist

## 2018-02-01 ENCOUNTER — Encounter: Payer: Self-pay | Admitting: *Deleted

## 2018-02-01 DIAGNOSIS — N871 Moderate cervical dysplasia: Secondary | ICD-10-CM

## 2018-02-01 DIAGNOSIS — N838 Other noninflammatory disorders of ovary, fallopian tube and broad ligament: Secondary | ICD-10-CM | POA: Diagnosis present

## 2018-02-01 DIAGNOSIS — N920 Excessive and frequent menstruation with regular cycle: Secondary | ICD-10-CM | POA: Diagnosis present

## 2018-02-01 DIAGNOSIS — N941 Unspecified dyspareunia: Secondary | ICD-10-CM

## 2018-02-01 DIAGNOSIS — N802 Endometriosis of fallopian tube: Secondary | ICD-10-CM

## 2018-02-01 DIAGNOSIS — N946 Dysmenorrhea, unspecified: Secondary | ICD-10-CM

## 2018-02-01 DIAGNOSIS — Z880 Allergy status to penicillin: Secondary | ICD-10-CM

## 2018-02-01 DIAGNOSIS — Z90710 Acquired absence of both cervix and uterus: Secondary | ICD-10-CM

## 2018-02-01 DIAGNOSIS — Z9851 Tubal ligation status: Secondary | ICD-10-CM

## 2018-02-01 DIAGNOSIS — R102 Pelvic and perineal pain: Secondary | ICD-10-CM

## 2018-02-01 DIAGNOSIS — G8929 Other chronic pain: Secondary | ICD-10-CM | POA: Diagnosis present

## 2018-02-01 DIAGNOSIS — Z9049 Acquired absence of other specified parts of digestive tract: Secondary | ICD-10-CM

## 2018-02-01 DIAGNOSIS — N1330 Unspecified hydronephrosis: Secondary | ICD-10-CM | POA: Diagnosis present

## 2018-02-01 DIAGNOSIS — K219 Gastro-esophageal reflux disease without esophagitis: Secondary | ICD-10-CM | POA: Diagnosis present

## 2018-02-01 DIAGNOSIS — R7989 Other specified abnormal findings of blood chemistry: Secondary | ICD-10-CM | POA: Diagnosis present

## 2018-02-01 DIAGNOSIS — N135 Crossing vessel and stricture of ureter without hydronephrosis: Secondary | ICD-10-CM

## 2018-02-01 DIAGNOSIS — Z9889 Other specified postprocedural states: Secondary | ICD-10-CM

## 2018-02-01 DIAGNOSIS — Z87891 Personal history of nicotine dependence: Secondary | ICD-10-CM

## 2018-02-01 DIAGNOSIS — Z960 Presence of urogenital implants: Secondary | ICD-10-CM

## 2018-02-01 HISTORY — PX: VAGINAL HYSTERECTOMY: SHX2639

## 2018-02-01 LAB — CREATININE, SERUM: CREATININE: 0.72 mg/dL (ref 0.44–1.00)

## 2018-02-01 LAB — TYPE AND SCREEN
ABO/RH(D): O NEG
Antibody Screen: NEGATIVE

## 2018-02-01 LAB — RAPID HIV SCREEN (HIV 1/2 AB+AG)
HIV 1/2 ANTIBODIES: NONREACTIVE
HIV-1 P24 Antigen - HIV24: NONREACTIVE

## 2018-02-01 LAB — ABO/RH: ABO/RH(D): O NEG

## 2018-02-01 LAB — POCT PREGNANCY, URINE: Preg Test, Ur: NEGATIVE

## 2018-02-01 SURGERY — HYSTERECTOMY, VAGINAL
Anesthesia: General | Laterality: Bilateral

## 2018-02-01 MED ORDER — SODIUM CHLORIDE 0.9 % IJ SOLN
INTRAMUSCULAR | Status: AC
  Filled 2018-02-01: qty 50

## 2018-02-01 MED ORDER — VASOPRESSIN 20 UNIT/ML IV SOLN
INTRAVENOUS | Status: AC
  Filled 2018-02-01: qty 1

## 2018-02-01 MED ORDER — LIDOCAINE HCL (CARDIAC) PF 100 MG/5ML IV SOSY
PREFILLED_SYRINGE | INTRAVENOUS | Status: DC | PRN
  Administered 2018-02-01: 100 mg via INTRAVENOUS

## 2018-02-01 MED ORDER — CEFAZOLIN SODIUM-DEXTROSE 2-4 GM/100ML-% IV SOLN
INTRAVENOUS | Status: AC
  Filled 2018-02-01: qty 100

## 2018-02-01 MED ORDER — MORPHINE SULFATE (PF) 2 MG/ML IV SOLN
2.0000 mg | Freq: Once | INTRAVENOUS | Status: AC
  Administered 2018-02-01: 2 mg via INTRAVENOUS
  Filled 2018-02-01: qty 1

## 2018-02-01 MED ORDER — LACTATED RINGERS IV SOLN
INTRAVENOUS | Status: DC
  Administered 2018-02-01 – 2018-02-02 (×3): via INTRAVENOUS

## 2018-02-01 MED ORDER — NICOTINE 21 MG/24HR TD PT24
21.0000 mg | MEDICATED_PATCH | Freq: Every day | TRANSDERMAL | Status: DC
  Filled 2018-02-01 (×2): qty 1

## 2018-02-01 MED ORDER — ONDANSETRON HCL 4 MG PO TABS
4.0000 mg | ORAL_TABLET | Freq: Four times a day (QID) | ORAL | Status: DC | PRN
  Administered 2018-02-03: 4 mg via ORAL
  Filled 2018-02-01: qty 1

## 2018-02-01 MED ORDER — LACTATED RINGERS IV SOLN: INTRAVENOUS | Status: DC

## 2018-02-01 MED ORDER — ROCURONIUM BROMIDE 50 MG/5ML IV SOLN
INTRAVENOUS | Status: AC
  Filled 2018-02-01: qty 1

## 2018-02-01 MED ORDER — PROPOFOL 10 MG/ML IV BOLUS
INTRAVENOUS | Status: AC
  Filled 2018-02-01: qty 40

## 2018-02-01 MED ORDER — ACETAMINOPHEN 10 MG/ML IV SOLN
INTRAVENOUS | Status: DC | PRN
  Administered 2018-02-01: 1000 mg via INTRAVENOUS

## 2018-02-01 MED ORDER — KETOROLAC TROMETHAMINE 30 MG/ML IJ SOLN
30.0000 mg | Freq: Four times a day (QID) | INTRAMUSCULAR | Status: DC
  Administered 2018-02-01 – 2018-02-02 (×3): 30 mg via INTRAVENOUS
  Filled 2018-02-01 (×2): qty 1

## 2018-02-01 MED ORDER — SODIUM CHLORIDE FLUSH 0.9 % IV SOLN
INTRAVENOUS | Status: AC
  Filled 2018-02-01: qty 10

## 2018-02-01 MED ORDER — LACTATED RINGERS IV SOLN
INTRAVENOUS | Status: DC
  Administered 2018-02-01: 07:00:00 via INTRAVENOUS

## 2018-02-01 MED ORDER — DEXAMETHASONE SODIUM PHOSPHATE 10 MG/ML IJ SOLN
INTRAMUSCULAR | Status: AC
  Filled 2018-02-01: qty 1

## 2018-02-01 MED ORDER — KETOROLAC TROMETHAMINE 30 MG/ML IJ SOLN
30.0000 mg | Freq: Four times a day (QID) | INTRAMUSCULAR | Status: DC
  Administered 2018-02-01: 30 mg via INTRAVENOUS
  Filled 2018-02-01 (×2): qty 1

## 2018-02-01 MED ORDER — KETOROLAC TROMETHAMINE 30 MG/ML IJ SOLN: 30.0000 mg | Freq: Four times a day (QID) | INTRAMUSCULAR | Status: DC

## 2018-02-01 MED ORDER — HYDROMORPHONE HCL 1 MG/ML IJ SOLN
0.2000 mg | INTRAMUSCULAR | Status: DC | PRN
  Administered 2018-02-01 (×5): 0.6 mg via INTRAVENOUS
  Administered 2018-02-02 (×2): 0.4 mg via INTRAVENOUS
  Administered 2018-02-02 – 2018-02-03 (×5): 0.6 mg via INTRAVENOUS
  Administered 2018-02-03: 0.4 mg via INTRAVENOUS
  Administered 2018-02-03 – 2018-02-04 (×5): 0.6 mg via INTRAVENOUS
  Administered 2018-02-04: 0.2 mg via INTRAVENOUS
  Filled 2018-02-01 (×19): qty 1

## 2018-02-01 MED ORDER — ROCURONIUM BROMIDE 100 MG/10ML IV SOLN
INTRAVENOUS | Status: DC | PRN
  Administered 2018-02-01: 50 mg via INTRAVENOUS

## 2018-02-01 MED ORDER — ONDANSETRON HCL 4 MG/2ML IJ SOLN
INTRAMUSCULAR | Status: DC | PRN
  Administered 2018-02-01: 4 mg via INTRAVENOUS

## 2018-02-01 MED ORDER — VASOPRESSIN 20 UNIT/ML IV SOLN
INTRAVENOUS | Status: DC | PRN
  Administered 2018-02-01: 10 mL via INTRAMUSCULAR

## 2018-02-01 MED ORDER — MIDAZOLAM HCL 2 MG/2ML IJ SOLN
INTRAMUSCULAR | Status: AC
  Filled 2018-02-01: qty 2

## 2018-02-01 MED ORDER — ONDANSETRON HCL 4 MG/2ML IJ SOLN
4.0000 mg | Freq: Once | INTRAMUSCULAR | Status: AC | PRN
  Administered 2018-02-01: 4 mg via INTRAVENOUS

## 2018-02-01 MED ORDER — ZOLPIDEM TARTRATE 5 MG PO TABS: 5.0000 mg | ORAL_TABLET | Freq: Every evening | ORAL | Status: DC | PRN

## 2018-02-01 MED ORDER — SIMETHICONE 80 MG PO CHEW: 80.0000 mg | CHEWABLE_TABLET | Freq: Four times a day (QID) | ORAL | Status: DC | PRN

## 2018-02-01 MED ORDER — MIDAZOLAM HCL 2 MG/2ML IJ SOLN
INTRAMUSCULAR | Status: DC | PRN
  Administered 2018-02-01: 2 mg via INTRAVENOUS

## 2018-02-01 MED ORDER — IBUPROFEN 800 MG PO TABS
800.0000 mg | ORAL_TABLET | Freq: Three times a day (TID) | ORAL | Status: DC | PRN
  Administered 2018-02-02 – 2018-02-04 (×7): 800 mg via ORAL
  Filled 2018-02-01 (×7): qty 1

## 2018-02-01 MED ORDER — SUGAMMADEX SODIUM 200 MG/2ML IV SOLN
INTRAVENOUS | Status: DC | PRN
  Administered 2018-02-01: 150 mg via INTRAVENOUS

## 2018-02-01 MED ORDER — DOCUSATE SODIUM 100 MG PO CAPS
100.0000 mg | ORAL_CAPSULE | Freq: Two times a day (BID) | ORAL | Status: DC
  Administered 2018-02-02 – 2018-02-05 (×7): 100 mg via ORAL
  Filled 2018-02-01 (×7): qty 1

## 2018-02-01 MED ORDER — METOCLOPRAMIDE HCL 5 MG/ML IJ SOLN
10.0000 mg | Freq: Once | INTRAMUSCULAR | Status: AC
  Administered 2018-02-01: 10 mg via INTRAVENOUS
  Filled 2018-02-01: qty 2

## 2018-02-01 MED ORDER — SUGAMMADEX SODIUM 200 MG/2ML IV SOLN
INTRAVENOUS | Status: AC
  Filled 2018-02-01: qty 2

## 2018-02-01 MED ORDER — FENTANYL CITRATE (PF) 100 MCG/2ML IJ SOLN
INTRAMUSCULAR | Status: AC
  Filled 2018-02-01: qty 2

## 2018-02-01 MED ORDER — PANTOPRAZOLE SODIUM 40 MG PO TBEC
40.0000 mg | DELAYED_RELEASE_TABLET | Freq: Every day | ORAL | Status: DC
  Administered 2018-02-02 – 2018-02-05 (×4): 40 mg via ORAL
  Filled 2018-02-01 (×4): qty 1

## 2018-02-01 MED ORDER — HYDROCODONE-ACETAMINOPHEN 5-325 MG PO TABS
1.0000 | ORAL_TABLET | ORAL | Status: DC | PRN
  Administered 2018-02-01 – 2018-02-02 (×2): 2 via ORAL
  Administered 2018-02-02: 1 via ORAL
  Administered 2018-02-02: 2 via ORAL
  Filled 2018-02-01 (×4): qty 2
  Filled 2018-02-01: qty 1

## 2018-02-01 MED ORDER — LIDOCAINE HCL (PF) 2 % IJ SOLN
INTRAMUSCULAR | Status: AC
  Filled 2018-02-01: qty 10

## 2018-02-01 MED ORDER — FENTANYL CITRATE (PF) 100 MCG/2ML IJ SOLN
INTRAMUSCULAR | Status: AC
  Administered 2018-02-01: 25 ug via INTRAVENOUS
  Filled 2018-02-01: qty 2

## 2018-02-01 MED ORDER — PROMETHAZINE HCL 25 MG/ML IJ SOLN
INTRAMUSCULAR | Status: AC
  Administered 2018-02-01: 6.25 mg via INTRAVENOUS
  Filled 2018-02-01: qty 1

## 2018-02-01 MED ORDER — PROMETHAZINE HCL 25 MG/ML IJ SOLN
25.0000 mg | Freq: Four times a day (QID) | INTRAMUSCULAR | Status: DC | PRN
  Administered 2018-02-01: 25 mg via INTRAVENOUS
  Filled 2018-02-01: qty 1

## 2018-02-01 MED ORDER — ALUM & MAG HYDROXIDE-SIMETH 200-200-20 MG/5ML PO SUSP: 30.0000 mL | ORAL | Status: DC | PRN

## 2018-02-01 MED ORDER — PANTOPRAZOLE SODIUM 40 MG PO TBEC: 40.0000 mg | DELAYED_RELEASE_TABLET | Freq: Every day | ORAL | Status: DC

## 2018-02-01 MED ORDER — ONDANSETRON HCL 4 MG/2ML IJ SOLN
INTRAMUSCULAR | Status: AC
  Administered 2018-02-01: 4 mg via INTRAVENOUS
  Filled 2018-02-01: qty 2

## 2018-02-01 MED ORDER — ACETAMINOPHEN NICU IV SYRINGE 10 MG/ML
INTRAVENOUS | Status: AC
  Filled 2018-02-01: qty 1

## 2018-02-01 MED ORDER — ONDANSETRON HCL 4 MG/2ML IJ SOLN
4.0000 mg | Freq: Four times a day (QID) | INTRAMUSCULAR | Status: DC | PRN
  Administered 2018-02-01 – 2018-02-04 (×4): 4 mg via INTRAVENOUS
  Filled 2018-02-01 (×3): qty 2

## 2018-02-01 MED ORDER — PROPOFOL 10 MG/ML IV BOLUS
INTRAVENOUS | Status: DC | PRN
  Administered 2018-02-01: 130 mg via INTRAVENOUS

## 2018-02-01 MED ORDER — FENTANYL CITRATE (PF) 100 MCG/2ML IJ SOLN
25.0000 ug | INTRAMUSCULAR | Status: DC | PRN
  Administered 2018-02-01 (×5): 25 ug via INTRAVENOUS

## 2018-02-01 MED ORDER — KETOROLAC TROMETHAMINE 30 MG/ML IJ SOLN
INTRAMUSCULAR | Status: AC
  Filled 2018-02-01: qty 1

## 2018-02-01 MED ORDER — MENTHOL 3 MG MT LOZG
1.0000 | LOZENGE | OROMUCOSAL | Status: DC | PRN
  Filled 2018-02-01: qty 9

## 2018-02-01 MED ORDER — GLYCOPYRROLATE 0.2 MG/ML IJ SOLN
INTRAMUSCULAR | Status: DC | PRN
  Administered 2018-02-01: .15 mg via INTRAVENOUS

## 2018-02-01 MED ORDER — MAGNESIUM CITRATE PO SOLN
1.0000 | Freq: Once | ORAL | Status: DC | PRN
  Filled 2018-02-01: qty 296

## 2018-02-01 MED ORDER — SENNOSIDES-DOCUSATE SODIUM 8.6-50 MG PO TABS
1.0000 | ORAL_TABLET | Freq: Every evening | ORAL | Status: DC | PRN
  Administered 2018-02-04: 1 via ORAL
  Filled 2018-02-01: qty 1

## 2018-02-01 MED ORDER — PROMETHAZINE HCL 25 MG/ML IJ SOLN
6.2500 mg | INTRAMUSCULAR | Status: DC
  Administered 2018-02-01 (×2): 6.25 mg via INTRAVENOUS

## 2018-02-01 MED ORDER — FENTANYL CITRATE (PF) 100 MCG/2ML IJ SOLN
INTRAMUSCULAR | Status: DC | PRN
  Administered 2018-02-01 (×4): 50 ug via INTRAVENOUS

## 2018-02-01 MED ORDER — BISACODYL 10 MG RE SUPP: 10.0000 mg | Freq: Every day | RECTAL | Status: DC | PRN

## 2018-02-01 MED ORDER — ONDANSETRON HCL 4 MG/2ML IJ SOLN
INTRAMUSCULAR | Status: AC
  Filled 2018-02-01: qty 2

## 2018-02-01 MED ORDER — ESTROGENS, CONJUGATED 0.625 MG/GM VA CREA
TOPICAL_CREAM | VAGINAL | Status: AC
  Filled 2018-02-01: qty 30

## 2018-02-01 MED ORDER — DEXAMETHASONE SODIUM PHOSPHATE 10 MG/ML IJ SOLN
INTRAMUSCULAR | Status: DC | PRN
  Administered 2018-02-01: 10 mg via INTRAVENOUS

## 2018-02-01 SURGICAL SUPPLY — 34 items
BAG COUNTER SPONGE EZ (MISCELLANEOUS) ×2 IMPLANT
BAG URINE DRAINAGE (UROLOGICAL SUPPLIES) ×3 IMPLANT
CANISTER SUCT 1200ML W/VALVE (MISCELLANEOUS) ×3 IMPLANT
CATH FOLEY 2WAY  5CC 16FR (CATHETERS) ×2
CATH URTH 16FR FL 2W BLN LF (CATHETERS) ×1 IMPLANT
COUNTER SPONGE BAG EZ (MISCELLANEOUS) ×1
DRAPE PERI LITHO V/GYN (MISCELLANEOUS) ×3 IMPLANT
DRAPE SHEET LG 3/4 BI-LAMINATE (DRAPES) ×3 IMPLANT
ELECT CAUTERY BLADE 6.4 (BLADE) ×3 IMPLANT
ELECT REM PT RETURN 9FT ADLT (ELECTROSURGICAL) ×3
ELECTRODE REM PT RTRN 9FT ADLT (ELECTROSURGICAL) ×1 IMPLANT
GAUZE PACK 2X3YD (MISCELLANEOUS) ×3 IMPLANT
GLOVE BIO SURGEON STRL SZ 6.5 (GLOVE) ×2 IMPLANT
GLOVE BIO SURGEONS STRL SZ 6.5 (GLOVE) ×1
GLOVE INDICATOR 7.0 STRL GRN (GLOVE) ×3 IMPLANT
GOWN STRL REUS W/ TWL LRG LVL3 (GOWN DISPOSABLE) ×2 IMPLANT
GOWN STRL REUS W/TWL LRG LVL3 (GOWN DISPOSABLE) ×4
KIT TURNOVER CYSTO (KITS) ×3 IMPLANT
LABEL OR SOLS (LABEL) ×3 IMPLANT
NEEDLE HYPO 25GX1 SAFETY (NEEDLE) ×3 IMPLANT
NS IRRIG 500ML POUR BTL (IV SOLUTION) ×3 IMPLANT
PACK BASIN MINOR ARMC (MISCELLANEOUS) ×3 IMPLANT
PAD OB MATERNITY 4.3X12.25 (PERSONAL CARE ITEMS) ×3 IMPLANT
PAD PREP 24X41 OB/GYN DISP (PERSONAL CARE ITEMS) ×3 IMPLANT
SOL PREP PVP 2OZ (MISCELLANEOUS) ×3
SOLUTION PREP PVP 2OZ (MISCELLANEOUS) ×1 IMPLANT
SUT CHROMIC 0 CT 1 (SUTURE) ×3 IMPLANT
SUT CHROMIC 1-0 (SUTURE) ×3 IMPLANT
SUT CHROMIC 2 0 CT 1 (SUTURE) ×3 IMPLANT
SUT VIC AB 0 CT1 27 (SUTURE) ×8
SUT VIC AB 0 CT1 27XCR 8 STRN (SUTURE) ×4 IMPLANT
SUT VIC AB 0 CT1 36 (SUTURE) ×3 IMPLANT
SYR 10ML LL (SYRINGE) ×3 IMPLANT
SYR CONTROL 10ML (SYRINGE) ×3 IMPLANT

## 2018-02-01 NOTE — Discharge Instructions (Signed)
Vaginal Hysterectomy, Care After °Refer to this sheet in the next few weeks. These instructions provide you with information about caring for yourself after your procedure. Your health care provider may also give you more specific instructions. Your treatment has been planned according to current medical practices, but problems sometimes occur. Call your health care provider if you have any problems or questions after your procedure. °What can I expect after the procedure? °After the procedure, it is common to have: °· Pain. °· Soreness and numbness in your incision areas. °· Vaginal bleeding and discharge. °· Constipation. °· Temporary problems emptying the bladder. °· Feelings of sadness or other emotions. ° °Follow these instructions at home: °Medicines °· Take over-the-counter and prescription medicines only as told by your health care provider. °· If you were prescribed an antibiotic medicine, take it as told by your health care provider. Do not stop taking the antibiotic even if you start to feel better. °· Do not drive or operate heavy machinery while taking prescription pain medicine. °Activity °· Return to your normal activities as told by your health care provider. Ask your health care provider what activities are safe for you. °· Get regular exercise as told by your health care provider. You may be told to take short walks every day and go farther each time. °· Do not lift anything that is heavier than 10 lb (4.5 kg). °General instructions ° °· Do not put anything in your vagina for 6 weeks after your surgery or as told by your health care provider. This includes tampons and douches. °· Do not have sex until your health care provider says you can. °· Do not take baths, swim, or use a hot tub until your health care provider approves. °· Drink enough fluid to keep your urine clear or pale yellow. °· Do not drive for 24 hours if you were given a sedative. °· Keep all follow-up visits as told by your health  care provider. This is important. °Contact a health care provider if: °· Your pain medicine is not helping. °· You have a fever. °· You have redness, swelling, or pain at your incision site. °· You have blood, pus, or a bad-smelling discharge from your vagina. °· You continue to have difficulty urinating. °Get help right away if: °· You have severe abdominal or back pain. °· You have heavy bleeding from your vagina. °· You have chest pain or shortness of breath. °This information is not intended to replace advice given to you by your health care provider. Make sure you discuss any questions you have with your health care provider. °Document Released: 08/13/2015 Document Revised: 09/27/2015 Document Reviewed: 05/06/2015 °Elsevier Interactive Patient Education © 2018 [employer] ° °

## 2018-02-01 NOTE — Anesthesia Postprocedure Evaluation (Signed)
Anesthesia Post Note  Patient: KORIANNA WASHER  Procedure(s) Performed: HYSTERECTOMY VAGINAL WITH BILATERAL SALPINGECTOMY (Bilateral )  Patient location during evaluation: PACU Anesthesia Type: General Level of consciousness: awake and alert and oriented Pain management: pain level controlled Vital Signs Assessment: post-procedure vital signs reviewed and stable Respiratory status: spontaneous breathing Cardiovascular status: blood pressure returned to baseline Anesthetic complications: no     Last Vitals:  Vitals:   02/01/18 1035 02/01/18 1113  BP: 123/76 120/83  Pulse: 70 69  Resp: 18 18  Temp: 36.9 C 36.9 C  SpO2: 98% 97%    Last Pain:  Vitals:   02/01/18 1502  TempSrc:   PainSc: 5                  Kiannah Grunow

## 2018-02-01 NOTE — Op Note (Signed)
Procedure(s): HYSTERECTOMY VAGINAL WITH BILATERAL SALPINGECTOMY Procedure Note  PROMISS LABARBERA female 27 y.o. 02/01/2018  Indications: The patient is a 27 y.o. [id number] female with pelvic pain, dyspareunia, dysmenorrhea and menorrhagia with regular cycle.  She has a past surgical history of bilateral tubal ligation.   Pre-operative Diagnosis: pelvic pain, dyspareunia, dysmenorrhea and menorrhagia with regular cycle  Post-operative Diagnosis: Same, with right tubal cyst  Surgeon: Hildred Laser, MD  Assistants:  Sharon Seller, MD. No other capable assistant available in surgery requiring a high level assistant.   Anesthesia: General endotracheal anesthesia  Procedure Details: The patient was seen in the Holding Room. The risks, benefits, complications, treatment options, and expected outcomes were discussed with the patient.  The patient concurred with the proposed plan, giving informed consent.  The site of surgery properly noted/marked. The patient was taken to the Operating Room, identified as Melissa Mcmahon and the procedure verified as Procedure(s) (LRB): HYSTERECTOMY VAGINAL WITH BILATERAL SALPINGECTOMY (Bilateral). A Time Out was held and the above information confirmed.  After induction of anesthesia, the patient was draped and prepped in the usual sterile manner. She was placed in dosal lithotomy position using Allen stirrups after anesthesia and draped and prepped in the usual sterile manner.  A foley catheter was placed.  A weighted speculum was placed into the vagina and the cervix was grasped with a double-toothed tenaculum.  The cervix was then circumferentially injected Vasopressin (20 cc of epinephrine in 50 ml of saline).  The cervix was then circumferentially incised with the scalpel. The posterior cul-de sac was then entered sharply with the Mayo scissors without difficulty.  The same procedure was performed anteriorly, and the bladder dissected of the pubovesical  cervical fascia.    At this point, a Heaney clamp was placed over the uterosacaral ligaments on either side.  These were then transected and suture ligated with #0 Vicryl.  Hemostasis was assured.  The cardinal ligaments were then clamped on both sides, transected and suture ligated in a similar fashion.    The uterine arteries and the broad ligament were then serially clamped with Heaney clamps, transected and suture ligated on both sides.  Excellent hemostasis was visualized.  Both cornua were clamped with Heaney clamps, transected and the uterus was delivered.  These pedicles were then suture ligated with excellent hemostasis.   Babcock clamps were used to grasp the left and right fallopian tubes, and they were transected. Curved Heaney clamps were placed across the mesosalpinx ligaments bilaterally and curved scissors were used to excise the specimen from the Heaney clamp. The pedicles were ligated bilaterally with 0-Vicryl and hemostasis noted to be achieved. No other abnormalities were noted in the pelvic cavity. After this, the posterior vaginal cuff was reperitonealized using a running locked suture of 0-chromic suture, incorporating the uterosacral ligaments.   The peritoneum was reperitonealized using a suture of 0-Vicryl in a purse-string fashion. The vaginal cuff angles were closed with figure-of-eight stitches of #0 Vicryl on both sides and transfixed to the ipsilateral cardinal and uterosacral ligaments. The remainder of the vaginal cuff was closed with figure-of-eight stitches of #0 Vicryl.   All instruments were then removed from the vagina.  The patient was then taken out of dorsal lithotomy position and awakened from general anesthesia.  The patient was taken to the PACU in stable condition.  Sponge, lap, needle, and instrument count was correct times two.     Findings: The uterus appeared normal. Fallopian tubes and ovaries appeared normal with the  exception of a benign right  tubal cyst.   Estimated Blood Loss:  25 ml      Drains: straight catheterization prior to procedure with  50 ml of clear urine         Total IV Fluids:  700 ml  Specimens: Uterus with cervix and bilateral fallopian tubes         Implants: None         Complications:  None; patient tolerated the procedure well.         Disposition: PACU - hemodynamically stable.         Condition: stable   Hildred Laser, MD [facility]

## 2018-02-01 NOTE — Transfer of Care (Signed)
Immediate Anesthesia Transfer of Care Note  Patient: Melissa Mcmahon  Procedure(s) Performed: HYSTERECTOMY VAGINAL WITH BILATERAL SALPINGECTOMY (Bilateral )  Patient Location: PACU  Anesthesia Type:General  Level of Consciousness: awake, alert  and oriented  Airway & Oxygen Therapy: Patient Spontanous Breathing and Patient connected to face mask oxygen  Post-op Assessment: Report given to RN and Post -op Vital signs reviewed and stable  Post vital signs: Reviewed and stable  Last Vitals:  Vitals Value Taken Time  BP 111/75 02/01/2018  9:05 AM  Temp    Pulse 77 02/01/2018  9:04 AM  Resp 27 02/01/2018  9:04 AM  SpO2 100 % 02/01/2018  9:04 AM  Vitals shown include unvalidated device data.  Last Pain:  Vitals:   02/01/18 0617  TempSrc: Tympanic  PainSc: 0-No pain         Complications: No apparent anesthesia complications

## 2018-02-01 NOTE — Interval H&P Note (Signed)
History and Physical Interval Note:  02/01/2018 7:23 AM  Melissa Mcmahon  has presented today for surgery, with the diagnosis of PELVIC PAIN, MENORRHAGIA, DYSMENORRHEA, DYSPAREUNIA  The various methods of treatment have been discussed with the patient and family. After consideration of risks, benefits and other options for treatment, the patient has consented to  Procedure(s): HYSTERECTOMY VAGINAL WITH BILATERAL SALPINGECTOMY (Bilateral) as a surgical intervention .  The patient's history has been reviewed, patient examined, no change in status, stable for surgery.  I have reviewed the patient's chart and labs.  Questions were answered to the patient's satisfaction.     Hildred Laser, MD [facility]

## 2018-02-01 NOTE — Anesthesia Procedure Notes (Signed)
Procedure Name: Intubation Performed by: Taron Mondor, CRNA Pre-anesthesia Checklist: Patient identified, Patient being monitored, Timeout performed, Emergency Drugs available and Suction available Patient Re-evaluated:Patient Re-evaluated prior to induction Oxygen Delivery Method: Circle system utilized Preoxygenation: Pre-oxygenation with 100% oxygen Induction Type: IV induction Ventilation: Mask ventilation without difficulty Laryngoscope Size: Mac and 3 Grade View: Grade I Tube type: Oral Tube size: 7.0 mm Number of attempts: 1 Airway Equipment and Method: Stylet Placement Confirmation: ETT inserted through vocal cords under direct vision,  positive ETCO2 and breath sounds checked- equal and bilateral Secured at: 21 cm Tube secured with: Tape Dental Injury: Teeth and Oropharynx as per pre-operative assessment        

## 2018-02-01 NOTE — Anesthesia Post-op Follow-up Note (Signed)
Anesthesia QCDR form completed.        

## 2018-02-01 NOTE — Anesthesia Preprocedure Evaluation (Addendum)
Anesthesia Evaluation  Patient identified by MRN, date of birth, ID band Patient awake    Reviewed: Allergy & Precautions, H&P , NPO status , Patient's Chart, lab work & pertinent test results, reviewed documented beta blocker date and time   Airway Mallampati: II  TM Distance: >3 FB Neck ROM: full    Dental  (+) Teeth Intact, Chipped Bilateral upper incisors with serrated appearance and chips. :   Pulmonary neg pulmonary ROS, asthma , Current Smoker, former smoker,    Pulmonary exam normal        Cardiovascular negative cardio ROS Normal cardiovascular exam Rhythm:regular Rate:Normal     Neuro/Psych  Headaches, negative neurological ROS  negative psych ROS   GI/Hepatic negative GI ROS, Neg liver ROS, hiatal hernia, GERD  Medicated and Controlled,  Endo/Other  negative endocrine ROS  Renal/GU negative Renal ROS  Female GU complaint     Musculoskeletal   Abdominal   Peds  Hematology negative hematology ROS (+)   Anesthesia Other Findings   Reproductive/Obstetrics negative OB ROS                            Anesthesia Physical  Anesthesia Plan  ASA: II  Anesthesia Plan: General   Post-op Pain Management:    Induction: Intravenous  PONV Risk Score and Plan:   Airway Management Planned: Oral ETT  Additional Equipment:   Intra-op Plan:   Post-operative Plan: Extubation in OR  Informed Consent: I have reviewed the patients History and Physical, chart, labs and discussed the procedure including the risks, benefits and alternatives for the proposed anesthesia with the patient or authorized representative who has indicated his/her understanding and acceptance.     Plan Discussed with: CRNA  Anesthesia Plan Comments:         Anesthesia Quick Evaluation

## 2018-02-02 ENCOUNTER — Observation Stay: Payer: Commercial Managed Care - PPO

## 2018-02-02 DIAGNOSIS — N1330 Unspecified hydronephrosis: Secondary | ICD-10-CM | POA: Diagnosis present

## 2018-02-02 DIAGNOSIS — Z9851 Tubal ligation status: Secondary | ICD-10-CM | POA: Diagnosis not present

## 2018-02-02 DIAGNOSIS — Z9049 Acquired absence of other specified parts of digestive tract: Secondary | ICD-10-CM | POA: Diagnosis not present

## 2018-02-02 DIAGNOSIS — R102 Pelvic and perineal pain: Secondary | ICD-10-CM | POA: Diagnosis present

## 2018-02-02 DIAGNOSIS — R7989 Other specified abnormal findings of blood chemistry: Secondary | ICD-10-CM | POA: Diagnosis present

## 2018-02-02 DIAGNOSIS — N135 Crossing vessel and stricture of ureter without hydronephrosis: Secondary | ICD-10-CM | POA: Diagnosis not present

## 2018-02-02 DIAGNOSIS — N946 Dysmenorrhea, unspecified: Secondary | ICD-10-CM | POA: Diagnosis present

## 2018-02-02 DIAGNOSIS — N941 Unspecified dyspareunia: Secondary | ICD-10-CM | POA: Diagnosis present

## 2018-02-02 DIAGNOSIS — K219 Gastro-esophageal reflux disease without esophagitis: Secondary | ICD-10-CM | POA: Diagnosis present

## 2018-02-02 DIAGNOSIS — N838 Other noninflammatory disorders of ovary, fallopian tube and broad ligament: Secondary | ICD-10-CM | POA: Diagnosis present

## 2018-02-02 DIAGNOSIS — Z880 Allergy status to penicillin: Secondary | ICD-10-CM | POA: Diagnosis not present

## 2018-02-02 DIAGNOSIS — G8929 Other chronic pain: Secondary | ICD-10-CM | POA: Diagnosis present

## 2018-02-02 DIAGNOSIS — N920 Excessive and frequent menstruation with regular cycle: Secondary | ICD-10-CM | POA: Diagnosis present

## 2018-02-02 DIAGNOSIS — Z87891 Personal history of nicotine dependence: Secondary | ICD-10-CM | POA: Diagnosis not present

## 2018-02-02 DIAGNOSIS — Z90710 Acquired absence of both cervix and uterus: Secondary | ICD-10-CM | POA: Diagnosis present

## 2018-02-02 LAB — RPR: RPR: NONREACTIVE

## 2018-02-02 LAB — CBC
HEMATOCRIT: 33.3 % — AB (ref 35.0–47.0)
HEMOGLOBIN: 11.8 g/dL — AB (ref 12.0–16.0)
MCH: 31.6 pg (ref 26.0–34.0)
MCHC: 35.3 g/dL (ref 32.0–36.0)
MCV: 89.4 fL (ref 80.0–100.0)
Platelets: 200 10*3/uL (ref 150–440)
RBC: 3.73 MIL/uL — ABNORMAL LOW (ref 3.80–5.20)
RDW: 13.6 % (ref 11.5–14.5)
WBC: 11.3 10*3/uL — AB (ref 3.6–11.0)

## 2018-02-02 LAB — CREATININE, SERUM
CREATININE: 1.41 mg/dL — AB (ref 0.44–1.00)
Creatinine, Ser: 1.27 mg/dL — ABNORMAL HIGH (ref 0.44–1.00)
GFR calc non Af Amer: 50 mL/min — ABNORMAL LOW (ref >60)
GFR, EST AFRICAN AMERICAN: 58 mL/min — AB (ref >60)
GFR, EST NON AFRICAN AMERICAN: 57 mL/min — AB (ref >60)

## 2018-02-02 LAB — BASIC METABOLIC PANEL
Anion gap: 4 — ABNORMAL LOW (ref 5–15)
BUN: 19 mg/dL (ref 6–20)
CALCIUM: 8.5 mg/dL — AB (ref 8.9–10.3)
CO2: 28 mmol/L (ref 22–32)
CREATININE: 1.43 mg/dL — AB (ref 0.44–1.00)
Chloride: 106 mmol/L (ref 98–111)
GFR calc Af Amer: 57 mL/min — ABNORMAL LOW (ref >60)
GFR, EST NON AFRICAN AMERICAN: 49 mL/min — AB (ref >60)
GLUCOSE: 86 mg/dL (ref 70–99)
Potassium: 4.2 mmol/L (ref 3.5–5.1)
Sodium: 138 mmol/L (ref 135–145)

## 2018-02-02 MED ORDER — OXYCODONE-ACETAMINOPHEN 5-325 MG PO TABS
1.0000 | ORAL_TABLET | ORAL | Status: DC | PRN
  Administered 2018-02-02 – 2018-02-03 (×7): 2 via ORAL
  Administered 2018-02-04 (×2): 1 via ORAL
  Administered 2018-02-04 (×2): 2 via ORAL
  Filled 2018-02-02 (×8): qty 2
  Filled 2018-02-02: qty 1
  Filled 2018-02-02: qty 2

## 2018-02-02 NOTE — Progress Notes (Signed)
Ultrasound performed, reviewed. Mild hydronephrosis noted on left side. Normal electrolytes noted on BMP.  Creatinine increasing. Notified Urology of findings, recommend CT urogram in a.m., and will see patient as she is still relatively stable, afebrile, and mild findings on ultrasound.    Lab Results  Component Value Date   CREATININE 1.43 (H) 02/02/2018   BUN 19 02/02/2018   NA 138 02/02/2018   K 4.2 02/02/2018   CL 106 02/02/2018   CO2 28 02/02/2018     Lab Results  Component Value Date   CREATININE 1.43 (H) 02/02/2018   CREATININE 1.41 (H) 02/02/2018   CREATININE 1.27 (H) 02/02/2018    US RENAL CLINICAL DATA:  Elevated serum creatinine  EXAM: RENAL / URINARY TRACT ULTRASOUND COMPLETE  COMPARISON:  CT abdomen pelvis November 15, 2016  FINDINGS: Right Kidney:  Length: 12.3 cm. Echogenicity within normal limits. No mass or hydronephrosis visualized.  Left Kidney:  Length: 12.5 cm. Echogenicity within normal limits. No hydronephrosis visualized. There is mild left hydronephrosis.  Bladder:  The bladder is partially decompressed limiting evaluation.  IMPRESSION: Mild left hydronephrosis.  Electronically Signed   By: Sherian Rein M.D.   On: 02/02/2018 18:35    Hildred Laser, MD [facility]

## 2018-02-02 NOTE — Progress Notes (Addendum)
Patient with elevated creatinine levels s/p surgery, repeat shows continued rise.  Will order renal ultrasound to assess for hydronephrosis, signs of ureteric injury or obstruction. Patient has been continuing to void without difficulty, has adequate urine output. Has been able to tolerate a regular diet, post-operative vomiting has mostly subsided. Discussed findings with patient.  Also she notes that Norco does not control her pain.  Will change to Percocet.   Labs:  Lab Results  Component Value Date   CREATININE 1.41 (H) 02/02/2018   CREATININE 1.27 (H) 02/02/2018   CREATININE 0.72 02/01/2018    Hildred Laser, MD [facility]

## 2018-02-02 NOTE — Progress Notes (Signed)
Post-Operative Day # 1, s/p TVH with bilateral salpingectomy. PMH includes dysmenorrhea, dyspareunia, menorrhagia.   Subjective: Patient complains of vomiting for good portion of the day yesterday, has only recently been able to tolerate crackers and water late into the night. Notes her pain is becoming better controlled.  Has ambulated to the restroom. Denies difficulty voiding. Reports passage of flatus but no BM. Denies fevers, chills, SOB, chest pain.   Objective: Temp:  [97.7 F (36.5 C)-99.1 F (37.3 C)] 98.5 F (36.9 C) (10/01 0741) Pulse Rate:  [56-102] 68 (10/01 0741) Resp:  [18-24] 20 (10/01 0741) BP: (111-140)/(75-99) 120/82 (10/01 0741) SpO2:  [97 %-100 %] 99 % (10/01 0741) Weight:  [64.9 kg] 64.9 kg (09/30 1113)  Physical Exam:  General: alert and no distress  Lungs: clear to auscultation bilaterally Heart: regular rate and rhythm, S1, S2 normal, no murmur, click, rub or gallop Abdomen: soft, non-tender; bowel sounds normal; no masses,  no organomegaly Pelvis:Bleeding: scant,  Incision: N/A Extremities: DVT Evaluation: No evidence of DVT seen on physical exam. Negative Homan's sign. No cords or calf tenderness. No significant calf/ankle edema.  Labs:  CBC Latest Ref Rng & Units 02/02/2018 01/23/2018  WBC 3.6 - 11.0 K/uL 11.3(H) 7.2  Hemoglobin 12.0 - 16.0 g/dL 11.8(L) 13.4  Hematocrit 35.0 - 47.0 % 33.3(L) 39.2  Platelets 150 - 440 K/uL 200 265     Lab Results  Component Value Date   CREATININE 1.27 (H) 02/02/2018   CREATININE 0.72 02/01/2018   CREATININE 0.63 01/23/2018    Assessment: s/p Procedure(s): HYSTERECTOMY VAGINAL WITH BILATERAL SALPINGECTOMY (Bilateral): stable and partially tolerating diet Elevated Creatinine  Plan: Advance diet slowly, as tolerated.  Encourage ambulation Advance to PO medication only, once tolerating more foods Discontinue IV fluids once tolerating regular diet.  Will repeat Creatinine as significantly elevated post-op. Unsure  if due to dehydration from excessive post-op nausea, or more concerning for ureteric injury (however patient with no other evidence, as she denies hematuria, flank pain, or other urinary symptoms).  If repeat remains elevated after patient tolerating more fluids and food, may need to further assess for injury of urinary tract.  Can d/c home later today if all labs normal and patient tolerating diet.    LOS: 1 day     Hildred Laser, MD [facility]

## 2018-02-03 ENCOUNTER — Encounter: Payer: Self-pay | Admitting: Radiology

## 2018-02-03 ENCOUNTER — Inpatient Hospital Stay: Payer: Commercial Managed Care - PPO

## 2018-02-03 DIAGNOSIS — R7989 Other specified abnormal findings of blood chemistry: Secondary | ICD-10-CM

## 2018-02-03 DIAGNOSIS — N135 Crossing vessel and stricture of ureter without hydronephrosis: Secondary | ICD-10-CM

## 2018-02-03 LAB — BASIC METABOLIC PANEL
ANION GAP: 5 (ref 5–15)
BUN: 20 mg/dL (ref 6–20)
CO2: 28 mmol/L (ref 22–32)
Calcium: 8.7 mg/dL — ABNORMAL LOW (ref 8.9–10.3)
Chloride: 107 mmol/L (ref 98–111)
Creatinine, Ser: 1.48 mg/dL — ABNORMAL HIGH (ref 0.44–1.00)
GFR calc Af Amer: 55 mL/min — ABNORMAL LOW (ref >60)
GFR calc non Af Amer: 47 mL/min — ABNORMAL LOW (ref >60)
GLUCOSE: 92 mg/dL (ref 70–99)
Potassium: 4.4 mmol/L (ref 3.5–5.1)
Sodium: 140 mmol/L (ref 135–145)

## 2018-02-03 LAB — SURGICAL PATHOLOGY

## 2018-02-03 MED ORDER — CIPROFLOXACIN IN D5W 400 MG/200ML IV SOLN
400.0000 mg | Freq: Once | INTRAVENOUS | Status: AC
  Administered 2018-02-04: 400 mg via INTRAVENOUS
  Filled 2018-02-03: qty 200

## 2018-02-03 MED ORDER — IOPAMIDOL (ISOVUE-300) INJECTION 61%
100.0000 mL | Freq: Once | INTRAVENOUS | Status: AC | PRN
  Administered 2018-02-03: 100 mL via INTRAVENOUS

## 2018-02-03 NOTE — Progress Notes (Signed)
Urology Consult Follow Up  CT performed this morning does show moderate left hydronephrosis and hydroureter to at least the mid ureter.  Findings were discussed with Melissa Mcmahon.  I have recommended cystoscopy with left retrograde pyelogram and possible left ureteral stent placement.  The procedure was discussed in detail.  The potential need for placement of a percutaneous nephrostomy tube was discussed.  With the potential risks of bleeding and infection were discussed.  She indicated all questions were answered and desires to proceed.  Will schedule as an add on tomorrow morning.  Melissa Mcmahon [facility] 02/03/2018

## 2018-02-03 NOTE — Anesthesia Preprocedure Evaluation (Addendum)
Anesthesia Evaluation    History of Anesthesia Complications (+) PONV and history of anesthetic complications  Airway Mallampati: II       Dental  (+) Dental Advidsory Given, Teeth Intact   Pulmonary neg shortness of breath, asthma , neg COPD, neg recent URI, former smoker,           Cardiovascular Exercise Tolerance: Good negative cardio ROS       Neuro/Psych  Headaches, neg Seizures negative psych ROS   GI/Hepatic Neg liver ROS, hiatal hernia, GERD  ,  Endo/Other  negative endocrine ROS  Renal/GU Renal diseaseKidney stone     Musculoskeletal   Abdominal   Peds  Hematology negative hematology ROS (+)   Anesthesia Other Findings Past Medical History: No date: Acid reflux No date: Asthma     Comment:  H/O AS A CHILD-NO INHALERS  No date: Endometriosis No date: Headache     Comment:  MIGRAINES-RARE No date: History of hiatal hernia   Reproductive/Obstetrics negative OB ROS                            Anesthesia Physical Anesthesia Plan  ASA: II  Anesthesia Plan: General   Post-op Pain Management:    Induction: Intravenous  PONV Risk Score and Plan: 4 or greater and Ondansetron, Dexamethasone, Midazolam, Promethazine, Treatment may vary due to age or medical condition and Scopolamine patch - Pre-op  Airway Management Planned: Oral ETT and LMA  Additional Equipment:   Intra-op Plan:   Post-operative Plan: Extubation in OR  Informed Consent:   Plan Discussed with:   Anesthesia Plan Comments:        Anesthesia Quick Evaluation

## 2018-02-03 NOTE — H&P (View-Only) (Signed)
   Urology Consult  I have been asked to see the patient by Dr. Cherry, for evaluation and management of left hydronephrosis.  Chief Complaint: N/A  History of Present Illness: Melissa Mcmahon is a 27 y.o. year old female status post an uneventful vaginal hysterectomy on 02/01/2018.  She had significant postoperative nausea and vomiting.  On postoperative day #1 her nausea and vomiting resolved.  A creatinine had bumped from 0.72 to 1.27.  This was repeated and was 1.4.  A renal ultrasound was performed which showed mild left hydronephrosis.  She denies flank or abdominal pain.  She has no voiding complaints and denies gross hematuria.  Her contralateral kidney was normal in appearance.  There is no previous history of urologic problems.  Past Medical History:  Diagnosis Date  . Acid reflux   . Asthma    H/O AS A CHILD-NO INHALERS   . Endometriosis   . Headache    MIGRAINES-RARE  . History of hiatal hernia     Past Surgical History:  Procedure Laterality Date  . CHOLECYSTECTOMY N/A 10/07/2017   Procedure: LAPAROSCOPIC CHOLECYSTECTOMY;  Surgeon: Davis, Jason Evan, MD;  Location: [facility] ORS;  Service: General;  Laterality: N/A;  . FEET SURGERY    . KNEE ARTHROSCOPY    . LAPAROSCOPY N/A 02/08/2015   Procedure: LAPAROSCOPY DIAGNOSTIC;  Surgeon: Andreas Staebler, MD;  Location: [facility] ORS;  Service: Gynecology;  Laterality: N/A;  . TUBAL LIGATION    . VAGINAL HYSTERECTOMY Bilateral 02/01/2018   Procedure: HYSTERECTOMY VAGINAL WITH BILATERAL SALPINGECTOMY;  Surgeon: Cherry, Anika, MD;  Location: [facility] ORS;  Service: Gynecology;  Laterality: Bilateral;    Home Medications:  Current Meds  Medication Sig  . omeprazole (PRILOSEC) 20 MG capsule Take 20 mg by mouth every morning.     Allergies:  Allergies  Allergen Reactions  . Penicillins Hives    Has patient had a PCN reaction causing immediate rash, facial/tongue/throat swelling, SOB or lightheadedness with hypotension: unkn Has patient  had a PCN reaction causing severe rash involving mucus membranes or skin necrosis: no Has patient had a PCN reaction that required hospitalization: no Has patient had a PCN reaction occurring within the last 10 years: no If all of the above answers are "NO", then may proceed with Cephalosporin use.     Family History  Problem Relation Age of Onset  . Diabetes Father   . Diabetes Maternal Aunt   . Breast cancer Neg Hx   . Ovarian cancer Neg Hx   . Colon cancer Neg Hx     Social History:  reports that she quit smoking about 7 months ago. Her smoking use included e-cigarettes and cigarettes. She has a 7.00 pack-year smoking history. She uses smokeless tobacco. She reports that she does not drink alcohol or use drugs.  ROS: A complete review of systems was performed.  All systems are negative except for pertinent findings as noted.  Physical Exam:  Vital signs in last 24 hours: Temp:  [98 F (36.7 C)-98.5 F (36.9 C)] 98.5 F (36.9 C) (10/01 2323) Pulse Rate:  [54-65] 65 (10/01 2323) Resp:  [18-20] 18 (10/01 2323) BP: (119-125)/(81-93) 121/92 (10/01 2323) SpO2:  [98 %-100 %] 100 % (10/01 2323) Constitutional:  Alert and oriented, No acute distress HEENT: [place] AT, moist mucus membranes.  Trachea midline, no masses Cardiovascular: Regular rate and rhythm, no clubbing, cyanosis, or edema. Respiratory: Normal respiratory effort, lungs clear bilaterally GI: Abdomen is soft, nontender, nondistended, no abdominal masses GU: No   CVA tenderness Skin: No rashes, bruises or suspicious lesions Lymph: No cervical or inguinal adenopathy Neurologic: Grossly intact, no focal deficits, moving all 4 extremities Psychiatric: Normal mood and affect   Laboratory Data:  Recent Labs    02/02/18 0536  WBC 11.3*  HGB 11.8*  HCT 33.3*   Recent Labs    02/02/18 1308 02/02/18 1746 02/03/18 0507  NA  --  138 140  K  --  4.2 4.4  CL  --  106 107  CO2  --  28 28  GLUCOSE  --  86 92  BUN  --   19 20  CREATININE 1.41* 1.43* 1.48*  CALCIUM  --  8.5* 8.7*   No results for input(s): LABPT, INR in the last 72 hours. No results for input(s): LABURIN in the last 72 hours. Results for orders placed or performed during the hospital encounter of 07/09/14  Wet prep, genital     Status: Abnormal   Collection Time: 07/10/14 12:04 AM  Result Value Ref Range Status   Yeast Wet Prep HPF POC NONE SEEN NONE SEEN Final   Trich, Wet Prep NONE SEEN NONE SEEN Final   Clue Cells Wet Prep HPF POC MANY (A) NONE SEEN Final   WBC, Wet Prep HPF POC MANY (A) NONE SEEN Final     Radiologic Imaging: Us Renal  Result Date: 02/02/2018 CLINICAL DATA:  Elevated serum creatinine EXAM: RENAL / URINARY TRACT ULTRASOUND COMPLETE COMPARISON:  CT abdomen pelvis November 15, 2016 FINDINGS: Right Kidney: Length: 12.3 cm. Echogenicity within normal limits. No mass or hydronephrosis visualized. Left Kidney: Length: 12.5 cm. Echogenicity within normal limits. No hydronephrosis visualized. There is mild left hydronephrosis. Bladder: The bladder is partially decompressed limiting evaluation. IMPRESSION: Mild left hydronephrosis. Electronically Signed   By: Wei-Chen  Lin M.D.   On: 02/02/2018 18:35    Impression/Assessment:  27-year-old female status post vaginal hysterectomy with mild left hydronephrosis.  She is asymptomatic.  With a normal contralateral kidney I would not expect mild left hydronephrosis to be responsible for a bump in her creatinine.  Her creatinine this morning was 1.48.  Recommendation:  CT urogram today.  02/03/2018, 7:48 AM  Scott Stoioff,  MD       

## 2018-02-03 NOTE — Progress Notes (Signed)
Patient to CT via wheelchair.

## 2018-02-03 NOTE — Consult Note (Signed)
Urology Consult  I have been asked to see the patient by Dr. Valentino Saxon, for evaluation and management of left hydronephrosis.  Chief Complaint: N/A  History of Present Illness: Melissa Mcmahon is a 27 y.o. year old female status post an uneventful vaginal hysterectomy on 02/01/2018.  She had significant postoperative nausea and vomiting.  On postoperative day #1 her nausea and vomiting resolved.  A creatinine had bumped from 0.72 to 1.27.  This was repeated and was 1.4.  A renal ultrasound was performed which showed mild left hydronephrosis.  She denies flank or abdominal pain.  She has no voiding complaints and denies gross hematuria.  Her contralateral kidney was normal in appearance.  There is no previous history of urologic problems.  Past Medical History:  Diagnosis Date  . Acid reflux   . Asthma    H/O AS A CHILD-NO INHALERS   . Endometriosis   . Headache    MIGRAINES-RARE  . History of hiatal hernia     Past Surgical History:  Procedure Laterality Date  . CHOLECYSTECTOMY N/A 10/07/2017   Procedure: LAPAROSCOPIC CHOLECYSTECTOMY;  Surgeon: Ancil Linsey, MD;  Location: [facility] ORS;  Service: General;  Laterality: N/A;  . FEET SURGERY    . KNEE ARTHROSCOPY    . LAPAROSCOPY N/A 02/08/2015   Procedure: LAPAROSCOPY DIAGNOSTIC;  Surgeon: Vena Austria, MD;  Location: [facility] OR[facility];  Service: Gynecology;  Laterality: N/A;  . TUBAL LIGATION    . VAGINAL HYSTERECTOMY Bilateral 02/01/2018   Procedure: HYSTERECTOMY VAGINAL WITH BILATERAL SALPINGECTOMY;  Surgeon: Hildred Laser, MD;  Location: [facility];  Service: Gynecology;  Laterality: Bilateral;    Home Medications:  Current Meds  Medication Sig  . omeprazole (PRILOSEC) 20 MG capsule Take 20 mg by mouth every morning.     Allergies:  Allergies  Allergen Reactions  . Penicillins Hives    Has patient had a PCN reaction causing immediate rash, facial/tongue/throat swelling, SOB or lightheadedness with hypotension: unkn Has patient  had a PCN reaction causing severe rash involving mucus membranes or skin necrosis: no Has patient had a PCN reaction that required hospitalization: no Has patient had a PCN reaction occurring within the last 10 years: no If all of the above answers are "NO", then may proceed with Cephalosporin use.     Family History  Problem Relation Age of Onset  . Diabetes Father   . Diabetes Maternal Aunt   . Breast cancer Neg Hx   . Ovarian cancer Neg Hx   . Colon cancer Neg Hx     Social History:  reports that she quit smoking about 7 months ago. Her smoking use included e-cigarettes and cigarettes. She has a 7.00 pack-year smoking history. She uses smokeless tobacco. She reports that she does not drink alcohol or use drugs.  ROS: A complete review of systems was performed.  All systems are negative except for pertinent findings as noted.  Physical Exam:  Vital signs in last 24 hours: Temp:  [98 F (36.7 C)-98.5 F (36.9 C)] 98.5 F (36.9 C) (10/01 2323) Pulse Rate:  [54-65] 65 (10/01 2323) Resp:  [18-20] 18 (10/01 2323) BP: (119-125)/(81-93) 121/92 (10/01 2323) SpO2:  [98 %-100 %] 100 % (10/01 2323) Constitutional:  Alert and oriented, No acute distress HEENT: [place] AT, moist mucus membranes.  Trachea midline, no masses Cardiovascular: Regular rate and rhythm, no clubbing, cyanosis, or edema. Respiratory: Normal respiratory effort, lungs clear bilaterally GI: Abdomen is soft, nontender, nondistended, no abdominal masses GU: No  CVA tenderness Skin: No rashes, bruises or suspicious lesions Lymph: No cervical or inguinal adenopathy Neurologic: Grossly intact, no focal deficits, moving all 4 extremities Psychiatric: Normal mood and affect   Laboratory Data:  Recent Labs    02/02/18 0536  WBC 11.3*  HGB 11.8*  HCT 33.3*   Recent Labs    02/02/18 1308 02/02/18 1746 02/03/18 0507  NA  --  138 140  K  --  4.2 4.4  CL  --  106 107  CO2  --  28 28  GLUCOSE  --  86 92  BUN  --   19 20  CREATININE 1.41* 1.43* 1.48*  CALCIUM  --  8.5* 8.7*   No results for input(s): LABPT, INR in the last 72 hours. No results for input(s): LABURIN in the last 72 hours. Results for orders placed or performed during the hospital encounter of 07/09/14  Wet prep, genital     Status: Abnormal   Collection Time: 07/10/14 12:04 AM  Result Value Ref Range Status   Yeast Wet Prep HPF POC NONE SEEN NONE SEEN Final   Trich, Wet Prep NONE SEEN NONE SEEN Final   Clue Cells Wet Prep HPF POC MANY (A) NONE SEEN Final   WBC, Wet Prep HPF POC MANY (A) NONE SEEN Final     Radiologic Imaging: US Renal  Result Date: 02/02/2018 CLINICAL DATA:  Elevated serum creatinine EXAM: RENAL / URINARY TRACT ULTRASOUND COMPLETE COMPARISON:  CT abdomen pelvis November 15, 2016 FINDINGS: Right Kidney: Length: 12.3 cm. Echogenicity within normal limits. No mass or hydronephrosis visualized. Left Kidney: Length: 12.5 cm. Echogenicity within normal limits. No hydronephrosis visualized. There is mild left hydronephrosis. Bladder: The bladder is partially decompressed limiting evaluation. IMPRESSION: Mild left hydronephrosis. Electronically Signed   By: Sherian Rein M.D.   On: 02/02/2018 18:35    Impression/Assessment:  27 year old female status post vaginal hysterectomy with mild left hydronephrosis.  She is asymptomatic.  With a normal contralateral kidney I would not expect mild left hydronephrosis to be responsible for a bump in her creatinine.  Her creatinine this morning was 1.48.  Recommendation:  CT urogram today.  02/03/2018, 7:48 AM  Irineo Axon,  MD

## 2018-02-03 NOTE — Progress Notes (Signed)
Post-Operative Day # 2, s/p TVH with bilateral salpingectomy. PMH includes dysmenorrhea, dyspareunia, menorrhagia.   Subjective: up ad lib, voiding, tolerating PO and + flatus Has ambulated. Denies difficulty voiding. Reports passage of flatus but no BM. Denies fevers, chills, SOB, chest pain.   Objective: Temp:  [98 F (36.7 C)-98.5 F (36.9 C)] 98.5 F (36.9 C) (10/01 2323) Pulse Rate:  [54-68] 65 (10/01 2323) Resp:  [18-20] 18 (10/01 2323) BP: (119-125)/(81-93) 121/92 (10/01 2323) SpO2:  [98 %-100 %] 100 % (10/01 2323)  Physical Exam:  General: alert and no distress  Lungs: clear to auscultation bilaterally Heart: regular rate and rhythm, S1, S2 normal, no murmur, click, rub or gallop Abdomen: normal findings: bowel sounds normal, no masses palpable, no organomegaly and soft. Appropriately tender to palpation.  Pelvis:Bleeding: none,  Incision: N/A Extremities: DVT Evaluation: No evidence of DVT seen on physical exam. Negative Homan's sign. No cords or calf tenderness. No significant calf/ankle edema.  Labs:  CBC Latest Ref Rng & Units 02/02/2018 01/23/2018  WBC 3.6 - 11.0 K/uL 11.3(H) 7.2  Hemoglobin 12.0 - 16.0 g/dL 11.8(L) 13.4  Hematocrit 35.0 - 47.0 % 33.3(L) 39.2  Platelets 150 - 440 K/uL 200 265     Lab Results  Component Value Date   CREATININE 1.48 (H) 02/03/2018   CREATININE 1.43 (H) 02/02/2018   CREATININE 1.41 (H) 02/02/2018   Lab Results  Component Value Date   CREATININE 1.48 (H) 02/03/2018   BUN 20 02/03/2018   NA 140 02/03/2018   K 4.4 02/03/2018   CL 107 02/03/2018   CO2 28 02/03/2018     US RENAL CLINICAL DATA:  Elevated serum creatinine  EXAM: RENAL / URINARY TRACT ULTRASOUND COMPLETE  COMPARISON:  CT abdomen pelvis November 15, 2016  FINDINGS: Right Kidney:  Length: 12.3 cm. Echogenicity within normal limits. No mass or hydronephrosis visualized.  Left Kidney:  Length: 12.5 cm. Echogenicity within normal limits. No hydronephrosis  visualized. There is mild left hydronephrosis.  Bladder:  The bladder is partially decompressed limiting evaluation.  IMPRESSION: Mild left hydronephrosis.  Electronically Signed   By: Sherian Rein M.D.   On: 02/02/2018 18:35   Assessment: s/p Procedure(s): HYSTERECTOMY VAGINAL WITH BILATERAL SALPINGECTOMY (Bilateral): stable and partially tolerating diet Elevated Creatinine with mild left hydronephrosis  Plan: Regular diet, as tolerated.  Encourage ambulation Continue PO medication.  Consulted Urology for increasing creatinine (suspected urethral obstruction or surgical injury on left), will see patient this morning. CT abdomen/pelvis (urogram) ordered per recommendations.     LOS: 2 day     Hildred Laser, MD [facility]

## 2018-02-04 ENCOUNTER — Inpatient Hospital Stay: Payer: Commercial Managed Care - PPO | Admitting: Anesthesiology

## 2018-02-04 ENCOUNTER — Encounter: Payer: Self-pay | Admitting: Anesthesiology

## 2018-02-04 ENCOUNTER — Encounter
Admission: RE | Disposition: A | Discharge status: Home / Self Care | Payer: Self-pay | Source: Home / Self Care | Attending: Obstetrics and Gynecology

## 2018-02-04 DIAGNOSIS — N1330 Unspecified hydronephrosis: Secondary | ICD-10-CM | POA: Diagnosis not present

## 2018-02-04 HISTORY — PX: CYSTOSCOPY WITH STENT PLACEMENT: SHX5790

## 2018-02-04 HISTORY — PX: CYSTOSCOPY W/ RETROGRADES: SHX1426

## 2018-02-04 SURGERY — CYSTOSCOPY, WITH RETROGRADE PYELOGRAM
Anesthesia: General | Laterality: Left

## 2018-02-04 MED ORDER — PROPOFOL 10 MG/ML IV BOLUS
INTRAVENOUS | Status: AC
  Filled 2018-02-04: qty 20

## 2018-02-04 MED ORDER — SCOPOLAMINE 1 MG/3DAYS TD PT72
1.0000 | MEDICATED_PATCH | Freq: Once | TRANSDERMAL | Status: DC
  Administered 2018-02-04: 1.5 mg via TRANSDERMAL

## 2018-02-04 MED ORDER — FENTANYL CITRATE (PF) 100 MCG/2ML IJ SOLN
INTRAMUSCULAR | Status: AC
  Filled 2018-02-04: qty 2

## 2018-02-04 MED ORDER — MIDAZOLAM HCL 2 MG/2ML IJ SOLN
INTRAMUSCULAR | Status: DC | PRN
  Administered 2018-02-04: 2 mg via INTRAVENOUS

## 2018-02-04 MED ORDER — OXYCODONE-ACETAMINOPHEN 5-325 MG PO TABS
ORAL_TABLET | ORAL | Status: AC
  Filled 2018-02-04: qty 1

## 2018-02-04 MED ORDER — DEXAMETHASONE SODIUM PHOSPHATE 10 MG/ML IJ SOLN
INTRAMUSCULAR | Status: DC | PRN
  Administered 2018-02-04: 6 mg via INTRAVENOUS

## 2018-02-04 MED ORDER — FENTANYL CITRATE (PF) 100 MCG/2ML IJ SOLN
INTRAMUSCULAR | Status: DC | PRN
  Administered 2018-02-04: 100 ug via INTRAVENOUS

## 2018-02-04 MED ORDER — LIDOCAINE HCL (CARDIAC) PF 100 MG/5ML IV SOSY
PREFILLED_SYRINGE | INTRAVENOUS | Status: DC | PRN
  Administered 2018-02-04: 100 mg via INTRAVENOUS

## 2018-02-04 MED ORDER — DOCUSATE SODIUM 100 MG PO CAPS: 100.0000 mg | ORAL_CAPSULE | Freq: Two times a day (BID) | ORAL | 0 refills | Status: DC | PRN

## 2018-02-04 MED ORDER — KETOROLAC TROMETHAMINE 30 MG/ML IJ SOLN: 30.0000 mg | Freq: Four times a day (QID) | INTRAMUSCULAR | Status: DC

## 2018-02-04 MED ORDER — OXYCODONE-ACETAMINOPHEN 5-325 MG PO TABS: 1.0000 | ORAL_TABLET | Freq: Four times a day (QID) | ORAL | 0 refills | Status: DC | PRN

## 2018-02-04 MED ORDER — OXYCODONE-ACETAMINOPHEN 5-325 MG PO TABS
2.0000 | ORAL_TABLET | ORAL | Status: DC
  Administered 2018-02-04 – 2018-02-05 (×4): 2 via ORAL
  Filled 2018-02-04 (×4): qty 2

## 2018-02-04 MED ORDER — LACTATED RINGERS IV SOLN
INTRAVENOUS | Status: DC | PRN
  Administered 2018-02-04: 08:00:00 via INTRAVENOUS

## 2018-02-04 MED ORDER — PROMETHAZINE HCL 25 MG/ML IJ SOLN: 6.2500 mg | INTRAMUSCULAR | Status: DC | PRN

## 2018-02-04 MED ORDER — FENTANYL CITRATE (PF) 100 MCG/2ML IJ SOLN: 25.0000 ug | INTRAMUSCULAR | Status: DC | PRN

## 2018-02-04 MED ORDER — MIDAZOLAM HCL 2 MG/2ML IJ SOLN
INTRAMUSCULAR | Status: AC
  Filled 2018-02-04: qty 2

## 2018-02-04 MED ORDER — PROPOFOL 10 MG/ML IV BOLUS
INTRAVENOUS | Status: DC | PRN
  Administered 2018-02-04: 150 mg via INTRAVENOUS

## 2018-02-04 MED ORDER — DIPHENHYDRAMINE HCL 25 MG PO CAPS
25.0000 mg | ORAL_CAPSULE | ORAL | Status: DC | PRN
  Administered 2018-02-04 – 2018-02-05 (×2): 25 mg via ORAL
  Filled 2018-02-04 (×2): qty 1

## 2018-02-04 MED ORDER — SCOPOLAMINE 1 MG/3DAYS TD PT72
MEDICATED_PATCH | TRANSDERMAL | Status: AC
  Filled 2018-02-04: qty 1

## 2018-02-04 MED ORDER — KETOROLAC TROMETHAMINE 30 MG/ML IJ SOLN
30.0000 mg | Freq: Four times a day (QID) | INTRAMUSCULAR | Status: DC
  Administered 2018-02-05 (×2): 30 mg via INTRAVENOUS
  Filled 2018-02-04 (×2): qty 1

## 2018-02-04 MED ORDER — IBUPROFEN 800 MG PO TABS: 800.0000 mg | ORAL_TABLET | Freq: Three times a day (TID) | ORAL | 1 refills | Status: DC | PRN

## 2018-02-04 SURGICAL SUPPLY — 24 items
BAG DRAIN CYSTO-URO LG1000N (MISCELLANEOUS) ×3 IMPLANT
BRUSH SCRUB EZ  4% CHG (MISCELLANEOUS) ×2
BRUSH SCRUB EZ 4% CHG (MISCELLANEOUS) ×1 IMPLANT
CATH URETL 5X70 OPEN END (CATHETERS) ×3 IMPLANT
CONRAY 43 FOR UROLOGY 50M (MISCELLANEOUS) ×3 IMPLANT
DRAPE UTILITY 15X26 TOWEL STRL (DRAPES) ×3 IMPLANT
GLIDEWIRE STR 0.035 150CM 3CM (WIRE) ×2 IMPLANT
GLOVE BIO SURGEON STRL SZ 6.5 (GLOVE) ×2 IMPLANT
GLOVE BIO SURGEONS STRL SZ 6.5 (GLOVE) ×1
GOWN STRL REUS W/ TWL LRG LVL3 (GOWN DISPOSABLE) ×2 IMPLANT
GOWN STRL REUS W/TWL LRG LVL3 (GOWN DISPOSABLE) ×4
KIT TURNOVER CYSTO (KITS) ×3 IMPLANT
PACK CYSTO AR (MISCELLANEOUS) ×3 IMPLANT
SENSORWIRE 0.038 NOT ANGLED (WIRE) ×3
SET CYSTO W/LG BORE CLAMP LF (SET/KITS/TRAYS/PACK) ×3 IMPLANT
SOL .9 NS 3000ML IRR  AL (IV SOLUTION) ×2
SOL .9 NS 3000ML IRR UROMATIC (IV SOLUTION) ×1 IMPLANT
STENT URET 6FRX24 CONTOUR (STENTS) IMPLANT
STENT URET 6FRX26 CONTOUR (STENTS) IMPLANT
STENT URETL SOFT 4.8X22 (STENTS) ×2 IMPLANT
SURGILUBE 2OZ TUBE FLIPTOP (MISCELLANEOUS) ×3 IMPLANT
SYRINGE IRR TOOMEY STRL 70CC (SYRINGE) ×3 IMPLANT
WATER STERILE IRR 1000ML POUR (IV SOLUTION) ×3 IMPLANT
WIRE SENSOR 0.038 NOT ANGLED (WIRE) ×1 IMPLANT

## 2018-02-04 NOTE — Interval H&P Note (Signed)
History and Physical Interval Note: She has developed progressive left lower quadrant abdominal pain throughout the day yesterday.  For cystoscopy with left retrograde pyelogram and possible stent placement.  All questions were answered and she desires to proceed.  02/04/2018 7:12 AM  Melissa Mcmahon  has presented today for surgery, with the diagnosis of n/a  The various methods of treatment have been discussed with the patient and family. After consideration of risks, benefits and other options for treatment, the patient has consented to  Procedure(s): CYSTOSCOPY WITH RETROGRADE PYELOGRAM (Left) CYSTOSCOPY WITH STENT PLACEMENT (Left) as a surgical intervention .  The patient's history has been reviewed, patient examined, no change in status, stable for surgery.  I have reviewed the patient's chart and labs.  Questions were answered to the patient's satisfaction.     Scott C Stoioff

## 2018-02-04 NOTE — Plan of Care (Signed)
Vs stable; up ad lib; does ambulate in hallway independently; pain is usually 6-8; pt requests motrin, percocet and dilaudid when it's available; pt having procedure today at 0715; pt has been NPO after midnight

## 2018-02-04 NOTE — Op Note (Signed)
Preoperative diagnosis:  1. Left hydronephrosis   Postoperative diagnosis:  1. Left hydronephrosis  Procedure:  1. Cystoscopy 2. Left ureteroscopy 3. Left ureteral stent placement (4.8FR) 22 cm 4. Left retrograde pyelography with interpretation   Surgeon: Melissa Mcmahon, M.D.  Anesthesia: General  Complications: None  Intraoperative findings:  1.  Retrograde pyelogram-narrowing with kinking/tortuosity of the left distal ureter.  Proximal mild to moderate hydroureter/hydronephrosis  EBL: Minimal  Specimens: None  Indication: Melissa Mcmahon is a 27 y.o. female status post vaginal hysterectomy on 02/01/2018.  She was noted on postoperative day #1 to have a bump in her creatinine.  A renal ultrasound showed mild left hydronephrosis.  A CT showed moderate left hydronephrosis/hydroureter to at least the mid ureter.  Over the last 24 hours she has developed progressive left flank pain.  After reviewing the management options for treatment, he elected to proceed with the above surgical procedure(s). We have discussed the potential benefits and risks of the procedure, side effects of the proposed treatment, the likelihood of the patient achieving the goals of the procedure, and any potential problems that might occur during the procedure or recuperation. Informed consent has been obtained.  Description of procedure:  The patient was taken to the operating room and general anesthesia was induced.  The patient was placed in the dorsal lithotomy position, prepped and draped in the usual sterile fashion, and preoperative antibiotics were administered. A preoperative time-out was performed.   Cystourethroscopy was performed.  The patient's urethra was examined and was normal in appearance.  The bladder was then systematically examined in its entirety. There was no evidence for any bladder tumors, stones, or other mucosal pathology.  The ureteral orifices were normal appearing bilaterally.  No  efflux was noted from the left ureteral orifice.  Attention then turned to the left ureteral orifice.  On fluoroscopy there was mild to moderate hydroureter and column of contrast with tapering to the distal ureter.  A 0.038 Sensor wire was placed through the cystoscope and into the left ureteral orifice however the wire would not advance and was deviating medially.  A 0.038 Glidewire was also attempted and would not advance.  The cystoscope was removed and a 4.5 French semirigid ureteroscope was passed per urethra.  The distal ureter was engaged without dilation and within approximately 1 cm narrowing was noted.  The Glidewire was advanced through this narrowed area under direct vision and there was looping of the ureter with advancement of the wire into the dilated distal portion of the ureter however it was difficult to get the wire to advance further proximally.  The wire was removed and a ureteral catheter was positioned at the narrowed area and retrograde pyelogram was performed.  The distal ureter was looped and narrowed under real-time fluoroscopy.  The Glidewire was repassed and I was able to advance it to the UPJ with straightening of the kinked portion of the ureter.  A 6 French open-ended ureteral catheter would not advance more than a few centimeters beyond this narrowed area.  The Glidewire was removed and retrograde pyelogram confirmed position of the catheter in the distal ureter.  It was replaced with a sensor wire and the open-ended catheter was removed.  I was able to advance a 4.8 French/22 cm to the upper proximal ureter.  The guidewire was removed and after stent placement all the contrast had drained from the system.  Due to narrowing and tightness of the distal ureter it was felt the stent would  stay in place without a curl in the renal pelvis.  The bladder was then emptied and all instruments were removed.  After anesthetic reversal patient was transported to the PACU in stable  condition.   Recommendation: I contacted Dr. Valentino Saxon postoperatively and discussed my findings with her.  It was felt a suture was in close proximity to the ureter accounting for the kinking.  These were absorbable sutures and will leave the stent indwelling for an appropriate amount of time to allow for this to occur.  She has a small caliber stent and tight ureter and if she does not get adequate drainage would recommend percutaneous nephrostomy placement and subsequent antegrade stent placement.

## 2018-02-04 NOTE — Anesthesia Postprocedure Evaluation (Signed)
Anesthesia Post Note  Patient: Melissa Mcmahon  Procedure(s) Performed: CYSTOSCOPY WITH RETROGRADE PYELOGRAM (Left ) CYSTOSCOPY WITH STENT PLACEMENT (Left )  Patient location during evaluation: PACU Anesthesia Type: General Level of consciousness: awake and alert Pain management: pain level controlled Vital Signs Assessment: post-procedure vital signs reviewed and stable Respiratory status: spontaneous breathing, nonlabored ventilation, respiratory function stable and patient connected to nasal cannula oxygen Cardiovascular status: blood pressure returned to baseline and stable Postop Assessment: no apparent nausea or vomiting Anesthetic complications: no     Last Vitals:  Vitals:   02/04/18 1011 02/04/18 1108  BP: (!) 145/92 (!) 160/86  Pulse: 63 (!) 59  Resp: 16 16  Temp: 37.3 C 36.9 C  SpO2: 99% 98%    Last Pain:  Vitals:   02/04/18 1109  TempSrc:   PainSc: 8                  Lenard Simmer

## 2018-02-04 NOTE — Progress Notes (Signed)
Post-Operative Day # 3, s/p TVH with bilateral salpingectomy.  POD#0 s/p cystoscopy with left retrograde pyelogram and stent placement. PMH includes dysmenorrhea, dyspareunia, menorrhagia.   Subjective: Patient notes overall feeling better than she was this morning.  Is tolerating diet.   Objective: Temp:  [97.5 F (36.4 C)-99.2 F (37.3 C)] 98.5 F (36.9 C) (10/03 1108) Pulse Rate:  [59-99] 59 (10/03 1108) Resp:  [15-20] 16 (10/03 1108) BP: (106-160)/(74-96) 160/86 (10/03 1108) SpO2:  [97 %-100 %] 98 % (10/03 1108)  Physical Exam:  General: alert and no distress  Lungs: clear to auscultation bilaterally Heart: regular rate and rhythm, S1, S2 normal, no murmur, click, rub or gallop Abdomen: normal findings: bowel sounds normal, no masses palpable, no organomegaly and soft. Appropriately tender to palpation.  Pelvis:Bleeding: none,  Incision: N/A Extremities: DVT Evaluation: No evidence of DVT seen on physical exam. Negative Homan's sign. No cords or calf tenderness. No significant calf/ankle edema.  Labs:  CBC Latest Ref Rng & Units 02/02/2018 01/23/2018  WBC 3.6 - 11.0 K/uL 11.3(H) 7.2  Hemoglobin 12.0 - 16.0 g/dL 11.8(L) 13.4  Hematocrit 35.0 - 47.0 % 33.3(L) 39.2  Platelets 150 - 440 K/uL 200 265     Lab Results  Component Value Date   CREATININE 1.48 (H) 02/03/2018   CREATININE 1.43 (H) 02/02/2018   CREATININE 1.41 (H) 02/02/2018   Lab Results  Component Value Date   CREATININE 1.48 (H) 02/03/2018   BUN 20 02/03/2018   NA 140 02/03/2018   K 4.4 02/03/2018   CL 107 02/03/2018   CO2 28 02/03/2018     US RENAL CLINICAL DATA:  Elevated serum creatinine  EXAM: RENAL / URINARY TRACT ULTRASOUND COMPLETE  COMPARISON:  CT abdomen pelvis November 15, 2016  FINDINGS: Right Kidney:  Length: 12.3 cm. Echogenicity within normal limits. No mass or hydronephrosis visualized.  Left Kidney:  Length: 12.5 cm. Echogenicity within normal limits. No hydronephrosis  visualized. There is mild left hydronephrosis.  Bladder:  The bladder is partially decompressed limiting evaluation.  IMPRESSION: Mild left hydronephrosis.  Electronically Signed   By: Sherian Rein M.D.   On: 02/02/2018 18:35    CT ABDOMEN PELVIS W WO CONTRAST CLINICAL DATA:  Post hysterectomy 3 days ago. Left flank pain. Left hydronephrosis on ultrasound.  EXAM: CT ABDOMEN AND PELVIS WITHOUT AND WITH CONTRAST  TECHNIQUE: Multidetector CT imaging of the abdomen and pelvis was performed following the standard protocol before and following the bolus administration of intravenous contrast.  CONTRAST:  ISOVUE-300 IOPAMIDOL (ISOVUE-300) INJECTION 61%  COMPARISON:  Ultrasound 02/02/2018.  CT 11/15/2016  FINDINGS: Lower chest: No acute abnormality  Hepatobiliary: No focal liver abnormality is seen. Status post cholecystectomy. No biliary dilatation.  Pancreas: No focal abnormality or ductal dilatation.  Spleen: No focal abnormality.  Normal size.  Adrenals/Urinary Tract: There is left hydronephrosis and delayed excretion of contrast from the left kidney. The left ureter can be followed into the left side of the pelvis near the left ovary, but cannot be followed beyond that point. No contrast excretion into the left renal collecting system and ureter on the delayed views. Right kidney unremarkable. No adrenal mass. Gas present in the urinary bladder, presumably from recent instrumentation.  Stomach/Bowel: Appendix is normal. Stomach, large and small bowel grossly unremarkable.  Vascular/Lymphatic: No evidence of aneurysm or adenopathy.  Reproductive: Changes of hysterectomy.  No adnexal mass.  Other: Small amount of free fluid in the pelvis.  No free air.  Musculoskeletal: No acute bony  abnormality.  IMPRESSION: Obstruction of the left kidney with moderate left hydronephrosis and delayed excretion of contrast from the left kidney. No  contrast opacification of the dilated left ureter. The ureter can be followed into the left side of the pelvis near the left ovary, but cannot be visualized beyond that point. No visible obstructing stones.  Changes of hysterectomy. Stranding and small amount of free fluid in the pelvis, likely postoperative.  Prior cholecystectomy.  Electronically Signed   By: Charlett Nose M.D.   On: 02/03/2018 08:44   Assessment: s/p Procedure(s): HYSTERECTOMY VAGINAL WITH BILATERAL SALPINGECTOMY (Bilateral), CYSTOSCOPY WITH RETROGRADE PYELOGRAM (Left), CYSTOSCOPY WITH STENT PLACEMENT (Left:    Plan: Regular diet, as tolerated.  Encourage ambulation Continue PO medication.  Discussed with urology that if pain is controlled, can d/c home later this evening. Otherwise, can d/c home in a.m.     LOS: 3 day     Hildred Laser, MD [facility]

## 2018-02-04 NOTE — Anesthesia Post-op Follow-up Note (Signed)
Anesthesia QCDR form completed.        

## 2018-02-04 NOTE — Anesthesia Procedure Notes (Signed)
Procedure Name: LMA Insertion Date/Time: 02/04/2018 7:53 AM Performed by: Danelle Berry, CRNA Pre-anesthesia Checklist: Patient identified, Emergency Drugs available, Suction available, Patient being monitored and Timeout performed Patient Re-evaluated:Patient Re-evaluated prior to induction Oxygen Delivery Method: Circle system utilized and Simple face mask Preoxygenation: Pre-oxygenation with 100% oxygen Induction Type: IV induction Ventilation: Mask ventilation without difficulty LMA Size: 4.0 Number of attempts: 1 Dental Injury: Teeth and Oropharynx as per pre-operative assessment

## 2018-02-04 NOTE — Transfer of Care (Signed)
Immediate Anesthesia Transfer of Care Note  Patient: Melissa Mcmahon  Procedure(s) Performed: CYSTOSCOPY WITH RETROGRADE PYELOGRAM (Left ) CYSTOSCOPY WITH STENT PLACEMENT (Left )  Patient Location: PACU  Anesthesia Type:General  Level of Consciousness: sedated  Airway & Oxygen Therapy: Patient Spontanous Breathing and Patient connected to face mask oxygen  Post-op Assessment: Report given to RN and Post -op Vital signs reviewed and stable  Post vital signs: Reviewed and stable  Last Vitals:  Vitals Value Taken Time  BP 142/91 02/04/2018  8:53 AM  Temp    Pulse 82 02/04/2018  8:55 AM  Resp 17 02/04/2018  8:55 AM  SpO2 100 % 02/04/2018  8:55 AM  Vitals shown include unvalidated device data.  Last Pain:  Vitals:   02/04/18 0853  TempSrc:   PainSc: (P) Asleep         Complications: No apparent anesthesia complications

## 2018-02-04 NOTE — Progress Notes (Signed)
Patient off the floor for scheduled procedure. Will return to round later this morning.   Hildred Laser, MD [facility]

## 2018-02-05 ENCOUNTER — Encounter: Payer: Self-pay | Admitting: Urology

## 2018-02-05 MED ORDER — KETOROLAC TROMETHAMINE 10 MG PO TABS: 10.0000 mg | ORAL_TABLET | Freq: Four times a day (QID) | ORAL | 0 refills | Status: DC

## 2018-02-05 NOTE — Addendum Note (Signed)
Addendum  created 02/05/18 1619 by Stormy Fabian, CRNA   Charge Capture section accepted

## 2018-02-05 NOTE — Progress Notes (Signed)
Provided and reviewed discharge paperwork and prescriptions. Pt verbalized understanding of instructions provided. Follow up with Dr. Valentino Saxon provided, but pt is to make appointment with urology herself. Discharged with family member to go home. Per pt request, she ambulated independently to the vehicle.

## 2018-02-05 NOTE — Progress Notes (Signed)
Post-Operative Day # 4, s/p TVH with bilateral salpingectomy.  POD#1 s/p cystoscopy with left retrograde pyelogram and stent placement for urethral kinking. PMH includes dysmenorrhea, dyspareunia, menorrhagia.   Subjective: Patient states she is feeling much better. Remained overnight for further observation and pain management after cystoscopy.  Is ambulating, tolerating regular diet, and notes pain is much better controlled.  Objective: Temp:  [98.1 F (36.7 C)-99.2 F (37.3 C)] 98.7 F (37.1 C) (10/04 0900) Pulse Rate:  [48-65] 49 (10/04 0900) Resp:  [16-18] 18 (10/04 0900) BP: (99-160)/(64-86) 110/79 (10/04 0900) SpO2:  [97 %-100 %] 100 % (10/04 0900)  Physical Exam:  General: alert and no distress  Lungs: clear to auscultation bilaterally Heart: regular rate and rhythm, S1, S2 normal, no murmur, click, rub or gallop Abdomen: normal findings: bowel sounds normal, no masses palpable, no organomegaly and soft. Appropriately tender to palpation.  Pelvis:Bleeding: none,  Incision: N/A Extremities: DVT Evaluation: No evidence of DVT seen on physical exam. Negative Homan's sign. No cords or calf tenderness. No significant calf/ankle edema.  Labs:  CBC Latest Ref Rng & Units 02/02/2018 01/23/2018  WBC 3.6 - 11.0 K/uL 11.3(H) 7.2  Hemoglobin 12.0 - 16.0 g/dL 11.8(L) 13.4  Hematocrit 35.0 - 47.0 % 33.3(L) 39.2  Platelets 150 - 440 K/uL 200 265     Lab Results  Component Value Date   CREATININE 1.48 (H) 02/03/2018   CREATININE 1.43 (H) 02/02/2018   CREATININE 1.41 (H) 02/02/2018   Lab Results  Component Value Date   CREATININE 1.48 (H) 02/03/2018   BUN 20 02/03/2018   NA 140 02/03/2018   K 4.4 02/03/2018   CL 107 02/03/2018   CO2 28 02/03/2018     US RENAL CLINICAL DATA:  Elevated serum creatinine  EXAM: RENAL / URINARY TRACT ULTRASOUND COMPLETE  COMPARISON:  CT abdomen pelvis November 15, 2016  FINDINGS: Right Kidney:  Length: 12.3 cm. Echogenicity within normal  limits. No mass or hydronephrosis visualized.  Left Kidney:  Length: 12.5 cm. Echogenicity within normal limits. No hydronephrosis visualized. There is mild left hydronephrosis.  Bladder:  The bladder is partially decompressed limiting evaluation.  IMPRESSION: Mild left hydronephrosis.  Electronically Signed   By: Sherian Rein M.D.   On: 02/02/2018 18:35    CT ABDOMEN PELVIS W WO CONTRAST CLINICAL DATA:  Post hysterectomy 3 days ago. Left flank pain. Left hydronephrosis on ultrasound.  EXAM: CT ABDOMEN AND PELVIS WITHOUT AND WITH CONTRAST  TECHNIQUE: Multidetector CT imaging of the abdomen and pelvis was performed following the standard protocol before and following the bolus administration of intravenous contrast.  CONTRAST:  ISOVUE-300 IOPAMIDOL (ISOVUE-300) INJECTION 61%  COMPARISON:  Ultrasound 02/02/2018.  CT 11/15/2016  FINDINGS: Lower chest: No acute abnormality  Hepatobiliary: No focal liver abnormality is seen. Status post cholecystectomy. No biliary dilatation.  Pancreas: No focal abnormality or ductal dilatation.  Spleen: No focal abnormality.  Normal size.  Adrenals/Urinary Tract: There is left hydronephrosis and delayed excretion of contrast from the left kidney. The left ureter can be followed into the left side of the pelvis near the left ovary, but cannot be followed beyond that point. No contrast excretion into the left renal collecting system and ureter on the delayed views. Right kidney unremarkable. No adrenal mass. Gas present in the urinary bladder, presumably from recent instrumentation.  Stomach/Bowel: Appendix is normal. Stomach, large and small bowel grossly unremarkable.  Vascular/Lymphatic: No evidence of aneurysm or adenopathy.  Reproductive: Changes of hysterectomy.  No adnexal mass.  Other: Small amount of free fluid in the pelvis.  No free air.  Musculoskeletal: No acute bony  abnormality.  IMPRESSION: Obstruction of the left kidney with moderate left hydronephrosis and delayed excretion of contrast from the left kidney. No contrast opacification of the dilated left ureter. The ureter can be followed into the left side of the pelvis near the left ovary, but cannot be visualized beyond that point. No visible obstructing stones.  Changes of hysterectomy. Stranding and small amount of free fluid in the pelvis, likely postoperative.  Prior cholecystectomy.  Electronically Signed   By: Charlett Nose M.D.   On: 02/03/2018 08:44   Assessment: s/p Procedure(s): HYSTERECTOMY VAGINAL WITH BILATERAL SALPINGECTOMY (Bilateral), CYSTOSCOPY WITH RETROGRADE PYELOGRAM (Left), CYSTOSCOPY WITH STENT PLACEMENT (Left:    Plan: Regular diet  Encourage ambulation Continue PO medication.  Can d/c home today.     LOS: 3 day     Hildred Laser, MD [facility]

## 2018-02-07 ENCOUNTER — Emergency Department
Admission: EM | Admit: 2018-02-07 | Discharge: 2018-02-07 | Disposition: A | Discharge status: Home / Self Care | Payer: Commercial Managed Care - PPO | Attending: Emergency Medicine | Admitting: Emergency Medicine

## 2018-02-07 ENCOUNTER — Other Ambulatory Visit: Payer: Self-pay

## 2018-02-07 ENCOUNTER — Emergency Department: Payer: Commercial Managed Care - PPO

## 2018-02-07 ENCOUNTER — Encounter: Payer: Self-pay | Admitting: Radiology

## 2018-02-07 DIAGNOSIS — Z79899 Other long term (current) drug therapy: Secondary | ICD-10-CM | POA: Diagnosis not present

## 2018-02-07 DIAGNOSIS — N23 Unspecified renal colic: Secondary | ICD-10-CM | POA: Diagnosis not present

## 2018-02-07 DIAGNOSIS — Z87891 Personal history of nicotine dependence: Secondary | ICD-10-CM | POA: Diagnosis not present

## 2018-02-07 DIAGNOSIS — R1032 Left lower quadrant pain: Secondary | ICD-10-CM | POA: Insufficient documentation

## 2018-02-07 DIAGNOSIS — J45909 Unspecified asthma, uncomplicated: Secondary | ICD-10-CM | POA: Insufficient documentation

## 2018-02-07 DIAGNOSIS — R109 Unspecified abdominal pain: Secondary | ICD-10-CM

## 2018-02-07 LAB — COMPREHENSIVE METABOLIC PANEL
ALT: 91 U/L — ABNORMAL HIGH (ref 0–44)
AST: 91 U/L — ABNORMAL HIGH (ref 15–41)
Albumin: 4 g/dL (ref 3.5–5.0)
Alkaline Phosphatase: 100 U/L (ref 38–126)
Anion gap: 7 (ref 5–15)
BUN: 15 mg/dL (ref 6–20)
CHLORIDE: 104 mmol/L (ref 98–111)
CO2: 30 mmol/L (ref 22–32)
CREATININE: 0.74 mg/dL (ref 0.44–1.00)
Calcium: 9.2 mg/dL (ref 8.9–10.3)
Glucose, Bld: 95 mg/dL (ref 70–99)
Potassium: 4.2 mmol/L (ref 3.5–5.1)
SODIUM: 141 mmol/L (ref 135–145)
Total Bilirubin: 0.6 mg/dL (ref 0.3–1.2)
Total Protein: 6.8 g/dL (ref 6.5–8.1)

## 2018-02-07 LAB — CBC
HCT: 39.5 % (ref 35.0–47.0)
Hemoglobin: 13 g/dL (ref 12.0–16.0)
MCH: 29.8 pg (ref 26.0–34.0)
MCHC: 32.8 g/dL (ref 32.0–36.0)
MCV: 90.8 fL (ref 80.0–100.0)
PLATELETS: 307 10*3/uL (ref 150–440)
RBC: 4.35 MIL/uL (ref 3.80–5.20)
RDW: 13.3 % (ref 11.5–14.5)
WBC: 7.5 10*3/uL (ref 3.6–11.0)

## 2018-02-07 LAB — URINALYSIS, COMPLETE (UACMP) WITH MICROSCOPIC
Bacteria, UA: NONE SEEN
RBC / HPF: >50 RBC/hpf — ABNORMAL HIGH (ref 0–5)
SPECIFIC GRAVITY, URINE: 1.021 (ref 1.005–1.030)

## 2018-02-07 LAB — LIPASE, BLOOD: LIPASE: 22 U/L (ref 11–51)

## 2018-02-07 MED ORDER — ONDANSETRON HCL 4 MG/2ML IJ SOLN
4.0000 mg | Freq: Once | INTRAMUSCULAR | Status: AC
  Administered 2018-02-07: 4 mg via INTRAVENOUS
  Filled 2018-02-07: qty 2

## 2018-02-07 MED ORDER — HYDROMORPHONE HCL 2 MG PO TABS
1.0000 mg | ORAL_TABLET | Freq: Once | ORAL | Status: AC
  Administered 2018-02-07: 1 mg via ORAL
  Filled 2018-02-07: qty 1

## 2018-02-07 MED ORDER — KETOROLAC TROMETHAMINE 30 MG/ML IJ SOLN
15.0000 mg | INTRAMUSCULAR | Status: AC
  Administered 2018-02-07: 15 mg via INTRAVENOUS
  Filled 2018-02-07: qty 1

## 2018-02-07 MED ORDER — HYDROMORPHONE HCL 1 MG/ML IJ SOLN
1.0000 mg | Freq: Once | INTRAMUSCULAR | Status: AC
  Administered 2018-02-07: 1 mg via INTRAVENOUS
  Filled 2018-02-07: qty 1

## 2018-02-07 MED ORDER — FENTANYL CITRATE (PF) 100 MCG/2ML IJ SOLN
50.0000 ug | Freq: Once | INTRAMUSCULAR | Status: AC
  Administered 2018-02-07: 50 ug via INTRAVENOUS
  Filled 2018-02-07: qty 2

## 2018-02-07 MED ORDER — IOPAMIDOL (ISOVUE-300) INJECTION 61%
100.0000 mL | Freq: Once | INTRAVENOUS | Status: AC | PRN
  Administered 2018-02-07: 100 mL via INTRAVENOUS

## 2018-02-07 MED ORDER — HYDROMORPHONE HCL 2 MG PO TABS: 1.0000 mg | ORAL_TABLET | ORAL | 0 refills | Status: AC | PRN

## 2018-02-07 MED ORDER — SODIUM CHLORIDE 0.9 % IV BOLUS
1000.0000 mL | Freq: Once | INTRAVENOUS | Status: AC
  Administered 2018-02-07: 1000 mL via INTRAVENOUS

## 2018-02-07 MED ORDER — ONDANSETRON 4 MG PO TBDP: 4.0000 mg | ORAL_TABLET | Freq: Three times a day (TID) | ORAL | 0 refills | Status: DC | PRN

## 2018-02-07 MED ORDER — KETOROLAC TROMETHAMINE 30 MG/ML IJ SOLN: 15.0000 mg | INTRAMUSCULAR | Status: DC

## 2018-02-07 NOTE — ED Notes (Signed)
Pt aware of need for urine specimen. 

## 2018-02-07 NOTE — ED Notes (Signed)
Pt requesting something else for pain. Dr Scotty Court notified.

## 2018-02-07 NOTE — ED Triage Notes (Signed)
Pt states she had hysterectomy Monday and kidney stent placed Thursday. Pt c/o increased pain.

## 2018-02-07 NOTE — ED Provider Notes (Signed)
[facility] Emergency Department Provider Note  ____________________________________________  Time seen: Approximately 5:30 PM  I have reviewed the triage vital signs and the nursing notes.   HISTORY  Chief Complaint Left flank pain   HPI Melissa Mcmahon is a 27 y.o. female with a history of acid reflux, endometriosis, hysterectomy 6 days ago and left ureteral stent placement 3 days ago  who complains of worsening left flank pain for the past 24 hours.  Constant, waxing waning, worse with urination, severe, no alleviating factors.  Unable to find a position of comfort.Bluford Main like she has a weak urinary stream.  Increased blood in the urine.  No fever chills vomiting or diarrhea.  Belly pain is unchanged from previous, slightly improving after surgery.  Denies trauma.     Past Medical History:  Diagnosis Date  . Acid reflux   . Asthma    H/O AS A CHILD-NO INHALERS   . Endometriosis   . Headache    MIGRAINES-RARE  . History of hiatal hernia      Patient Active Problem List   Diagnosis Date Noted  . S/P vaginal hysterectomy 02/02/2018  . Postoperative state 02/01/2018  . Symptomatic cholelithiasis 10/02/2017     Past Surgical History:  Procedure Laterality Date  . CHOLECYSTECTOMY N/A 10/07/2017   Procedure: LAPAROSCOPIC CHOLECYSTECTOMY;  Surgeon: Ancil Linsey, MD;  Location: [facility] ORS;  Service: General;  Laterality: N/A;  . CYSTOSCOPY W/ RETROGRADES Left 02/04/2018   Procedure: CYSTOSCOPY WITH RETROGRADE PYELOGRAM;  Surgeon: Riki Altes, MD;  Location: [facility] ORS;  Service: Urology;  Laterality: Left;  . CYSTOSCOPY WITH STENT PLACEMENT Left 02/04/2018   Procedure: CYSTOSCOPY WITH STENT PLACEMENT;  Surgeon: Riki Altes, MD;  Location: [facility] ORS;  Service: Urology;  Laterality: Left;  . FEET SURGERY    . KNEE ARTHROSCOPY    . LAPAROSCOPY N/A 02/08/2015   Procedure: LAPAROSCOPY DIAGNOSTIC;  Surgeon: Vena Austria, MD;  Location: [facility]  ORS;  Service: Gynecology;  Laterality: N/A;  . TUBAL LIGATION    . VAGINAL HYSTERECTOMY Bilateral 02/01/2018   Procedure: HYSTERECTOMY VAGINAL WITH BILATERAL SALPINGECTOMY;  Surgeon: Hildred Laser, MD;  Location: [facility] ORS;  Service: Gynecology;  Laterality: Bilateral;     Prior to Admission medications   Medication Sig Start Date End Date Taking? Authorizing Provider  docusate sodium (COLACE) 100 MG capsule Take 1 capsule (100 mg total) by mouth 2 (two) times daily as needed for mild constipation. 02/04/18   Hildred Laser, MD  HYDROmorphone (DILAUDID) 2 MG tablet Take 0.5 tablets (1 mg total) by mouth every 4 (four) hours as needed for up to 3 days for severe pain. 02/07/18 02/10/18  Sharman Cheek, MD  ibuprofen (ADVIL,MOTRIN) 800 MG tablet Take 1 tablet (800 mg total) by mouth every 8 (eight) hours as needed for mild pain or moderate pain (mild pain). 02/04/18   Hildred Laser, MD  ketorolac (TORADOL) 10 MG tablet Take 1 tablet (10 mg total) by mouth every 6 (six) hours. Do not take within 6 hours of Ibuprofen. 02/05/18   Hildred Laser, MD  omeprazole (PRILOSEC) 20 MG capsule Take 20 mg by mouth every morning.     [provider]  ondansetron (ZOFRAN ODT) 4 MG disintegrating tablet Take 1 tablet (4 mg total) by mouth every 8 (eight) hours as needed for nausea or vomiting. 02/07/18   Sharman Cheek, MD  oxyCODONE-acetaminophen (PERCOCET/ROXICET) 5-325 MG tablet Take 1-2 tablets by mouth every 6 (six) hours as needed for severe  pain. 02/04/18   Hildred Laser, MD     Allergies Penicillins   Family History  Problem Relation Age of Onset  . Diabetes Father   . Diabetes Maternal Aunt   . Breast cancer Neg Hx   . Ovarian cancer Neg Hx   . Colon cancer Neg Hx     Social History Social History   Tobacco Use  . Smoking status: Former Smoker    Packs/day: 1.00    Years: 7.00    Pack years: 7.00    Types: E-cigarettes, Cigarettes    Last attempt to quit: 06/07/2017    Years since  quitting: 0.6  . Smokeless tobacco: Current User  Substance Use Topics  . Alcohol use: No  . Drug use: No    Review of Systems  Constitutional:   No fever or chills.  Cardiovascular:   No chest pain or syncope. Respiratory:   No dyspnea or cough. Gastrointestinal:   Left flank pain as above without vomiting and diarrhea.  Musculoskeletal:   Negative for focal pain or swelling All other systems reviewed and are negative except as documented above in ROS and HPI.  ____________________________________________   PHYSICAL EXAM:  VITAL SIGNS: ED Triage Vitals  Enc Vitals Group     BP 02/07/18 1430 (!) 150/92     Pulse Rate 02/07/18 1430 73     Resp 02/07/18 1430 18     Temp 02/07/18 1430 98.6 F (37 C)     Temp src --      SpO2 02/07/18 1430 100 %     Weight 02/07/18 1431 145 lb (65.8 kg)     Height 02/07/18 1431 5\' 3"  (1.6 m)     Head Circumference --      Peak Flow --      Pain Score 02/07/18 1431 10     Pain Loc --      Pain Edu? --      Excl. in GC? --     Vital signs reviewed, nursing assessments reviewed.   Constitutional:   Alert and oriented. Non-toxic appearance. Eyes:   Conjunctivae are normal. EOMI. PERRL. ENT      Head:   Normocephalic and atraumatic.      Nose:   No congestion/rhinnorhea.       Mouth/Throat:   MMM, no pharyngeal erythema. No peritonsillar mass.       Neck:   No meningismus. Full ROM. Hematological/Lymphatic/Immunilogical:   No cervical lymphadenopathy. Cardiovascular:   RRR. Symmetric bilateral radial and DP pulses.  No murmurs. Cap refill less than 2 seconds. Respiratory:   Normal respiratory effort without tachypnea/retractions. Breath sounds are clear and equal bilaterally. No wheezes/rales/rhonchi. Gastrointestinal:   Soft and nontender. Non distended. There is left CVA tenderness.  No rebound, rigidity, or guarding. Musculoskeletal:   Normal range of motion in all extremities. No joint effusions.  No lower extremity tenderness.  No  edema. Neurologic:   Normal speech and language.  Motor grossly intact. No acute focal neurologic deficits are appreciated.  Skin:    Skin is warm, dry and intact. No rash noted.  No petechiae, purpura, or bullae.  ____________________________________________    LABS (pertinent positives/negatives) (all labs ordered are listed, but only abnormal results are displayed) Labs Reviewed  COMPREHENSIVE METABOLIC PANEL - Abnormal; Notable for the following components:      Result Value   AST 91 (*)    ALT 91 (*)    All other components within normal limits  URINALYSIS, COMPLETE (  UACMP) WITH MICROSCOPIC - Abnormal; Notable for the following components:   Color, Urine RED (*)    APPearance CLOUDY (*)    Glucose, UA   (*)    Value: TEST NOT REPORTED DUE TO COLOR INTERFERENCE OF URINE PIGMENT   Hgb urine dipstick   (*)    Value: TEST NOT REPORTED DUE TO COLOR INTERFERENCE OF URINE PIGMENT   Bilirubin Urine   (*)    Value: TEST NOT REPORTED DUE TO COLOR INTERFERENCE OF URINE PIGMENT   Ketones, ur   (*)    Value: TEST NOT REPORTED DUE TO COLOR INTERFERENCE OF URINE PIGMENT   Protein, ur   (*)    Value: TEST NOT REPORTED DUE TO COLOR INTERFERENCE OF URINE PIGMENT   Nitrite   (*)    Value: TEST NOT REPORTED DUE TO COLOR INTERFERENCE OF URINE PIGMENT   Leukocytes, UA   (*)    Value: TEST NOT REPORTED DUE TO COLOR INTERFERENCE OF URINE PIGMENT   RBC / HPF >50 (*)    All other components within normal limits  LIPASE, BLOOD  CBC   ____________________________________________   EKG    ____________________________________________    RADIOLOGY  Ct Abdomen Pelvis W Contrast  Result Date: 02/07/2018 CLINICAL DATA:  Vaginal hysterectomy Monday with stent placement for kinking of the left ureter. Patient has pain in the left lower back and left side with hematuria. EXAM: CT ABDOMEN AND PELVIS WITH CONTRAST TECHNIQUE: Multidetector CT imaging of the abdomen and pelvis was performed using  the standard protocol following bolus administration of intravenous contrast. CONTRAST:  ISOVUE-300 IOPAMIDOL (ISOVUE-300) INJECTION 61% COMPARISON:  CT 02/03/2018 and same day renal ultrasound FINDINGS: LOWER CHEST: Lung bases are clear. Included heart size is normal. No pericardial effusion. HEPATOBILIARY: Liver and gallbladder are normal. PANCREAS: Normal. SPLEEN: Normal. ADRENALS/URINARY TRACT: Kidneys are orthotopic, demonstrating symmetric enhancement. No nephrolithiasis, hydronephrosis or solid renal masses. Left-sided ureteral stent is noted with proximal coil at or just beyond the ureteropelvic juncture (coronal images 45 and 46) and distal coil in the bladder. Air-fluid level in the bladder likely from instrumentation as well as small foci of air in the left renal pelvis. Normal adrenal glands. STOMACH/BOWEL: The stomach, small and large bowel are normal in course and caliber without inflammatory changes. Normal appendix. VASCULAR/LYMPHATIC: Aortoiliac vessels are normal in course and caliber. No lymphadenopathy by CT size criteria. REPRODUCTIVE: Hysterectomy.  No adnexal mass or fluid collection. Trace free fluid in the pelvis either physiologic or related to recent intervention. No enhancing fluid collections to suggest abscess. MUSCULOSKELETAL: Lumbosacral transitional vertebral anatomy bilateral assimilation joints at L5-S1 disc flattening at L5-S1. IMPRESSION: Suspect distal migration of the proximal coil of the patient's left ureteral stent, currently positioned at or just beyond the level of the UPJ. No abnormal fluid collection or postoperative complications noted status post hysterectomy and tubal ligation. Electronically Signed   By: Tollie Eth M.D.   On: 02/07/2018 20:41   US Renal  Result Date: 02/07/2018 CLINICAL DATA:  LEFT flank pain, recent LEFT ureteral stent placement 3 days ago, dysuria, weak urine stream EXAM: RENAL / URINARY TRACT ULTRASOUND COMPLETE COMPARISON:  CT abdomen  and pelvis 02/03/2018 FINDINGS: Right Kidney: Length: 10.6 cm. Normal cortical thickness and echogenicity. No mass, hydronephrosis or shadowing calcification. Left Kidney: Length: 13.0 cm. Normal cortical thickness. Borderline increased cortical echogenicity. No mass or hydronephrosis. No ureteral stent is definitely visualized. Bladder: Contains minimal urine, inadequately evaluated IMPRESSION: No evidence of renal mass or  hydronephrosis. Electronically Signed   By: Ulyses Southward M.D.   On: 02/07/2018 17:37    ____________________________________________   PROCEDURES Procedures  ____________________________________________  DIFFERENTIAL DIAGNOSIS   Ureterolithiasis, stent obstruction, pyelonephritis, hydronephrosis, postop pain  CLINICAL IMPRESSION / ASSESSMENT AND PLAN / ED COURSE  Pertinent labs & imaging results that were available during my care of the patient were reviewed by me and considered in my medical decision making (see chart for details).    Patient is not in distress, nontoxic in appearance.  Vital signs are unremarkable.  Planes of worsening left flank pain.  Exam is overall benign except for some mild left CVA tenderness.  Obtain ultrasound of kidneys today to evaluate for hydronephrosis.  Check the urine.  Labs are unremarkable, vitals are okay.  She took oral Toradol within an hour of coming to the ED today, so I will give her IV fentanyl 50 mcg for pain control.  Clinical Course as of Feb 08 2231  Wynelle Link Feb 07, 2018  1734 UA not consistent with urinary tract infection.  Bacteria, UA: NONE SEEN [PS]  2130 CT unremarkable, suggests a small amount of migration of the stent.  No evidence of ureteral obstruction.  Basically patient is having symptoms of renal colic due to stent migration.  I will discuss with urologist for recommendations, anticipate discharge home with continued hydration and pain control and outpatient follow-up.  Her lab panel is reassuring, vital signs  remain normal.   [PS]  2137 Discussed with urology Dr. Mena Goes, agrees that patient is suitable for outpatient follow-up.  He notes that the stent was unable to be completely advanced due to technical difficulty with the procedure according to his recollection, so the CT finding is likely stable position.   [PS]    Clinical Course User Index [PS] Sharman Cheek, MD     ----------------------------------------- 10:31 PM on 02/07/2018 -----------------------------------------  Due to patient having significant side effects from oxycodone preventing her from using this medicine effectively, I have prescribed her a very limited supply of Dilaudid tablets after reviewing the controlled substance reporting system.  I feel that she is at low risk for abuse and appropriate for this medicine and have directed her to discard any remaining Percocet and keep this secured.  ____________________________________________   FINAL CLINICAL IMPRESSION(S) / ED DIAGNOSES    Final diagnoses:  Left flank pain  Ureteral colic  Postop pain   ED Discharge Orders         Ordered    HYDROmorphone (DILAUDID) 2 MG tablet  Every 4 hours PRN     02/07/18 2226    ondansetron (ZOFRAN ODT) 4 MG disintegrating tablet  Every 8 hours PRN     02/07/18 2226          Portions of this note were generated with dragon dictation software. Dictation errors may occur despite best attempts at proofreading.    Sharman Cheek, MD 02/07/18 2233

## 2018-02-07 NOTE — ED Notes (Signed)
Patient transported to Ultrasound 

## 2018-02-07 NOTE — Discharge Instructions (Signed)
Your labs, urine test, and CT scan today were okay.  We do not find any complications from your recent surgery or ureteral stent.  This appears to be a combination of pain that can happen after hysterectomy combined with the pain of having a stent in your left ureter.  Continue following up with your urologist as needed.  Since you are having bad side effects from Percocet, please discard this medicine.  I have written a prescription for Dilaudid tablets which should help control your pain with fewer side effects.  This is a strong medicine and should be kept in a secure location where small children, pets, or anyone else are unable to access it.  When your pain and healing have improved, discard any unused portion of this medicine.  You should not take Dilaudid and Percocet.  Continue taking anti-inflammatory pain medicine such as Toradol or ibuprofen for the additional pain relief that provides.  Take Zofran as needed for nausea control.

## 2018-02-08 ENCOUNTER — Telehealth: Payer: Self-pay | Admitting: Urology

## 2018-02-08 NOTE — Telephone Encounter (Signed)
Spoke to patient, she was seen in ED yesterday for Stent pain. They did a Renal US and a CT. She is scheduled to come have stent removed with Stoioff on 11/15. They told her the stent has moved and she should contact our office to see about getting the stent removed sooner. Will you please review and advise.

## 2018-02-08 NOTE — Telephone Encounter (Signed)
Reviewing the chart, it looks like stent is exactly where he placed and has not moved.  No indication for early removal.  Please have her follow-up as scheduled.  Vanna Scotland, MD

## 2018-02-08 NOTE — Telephone Encounter (Signed)
Pt calling office, was seen in ER last night, pt was advised to call office and ask if she needed to be seen before her 6 week post op appt due to stent has moved. Please advise pt at [phone].

## 2018-02-09 NOTE — Telephone Encounter (Signed)
Patient notified and voiced understanding.

## 2018-02-10 NOTE — Discharge Summary (Signed)
Gynecology Physician Postoperative Discharge Summary  Patient ID: Melissa Mcmahon MRN: [id number] DOB/AGE: 1991-01-04 27 y.o.  Admit Date: 02/01/2018 Discharge Date: 02/10/2018  Preoperative Diagnoses: PELVIC PAIN, MENORRHAGIA, DYSMENORRHEA, DYSPAREUNIA  Procedures: Procedure(s) (LRB): TOTAL VAGINAL HYSTERECTOMY WITH BILATERAL SALPINGECTOMY,  CYSTOSCOPY WITH RETROGRADE PYELOGRAM (Left) CYSTOSCOPY WITH STENT PLACEMENT (Left)  Hospital Course:  Melissa Mcmahon is a 27 y.o. [id number]  admitted for scheduled surgery.  She underwent the procedures as mentioned above that was relatively uncomplicated.  On POD#1 her creatinine level was noted to be persistently elevated.  A renal ultrasound was ordered, noting mild hydronephrosis of the right ureter.  Urology was consulted, and a CT scan was ordered which continued to note slowly worsening nephrosis. The patient underwent cystoscopy with retrograde pyelogram and left stent placement for suspected left ureteral kinking on POD#3.  This operation was also uncomplicated. For further details about the surgeries, please refer to the respective operative reports.  By time of discharge on POD#4, her pain was controlled on oral pain medications; she was ambulating, voiding without difficulty, tolerating regular diet and passing flatus. She was deemed stable for discharge to home.   Significant Labs: CBC Latest Ref Rng & Units 02/07/2018 02/02/2018 01/23/2018  WBC 3.6 - 11.0 K/uL 7.5 11.3(H) 7.2  Hemoglobin 12.0 - 16.0 g/dL 72.5 11.8(L) 13.4  Hematocrit 35.0 - 47.0 % 39.5 33.3(L) 39.2  Platelets 150 - 440 K/uL 307 200 265    Discharge Exam: Blood pressure 110/79, pulse (!) 49, temperature 98.7 F (37.1 C), temperature source Oral, resp. rate 18, height 5\' 3"  (1.6 m), weight 64.9 kg, last menstrual period 01/11/2018, SpO2 100 %. General appearance: alert and no distress  Resp: clear to auscultation bilaterally  Cardio: regular rate and rhythm  GI: soft,  non-tender; bowel sounds normal; no masses, no organomegaly.  Incision: C/D/I, no erythema, no drainage noted Pelvic: scant blood on pad  Extremities: extremities normal, atraumatic, no cyanosis or edema and Homans sign is negative, no sign of DVT  Discharged Condition: Stable  Disposition: Discharge disposition: 01-Home or Self Care       Discharge Instructions    Discharge patient   Complete by:  As directed    Discharge disposition:  01-Home or Self Care   Discharge patient date:  02/04/2018   Discharge patient   Complete by:  As directed    Discharge disposition:  01-Home or Self Care   Discharge patient date:  02/05/2018     Allergies as of 02/05/2018      Reactions   Penicillins Hives   Has patient had a PCN reaction causing immediate rash, facial/tongue/throat swelling, SOB or lightheadedness with hypotension: unkn Has patient had a PCN reaction causing severe rash involving mucus membranes or skin necrosis: no Has patient had a PCN reaction that required hospitalization: no Has patient had a PCN reaction occurring within the last 10 years: no If all of the above answers are "NO", then may proceed with Cephalosporin use.      Medication List    TAKE these medications   docusate sodium 100 MG capsule Commonly known as:  COLACE Take 1 capsule (100 mg total) by mouth 2 (two) times daily as needed for mild constipation.   ibuprofen 800 MG tablet Commonly known as:  ADVIL,MOTRIN Take 1 tablet (800 mg total) by mouth every 8 (eight) hours as needed for mild pain or moderate pain (mild pain).   ketorolac 10 MG tablet Commonly known as:  TORADOL Take 1 tablet (10  mg total) by mouth every 6 (six) hours. Do not take within 6 hours of Ibuprofen.   omeprazole 20 MG capsule Commonly known as:  PRILOSEC Take 20 mg by mouth every morning.   oxyCODONE-acetaminophen 5-325 MG tablet Commonly known as:  PERCOCET/ROXICET Take 1-2 tablets by mouth every 6 (six) hours as needed  for severe pain.      Future Appointments  Date Time Provider Department Center  02/16/2018  9:45 AM Hildred Laser, MD [facility] None  03/19/2018  9:00 AM Lonna Cobb, Verna Czech, MD [facility] None   Follow-up Information    Hildred Laser, MD Follow up on 02/16/2018.   Specialties:  Obstetrics and Gynecology, Radiology Why:  post-operative visit Contact information: [address] [place] [address] [phone]        Riki Altes, MD Follow up.   Specialty:  Urology Why:  6 weeks for left urethral stent removal Contact information: [address] [place] [address] [phone]           Signed:  Hildred Laser, MD [facility]

## 2018-02-16 ENCOUNTER — Encounter: Payer: Commercial Managed Care - PPO | Admitting: Obstetrics and Gynecology

## 2018-02-17 ENCOUNTER — Encounter: Payer: Self-pay | Admitting: Obstetrics and Gynecology

## 2018-02-17 ENCOUNTER — Ambulatory Visit (INDEPENDENT_AMBULATORY_CARE_PROVIDER_SITE_OTHER): Payer: Commercial Managed Care - PPO | Admitting: Obstetrics and Gynecology

## 2018-02-17 VITALS — BP 123/84 | HR 67 | Ht 63.0 in | Wt 147.1 lb

## 2018-02-17 DIAGNOSIS — Z4889 Encounter for other specified surgical aftercare: Secondary | ICD-10-CM

## 2018-02-17 DIAGNOSIS — N809 Endometriosis, unspecified: Secondary | ICD-10-CM

## 2018-02-17 DIAGNOSIS — Z90710 Acquired absence of both cervix and uterus: Secondary | ICD-10-CM

## 2018-02-17 DIAGNOSIS — Z9889 Other specified postprocedural states: Secondary | ICD-10-CM

## 2018-02-17 DIAGNOSIS — Z960 Presence of urogenital implants: Secondary | ICD-10-CM

## 2018-02-17 NOTE — Progress Notes (Signed)
Pt is present today for post-op visit after a hysterectomy. Pt stated that she is doing well, no problems or concerns.

## 2018-02-17 NOTE — Progress Notes (Signed)
    OBSTETRICS/GYNECOLOGY POST-OPERATIVE CLINIC VISIT  Subjective:     Melissa Mcmahon is a 27 y.o. female who presents to the clinic 2 weeks status post vaginal hysterectomy with bilateral salpingectomy for abnormal uterine bleeding and pelvic pain. Patient had left ureteral kinking during her procedure, and had to have left ureteral stent placement. Eating a regular diet without difficulty. Bowel movements are normal. The patient is not having any pain.  The following portions of the patient's history were reviewed and updated as appropriate: allergies, current medications, past family history, past medical history, past social history, past surgical history and problem list.  Review of Systems Pertinent items noted in HPI and remainder of comprehensive ROS otherwise negative.    Objective:    BP 123/84   Pulse 67   Ht 5\' 3"  (1.6 m)   Wt 147 lb 1.6 oz (66.7 kg)   LMP 01/11/2018 (Exact Date) Comment: tubal ligation  BMI 26.06 kg/m  General:  alert and no distress  Abdomen: soft, bowel sounds active, non-tender  Pelvis:   deferred.     Pathology:  A. UTERUS WITH CERVIX, BILATERAL TUBES:  - CERVIX: MODERATE DYSPLASIA (CIN 2).  ACUTE AND CHRONIC CERVICITIS WITH SQUAMOUS METAPLASIA.   - UTERUS: LINED BY SECRETORY ENDOMETRIUM. NEGATIVE FOR HYPERPLASIA AND  MALIGNANCY.   - FALLOPIAN TUBES: BENIGN PARATUBAL CYSTS (2).  ENDOMETRIOSIS.   Assessment:    Doing well postoperatively.   Plan:   1. Continue any current medications as needed. 2. Wound care discussed. 3. Operative findings again reviewed. Pathology report discussed.  Newly diagnosed endometriosis.  4. Activity restrictions: no bending, stooping, or squatting and no lifting more than 15 pounds 5. Anticipated return to work: 1-2 weeks. Work notice given with restrictions.  6. Follow up: 4 weeks for final post-operative visit.     Hildred Laser, MD [facility]

## 2018-03-15 ENCOUNTER — Ambulatory Visit (INDEPENDENT_AMBULATORY_CARE_PROVIDER_SITE_OTHER): Payer: Commercial Managed Care - PPO

## 2018-03-15 VITALS — BP 149/84 | HR 93

## 2018-03-15 DIAGNOSIS — N201 Calculus of ureter: Secondary | ICD-10-CM

## 2018-03-15 LAB — URINALYSIS, COMPLETE
Bilirubin, UA: NEGATIVE
Glucose, UA: NEGATIVE
Ketones, UA: NEGATIVE
Nitrite, UA: POSITIVE — AB
PH UA: 7 (ref 5.0–7.5)
Specific Gravity, UA: >1.03 — ABNORMAL HIGH (ref 1.005–1.030)
Urobilinogen, Ur: 1 mg/dL (ref 0.2–1.0)

## 2018-03-15 LAB — MICROSCOPIC EXAMINATION: RBC, UA: >30 /hpf — ABNORMAL HIGH (ref 0–2)

## 2018-03-15 MED ORDER — OXYBUTYNIN CHLORIDE 5 MG PO TABS: 5.0000 mg | ORAL_TABLET | Freq: Three times a day (TID) | ORAL | 1 refills | Status: DC

## 2018-03-15 MED ORDER — SULFAMETHOXAZOLE-TRIMETHOPRIM 800-160 MG PO TABS: 1.0000 | ORAL_TABLET | Freq: Two times a day (BID) | ORAL | 0 refills | Status: DC

## 2018-03-15 MED ORDER — TAMSULOSIN HCL 0.4 MG PO CAPS: 0.4000 mg | ORAL_CAPSULE | Freq: Every day | ORAL | 0 refills | Status: DC

## 2018-03-15 NOTE — Progress Notes (Signed)
Patient present today complaining of urinary frequency, urgency, dysuria, and hematuria. Patient states she had a fever of 101.5 over the weekend but does not have one now. Her urine was sent for culture and Per Dr. Richardo Hanks patient was started on Bactrim today and will call with results. Patient was also given tamsulosin and oxybutinin to help with stent discomfort. Patient was also told that she could try AZO to help with dysuria.

## 2018-03-17 LAB — CULTURE, URINE COMPREHENSIVE

## 2018-03-19 ENCOUNTER — Encounter: Payer: Self-pay | Admitting: Urology

## 2018-03-19 ENCOUNTER — Ambulatory Visit (INDEPENDENT_AMBULATORY_CARE_PROVIDER_SITE_OTHER): Payer: Commercial Managed Care - PPO | Admitting: Urology

## 2018-03-19 VITALS — BP 129/78 | HR 75 | Ht 63.0 in | Wt 145.2 lb

## 2018-03-19 DIAGNOSIS — N201 Calculus of ureter: Secondary | ICD-10-CM

## 2018-03-19 DIAGNOSIS — N1339 Other hydronephrosis: Secondary | ICD-10-CM

## 2018-03-19 LAB — URINALYSIS, COMPLETE
BILIRUBIN UA: NEGATIVE
GLUCOSE, UA: NEGATIVE
KETONES UA: NEGATIVE
Nitrite, UA: NEGATIVE
PH UA: 7 (ref 5.0–7.5)
Specific Gravity, UA: 1.02 (ref 1.005–1.030)
UUROB: 0.2 mg/dL (ref 0.2–1.0)

## 2018-03-19 LAB — MICROSCOPIC EXAMINATION: RBC, UA: >30 /hpf — ABNORMAL HIGH (ref 0–2)

## 2018-03-19 MED ORDER — LIDOCAINE HCL URETHRAL/MUCOSAL 2 % EX GEL
1.0000 "application " | Freq: Once | CUTANEOUS | Status: AC
  Administered 2018-03-19: 1 via URETHRAL

## 2018-03-19 NOTE — Progress Notes (Signed)
Indications: Patient is 27 y.o., female who recently developed left hydronephrosis after vaginal hysterectomy.  Cystoscopy with left retrograde pyelogram demonstrated narrowing and kinking of the distal ureter felt most likely secondary to absorbable suture placement.  The patient is presenting today for stent removal.  Procedure:  Flexible Cystoscopy with stent removal ([id number])  Timeout was performed and the correct patient, procedure and participants were identified.    Description:  The patient was prepped and draped in the usual sterile fashion. Flexible cystosopy was performed.  The stent was visualized, grasped, and removed intact without difficulty. The patient tolerated the procedure well.  A single dose of oral antibiotics was given.  Complications:  None  Plan: She was instructed to call for development of flank pain post stent removal.  Will obtain a follow-up renal ultrasound in approximately 6 weeks.

## 2018-03-31 ENCOUNTER — Encounter: Payer: Commercial Managed Care - PPO | Admitting: Obstetrics and Gynecology

## 2018-05-04 ENCOUNTER — Ambulatory Visit: Payer: Commercial Managed Care - PPO

## 2018-05-06 ENCOUNTER — Ambulatory Visit: Payer: Commercial Managed Care - PPO | Admitting: Urology

## 2018-05-06 NOTE — Progress Notes (Incomplete)
05/06/2018 12:47 PM   Melissa Mcmahon 08-07-1990 [id number]  Referring provider: No referring provider defined for this encounter.  No chief complaint on file.   HPI: 28 yo F with a history of left hydronephrosis and vaginal hysterectomy returns today for RUS report.    PMH: Past Medical History:  Diagnosis Date   Acid reflux    Asthma    H/O AS A CHILD-NO INHALERS    Endometriosis    Headache    MIGRAINES-RARE   History of hiatal hernia     Surgical History: Past Surgical History:  Procedure Laterality Date   CHOLECYSTECTOMY N/A 10/07/2017   Procedure: LAPAROSCOPIC CHOLECYSTECTOMY;  Surgeon: Ancil Linsey, MD;  Location: [facility];  Service: General;  Laterality: N/A;   CYSTOSCOPY W/ RETROGRADES Left 02/04/2018   Procedure: CYSTOSCOPY WITH RETROGRADE PYELOGRAM;  Surgeon: Riki Altes, MD;  Location: [facility];  Service: Urology;  Laterality: Left;   CYSTOSCOPY WITH STENT PLACEMENT Left 02/04/2018   Procedure: CYSTOSCOPY WITH STENT PLACEMENT;  Surgeon: Riki Altes, MD;  Location: [facility];  Service: Urology;  Laterality: Left;   FEET SURGERY     KNEE ARTHROSCOPY     LAPAROSCOPY N/A 02/08/2015   Procedure: LAPAROSCOPY DIAGNOSTIC;  Surgeon: Vena Austria, MD;  Location: [facility];  Service: Gynecology;  Laterality: N/A;   TUBAL LIGATION     VAGINAL HYSTERECTOMY Bilateral 02/01/2018   Procedure: HYSTERECTOMY VAGINAL WITH BILATERAL SALPINGECTOMY;  Surgeon: Hildred Laser, MD;  Location: [facility];  Service: Gynecology;  Laterality: Bilateral;    Home Medications:  Allergies as of 05/06/2018      Reactions   Penicillins Hives   Has patient had a PCN reaction causing immediate rash, facial/tongue/throat swelling, SOB or lightheadedness with hypotension: unkn Has patient had a PCN reaction causing severe rash involving mucus membranes or skin necrosis: no Has patient had a PCN reaction that required hospitalization: no Has patient had a PCN reaction  occurring within the last 10 years: no If all of the above answers are "NO", then may proceed with Cephalosporin use.      Medication List       Accurate as of May 06, 2018 12:47 PM. Always use your most recent med list.        omeprazole 20 MG capsule Commonly known as:  PRILOSEC Take 20 mg by mouth every morning.   oxybutynin 5 MG tablet Commonly known as:  DITROPAN Take 1 tablet (5 mg total) by mouth 3 (three) times daily.   sulfamethoxazole-trimethoprim 800-160 MG tablet Commonly known as:  BACTRIM DS,SEPTRA DS Take 1 tablet by mouth every 12 (twelve) hours.   tamsulosin 0.4 MG Caps capsule Commonly known as:  FLOMAX Take 1 capsule (0.4 mg total) by mouth daily.       Allergies:  Allergies  Allergen Reactions   Penicillins Hives    Has patient had a PCN reaction causing immediate rash, facial/tongue/throat swelling, SOB or lightheadedness with hypotension: unkn Has patient had a PCN reaction causing severe rash involving mucus membranes or skin necrosis: no Has patient had a PCN reaction that required hospitalization: no Has patient had a PCN reaction occurring within the last 10 years: no If all of the above answers are "NO", then may proceed with Cephalosporin use.     Family History: Family History  Problem Relation Age of Onset   Diabetes Father    Diabetes Maternal Aunt    Healthy Mother    Breast cancer Neg Hx  Ovarian cancer Neg Hx    Colon cancer Neg Hx     Social History:  reports that she quit smoking about 10 months ago. Her smoking use included e-cigarettes and cigarettes. She has a 7.00 pack-year smoking history. She uses smokeless tobacco. She reports that she does not drink alcohol or use drugs.  ROS:                                        Physical Exam: LMP 01/11/2018 (Exact Date) Comment: tubal ligation  Constitutional:  Alert and oriented, No acute distress. HEENT: [place] AT, moist mucus membranes.   Trachea midline, no masses. Cardiovascular: No clubbing, cyanosis, or edema. Respiratory: Normal respiratory effort, no increased work of breathing. GI: Abdomen is soft, nontender, nondistended, no abdominal masses GU: No CVA tenderness Lymph: No cervical or inguinal lymphadenopathy. Skin: No rashes, bruises or suspicious lesions. Neurologic: Grossly intact, no focal deficits, moving all 4 extremities. Psychiatric: Normal mood and affect.  Laboratory Data: Lab Results  Component Value Date   WBC 7.5 02/07/2018   HGB 13.0 02/07/2018   HCT 39.5 02/07/2018   MCV 90.8 02/07/2018   PLT 307 02/07/2018    Lab Results  Component Value Date   CREATININE 0.74 02/07/2018    No results found for: PSA  No results found for: TESTOSTERONE  No results found for: HGBA1C  Urinalysis    Component Value Date/Time   COLORURINE RED (A) 02/07/2018 1710   APPEARANCEUR Cloudy (A) 03/19/2018 0858   LABSPEC 1.021 02/07/2018 1710   PHURINE  02/07/2018 1710    TEST NOT REPORTED DUE TO COLOR INTERFERENCE OF URINE PIGMENT   GLUCOSEU Negative 03/19/2018 0858   HGBUR (A) 02/07/2018 1710    TEST NOT REPORTED DUE TO COLOR INTERFERENCE OF URINE PIGMENT   BILIRUBINUR Negative 03/19/2018 0858   KETONESUR (A) 02/07/2018 1710    TEST NOT REPORTED DUE TO COLOR INTERFERENCE OF URINE PIGMENT   PROTEINUR 2+ (A) 03/19/2018 0858   PROTEINUR (A) 02/07/2018 1710    TEST NOT REPORTED DUE TO COLOR INTERFERENCE OF URINE PIGMENT   UROBILINOGEN 1.0 07/09/2014 2220   NITRITE Negative 03/19/2018 0858   NITRITE (A) 02/07/2018 1710    TEST NOT REPORTED DUE TO COLOR INTERFERENCE OF URINE PIGMENT   LEUKOCYTESUR 1+ (A) 03/19/2018 0858    Lab Results  Component Value Date   LABMICR See below: 03/19/2018   WBCUA 0-5 03/19/2018   RBCUA >30 (H) 03/19/2018   LABEPIT 0-10 03/19/2018   MUCUS Present (A) 03/19/2018   BACTERIA Moderate (A) 03/19/2018    Pertinent Imaging: *** No results found for this or any previous  visit. No results found for this or any previous visit. No results found for this or any previous visit. No results found for this or any previous visit. Results for orders placed during the hospital encounter of 02/07/18  US Renal   Narrative CLINICAL DATA:  LEFT flank pain, recent LEFT ureteral stent placement 3 days ago, dysuria, weak urine stream  EXAM: RENAL / URINARY TRACT ULTRASOUND COMPLETE  COMPARISON:  CT abdomen and pelvis 02/03/2018  FINDINGS: Right Kidney:  Length: 10.6 cm. Normal cortical thickness and echogenicity. No mass, hydronephrosis or shadowing calcification.  Left Kidney:  Length: 13.0 cm. Normal cortical thickness. Borderline increased cortical echogenicity. No mass or hydronephrosis. No ureteral stent is definitely visualized.  Bladder:  Contains minimal urine, inadequately evaluated  IMPRESSION: No evidence of renal mass or hydronephrosis.   Electronically Signed   By: Ulyses Southward M.D.   On: 02/07/2018 17:37    No results found for this or any previous visit. No results found for this or any previous visit. No results found for this or any previous visit.  Assessment & Plan:   1. Left hydronephrosis  -  No follow-ups on file.  [facility] [address] [place], [place] [address] [phone]  I, Donne Hazel, am acting as a scribe for Dr. Lorin Picket C. Stoioff,  {Add Holiday representative

## 2018-05-19 ENCOUNTER — Encounter: Payer: Self-pay | Admitting: Obstetrics and Gynecology

## 2018-05-19 ENCOUNTER — Ambulatory Visit: Payer: Commercial Managed Care - PPO | Admitting: Obstetrics and Gynecology

## 2018-05-19 VITALS — BP 133/75 | HR 80 | Ht 63.0 in | Wt 153.0 lb

## 2018-05-19 DIAGNOSIS — L739 Follicular disorder, unspecified: Secondary | ICD-10-CM | POA: Diagnosis not present

## 2018-05-19 MED ORDER — SULFAMETHOXAZOLE-TRIMETHOPRIM 800-160 MG PO TABS: 1.0000 | ORAL_TABLET | Freq: Two times a day (BID) | ORAL | 1 refills | Status: DC

## 2018-05-19 NOTE — Progress Notes (Signed)
    GYNECOLOGY PROGRESS NOTE  Subjective:    Patient ID: Melissa Mcmahon, female    DOB: 12/14/1990, 28 y.o.   MRN: [id number]  HPI  Patient is a 28 y.o. G1P2002 female who presents for complaints of a bump growing on inner area of her thigh yesterday.  Notes that the lesion almost tripled in size since last night, became very tender.  Was at work and noticed that it was becoming very difficult to walk or stand due to the pain from the lesion.    The following portions of the patient's history were reviewed and updated as appropriate: allergies, current medications, past family history, past medical history, past social history, past surgical history and problem list.  Review of Systems Pertinent items noted in HPI and remainder of comprehensive ROS otherwise negative.   Objective:   Blood pressure 133/75, pulse 80, height 5\' 3"  (1.6 m), weight 153 lb (69.4 kg), last menstrual period 01/11/2018. General appearance: alert and no distress Pelvic: external genitalia with moderate area of erythema on right side of vulva with tenderness noted at site of lesion, ~ 1 x 1.5 cm.   Assessment:   Folliculitis   Plan:   - I&D performed on area of folliculitis. Advised on warm compresses at least BID to the area over the next several days. Will also prescribe Bactrim. Culture performed on evacuated fluid.  - Advised to return in 3-5 days if symptoms worsen or do not improve, or if fever/chills ensue despite antibiotic use.  - Patient requests note for missing work today. Notice given.    Hildred Laser, MD [facility]

## 2018-05-19 NOTE — Progress Notes (Signed)
Pt stated that she noticed a bump on the inner area of her leg yesterday. Pt stated that the area is painful and is making it hard to walk.

## 2018-05-19 NOTE — Patient Instructions (Signed)

## 2018-05-20 ENCOUNTER — Encounter: Payer: Self-pay | Admitting: Obstetrics and Gynecology

## 2018-05-25 LAB — ANAEROBIC AND AEROBIC CULTURE

## 2018-08-24 ENCOUNTER — Other Ambulatory Visit
Admission: RE | Admit: 2018-08-24 | Discharge: 2018-08-24 | Disposition: A | Discharge status: Home / Self Care | Payer: Commercial Managed Care - PPO | Attending: Family Medicine | Admitting: Family Medicine

## 2018-08-24 NOTE — ED Notes (Signed)
Blood draw performed to right [facility] for [place] per this RN.

## 2018-09-10 ENCOUNTER — Encounter: Payer: Self-pay | Admitting: Emergency Medicine

## 2018-09-10 ENCOUNTER — Emergency Department: Payer: Self-pay

## 2018-09-10 ENCOUNTER — Other Ambulatory Visit: Payer: Self-pay

## 2018-09-10 ENCOUNTER — Emergency Department
Admission: EM | Admit: 2018-09-10 | Discharge: 2018-09-10 | Disposition: A | Discharge status: Home / Self Care | Payer: Self-pay | Attending: Emergency Medicine | Admitting: Emergency Medicine

## 2018-09-10 DIAGNOSIS — F17220 Nicotine dependence, chewing tobacco, uncomplicated: Secondary | ICD-10-CM | POA: Insufficient documentation

## 2018-09-10 DIAGNOSIS — J45909 Unspecified asthma, uncomplicated: Secondary | ICD-10-CM | POA: Insufficient documentation

## 2018-09-10 DIAGNOSIS — Z79899 Other long term (current) drug therapy: Secondary | ICD-10-CM | POA: Insufficient documentation

## 2018-09-10 DIAGNOSIS — R1011 Right upper quadrant pain: Secondary | ICD-10-CM | POA: Insufficient documentation

## 2018-09-10 LAB — CBC
HCT: 42.6 % (ref 36.0–46.0)
Hemoglobin: 14.5 g/dL (ref 12.0–15.0)
MCH: 30.7 pg (ref 26.0–34.0)
MCHC: 34 g/dL (ref 30.0–36.0)
MCV: 90.1 fL (ref 80.0–100.0)
Platelets: 277 10*3/uL (ref 150–400)
RBC: 4.73 MIL/uL (ref 3.87–5.11)
RDW: 12.6 % (ref 11.5–15.5)
WBC: 4.9 10*3/uL (ref 4.0–10.5)
nRBC: 0 % (ref 0.0–0.2)

## 2018-09-10 LAB — COMPREHENSIVE METABOLIC PANEL
ALT: 11 U/L (ref 0–44)
AST: 13 U/L — ABNORMAL LOW (ref 15–41)
Albumin: 4.5 g/dL (ref 3.5–5.0)
Alkaline Phosphatase: 58 U/L (ref 38–126)
Anion gap: 7 (ref 5–15)
BUN: 14 mg/dL (ref 6–20)
CO2: 25 mmol/L (ref 22–32)
Calcium: 9.3 mg/dL (ref 8.9–10.3)
Chloride: 107 mmol/L (ref 98–111)
Creatinine, Ser: 0.76 mg/dL (ref 0.44–1.00)
GFR calc Af Amer: >60 mL/min (ref >60)
GFR calc non Af Amer: >60 mL/min (ref >60)
Glucose, Bld: 85 mg/dL (ref 70–99)
Potassium: 3.8 mmol/L (ref 3.5–5.1)
Sodium: 139 mmol/L (ref 135–145)
Total Bilirubin: 1 mg/dL (ref 0.3–1.2)
Total Protein: 7.5 g/dL (ref 6.5–8.1)

## 2018-09-10 LAB — URINALYSIS, COMPLETE (UACMP) WITH MICROSCOPIC
Bacteria, UA: NONE SEEN
Bilirubin Urine: NEGATIVE
Glucose, UA: NEGATIVE mg/dL
Hgb urine dipstick: NEGATIVE
Ketones, ur: NEGATIVE mg/dL
Leukocytes,Ua: NEGATIVE
Nitrite: NEGATIVE
Protein, ur: NEGATIVE mg/dL
Specific Gravity, Urine: 1.02 (ref 1.005–1.030)
pH: 6 (ref 5.0–8.0)

## 2018-09-10 LAB — POCT PREGNANCY, URINE: Preg Test, Ur: NEGATIVE

## 2018-09-10 LAB — LIPASE, BLOOD: Lipase: 76 U/L — ABNORMAL HIGH (ref 11–51)

## 2018-09-10 MED ORDER — SODIUM CHLORIDE 0.9 % IV BOLUS
1000.0000 mL | Freq: Once | INTRAVENOUS | Status: AC
  Administered 2018-09-10: 1000 mL via INTRAVENOUS

## 2018-09-10 MED ORDER — ALUM & MAG HYDROXIDE-SIMETH 200-200-20 MG/5ML PO SUSP
30.0000 mL | Freq: Once | ORAL | Status: AC
  Administered 2018-09-10: 15:00:00 30 mL via ORAL
  Filled 2018-09-10: qty 30

## 2018-09-10 MED ORDER — KETOROLAC TROMETHAMINE 30 MG/ML IJ SOLN
15.0000 mg | INTRAMUSCULAR | Status: AC
  Administered 2018-09-10: 15 mg via INTRAVENOUS
  Filled 2018-09-10: qty 1

## 2018-09-10 MED ORDER — FAMOTIDINE 20 MG PO TABS: 20.0000 mg | ORAL_TABLET | Freq: Two times a day (BID) | ORAL | 0 refills | Status: DC

## 2018-09-10 MED ORDER — FAMOTIDINE 20 MG PO TABS: 40.0000 mg | ORAL_TABLET | Freq: Once | ORAL | Status: DC

## 2018-09-10 MED ORDER — ONDANSETRON HCL 4 MG/2ML IJ SOLN
4.0000 mg | Freq: Once | INTRAMUSCULAR | Status: AC
  Administered 2018-09-10: 4 mg via INTRAVENOUS
  Filled 2018-09-10: qty 2

## 2018-09-10 MED ORDER — IOHEXOL 300 MG/ML  SOLN
100.0000 mL | Freq: Once | INTRAMUSCULAR | Status: AC | PRN
  Administered 2018-09-10: 100 mL via INTRAVENOUS

## 2018-09-10 MED ORDER — ALUMINUM-MAGNESIUM-SIMETHICONE 200-200-20 MG/5ML PO SUSP: 30.0000 mL | Freq: Three times a day (TID) | ORAL | 0 refills | Status: DC

## 2018-09-10 MED ORDER — FAMOTIDINE IN NACL 20-0.9 MG/50ML-% IV SOLN
20.0000 mg | Freq: Once | INTRAVENOUS | Status: AC
  Administered 2018-09-10: 20 mg via INTRAVENOUS
  Filled 2018-09-10: qty 50

## 2018-09-10 NOTE — ED Notes (Signed)

## 2018-09-10 NOTE — ED Triage Notes (Signed)
Patient presents to the ED with right upper quadrant abdominal pain/tenderness  Patient states pain is worse with coughing, sneezing and sitting.  Patient states she had her galbladder out within the past year.  Patient states she has felt nauseous for the past 2 days.  Patient states she has had minor, occasional pain x 4 months but pain has been much worse in the past week.

## 2018-09-10 NOTE — ED Provider Notes (Signed)
[facility] Emergency Department Provider Note  ____________________________________________  Time seen: Approximately 2:28 PM  I have reviewed the triage vital signs and the nursing notes.   HISTORY  Chief Complaint Abdominal Pain    HPI Melissa Mcmahon is a 28 y.o. female with a history of GERD, endometriosis, status post cholecystectomy from 1 year ago who complains of right upper quadrant abdominal pain, gradual onset, waxing and waning, nonradiating, worsening over the past 2 days.  Complains of nausea but no vomiting.  No constipation.  No fevers chills chest pain or shortness of breath.  No sick contacts.      Past Medical History:  Diagnosis Date  . Acid reflux   . Asthma    H/O AS A CHILD-NO INHALERS   . Endometriosis   . Headache    MIGRAINES-RARE  . History of hiatal hernia      Patient Active Problem List   Diagnosis Date Noted  . Endometriosis 02/17/2018  . S/P vaginal hysterectomy 02/02/2018  . Symptomatic cholelithiasis 10/02/2017     Past Surgical History:  Procedure Laterality Date  . CHOLECYSTECTOMY N/A 10/07/2017   Procedure: LAPAROSCOPIC CHOLECYSTECTOMY;  Surgeon: Ancil Linsey, MD;  Location: [facility] ORS;  Service: General;  Laterality: N/A;  . CYSTOSCOPY W/ RETROGRADES Left 02/04/2018   Procedure: CYSTOSCOPY WITH RETROGRADE PYELOGRAM;  Surgeon: Riki Altes, MD;  Location: [facility] ORS;  Service: Urology;  Laterality: Left;  . CYSTOSCOPY WITH STENT PLACEMENT Left 02/04/2018   Procedure: CYSTOSCOPY WITH STENT PLACEMENT;  Surgeon: Riki Altes, MD;  Location: [facility] ORS;  Service: Urology;  Laterality: Left;  . FEET SURGERY    . KNEE ARTHROSCOPY    . LAPAROSCOPY N/A 02/08/2015   Procedure: LAPAROSCOPY DIAGNOSTIC;  Surgeon: Vena Austria, MD;  Location: [facility] ORS;  Service: Gynecology;  Laterality: N/A;  . TUBAL LIGATION    . VAGINAL HYSTERECTOMY Bilateral 02/01/2018   Procedure: HYSTERECTOMY VAGINAL WITH BILATERAL  SALPINGECTOMY;  Surgeon: Hildred Laser, MD;  Location: [facility] ORS;  Service: Gynecology;  Laterality: Bilateral;     Prior to Admission medications   Medication Sig Start Date End Date Taking? Authorizing Provider  aluminum-magnesium hydroxide-simethicone (MAALOX) 200-200-20 MG/5ML SUSP Take 30 mLs by mouth 4 (four) times daily -  before meals and at bedtime. 09/10/18   Sharman Cheek, MD  famotidine (PEPCID) 20 MG tablet Take 1 tablet (20 mg total) by mouth 2 (two) times daily. 09/10/18   Sharman Cheek, MD  omeprazole (PRILOSEC) 20 MG capsule Take 20 mg by mouth every morning.     [provider]  sulfamethoxazole-trimethoprim (BACTRIM DS,SEPTRA DS) 800-160 MG tablet Take 1 tablet by mouth 2 (two) times daily. 05/19/18   Hildred Laser, MD     Allergies Penicillins   Family History  Problem Relation Age of Onset  . Diabetes Father   . Diabetes Maternal Aunt   . Healthy Mother   . Breast cancer Neg Hx   . Ovarian cancer Neg Hx   . Colon cancer Neg Hx     Social History Social History   Tobacco Use  . Smoking status: Former Smoker    Packs/day: 1.00    Years: 7.00    Pack years: 7.00    Types: E-cigarettes, Cigarettes    Last attempt to quit: 06/07/2017    Years since quitting: 1.2  . Smokeless tobacco: Current User  Substance Use Topics  . Alcohol use: No  . Drug use: No    Review of Systems  Constitutional:  No fever or chills.  ENT:   No sore throat. No rhinorrhea. Cardiovascular:   No chest pain or syncope. Respiratory:   No dyspnea or cough. Gastrointestinal:   Positive as above for abdominal pain without vomiting and diarrhea.  Musculoskeletal:   Negative for focal pain or swelling All other systems reviewed and are negative except as documented above in ROS and HPI.  ____________________________________________   PHYSICAL EXAM:  VITAL SIGNS: ED Triage Vitals [09/10/18 1117]  Enc Vitals Group     BP (!) 139/91     Pulse Rate 66     Resp 16      Temp 98.5 F (36.9 C)     Temp Source Oral     SpO2 100 %     Weight 140 lb (63.5 kg)     Height 5\' 3"  (1.6 m)     Head Circumference      Peak Flow      Pain Score 5     Pain Loc      Pain Edu?      Excl. in GC?     Vital signs reviewed, nursing assessments reviewed.   Constitutional:   Alert and oriented. Non-toxic appearance. Eyes:   Conjunctivae are normal. EOMI. PERRL. ENT      Head:   Normocephalic and atraumatic.      Nose:   No congestion/rhinnorhea.       Mouth/Throat:   MMM, no pharyngeal erythema. No peritonsillar mass.       Neck:   No meningismus. Full ROM. Hematological/Lymphatic/Immunilogical:   No cervical lymphadenopathy. Cardiovascular:   RRR. Symmetric bilateral radial and DP pulses.  No murmurs. Cap refill less than 2 seconds. Respiratory:   Normal respiratory effort without tachypnea/retractions. Breath sounds are clear and equal bilaterally. No wheezes/rales/rhonchi. Gastrointestinal:   Soft with right upper quadrant tenderness. Non distended. There is no CVA tenderness.  No rebound, rigidity, or guarding. Musculoskeletal:   Normal range of motion in all extremities. No joint effusions.  No lower extremity tenderness.  No edema. Neurologic:   Normal speech and language.  Motor grossly intact. No acute focal neurologic deficits are appreciated.  Skin:    Skin is warm, dry and intact. No rash noted.  No petechiae, purpura, or bullae.  ____________________________________________    LABS (pertinent positives/negatives) (all labs ordered are listed, but only abnormal results are displayed) Labs Reviewed  LIPASE, BLOOD - Abnormal; Notable for the following components:      Result Value   Lipase 76 (*)    All other components within normal limits  COMPREHENSIVE METABOLIC PANEL - Abnormal; Notable for the following components:   AST 13 (*)    All other components within normal limits  URINALYSIS, COMPLETE (UACMP) WITH MICROSCOPIC - Abnormal; Notable  for the following components:   Color, Urine YELLOW (*)    APPearance CLOUDY (*)    All other components within normal limits  CBC  POC URINE PREG, ED  POCT PREGNANCY, URINE   ____________________________________________   EKG    ____________________________________________    RADIOLOGY  Ct Abdomen Pelvis W Contrast  Result Date: 09/10/2018 CLINICAL DATA:  Elevated lipase. Abdominal pain. Previous cholecystectomy. EXAM: CT ABDOMEN AND PELVIS WITH CONTRAST TECHNIQUE: Multidetector CT imaging of the abdomen and pelvis was performed using the standard protocol following bolus administration of intravenous contrast. CONTRAST:  100mL OMNIPAQUE IOHEXOL 300 MG/ML  SOLN COMPARISON:  02/07/2018 FINDINGS: Lower chest: Normal Hepatobiliary: Previous cholecystectomy. Liver parenchyma appears normal. No ductal dilatation.  No evidence of ductal system stone. Pancreas: Normal.  No CT evidence of pancreatitis. Spleen: Normal Adrenals/Urinary Tract: Adrenal glands are normal. Kidneys are normal. Bladder is normal. Stomach/Bowel: No abnormal bowel finding. No obstruction. No inflammatory changes. Normal appearing appendix. Vascular/Lymphatic: Normal Reproductive: Previous hysterectomy.  No pelvic mass. Other: No free fluid or air. Musculoskeletal: Normal IMPRESSION: No acute finding. Previous cholecystectomy. Previous hysterectomy. No imaging evidence of pancreatitis. Electronically Signed   By: Paulina Fusi M.D.   On: 09/10/2018 13:09    ____________________________________________   PROCEDURES Procedures  ____________________________________________  DIFFERENTIAL DIAGNOSIS   Remnant cystic duct, pancreatitis, colitis, referred pain from gastritis, postsurgical scar tissue, intestinal gas  CLINICAL IMPRESSION / ASSESSMENT AND PLAN / ED COURSE  Medications ordered in the ED: Medications  alum & mag hydroxide-simeth (MAALOX/MYLANTA) 200-200-20 MG/5ML suspension 30 mL (has no administration in  time range)  sodium chloride 0.9 % bolus 1,000 mL (1,000 mLs Intravenous New Bag/Given 09/10/18 1234)  ketorolac (TORADOL) 30 MG/ML injection 15 mg (15 mg Intravenous Given 09/10/18 1235)  ondansetron (ZOFRAN) injection 4 mg (4 mg Intravenous Given 09/10/18 1234)  famotidine (PEPCID) IVPB 20 mg premix (0 mg Intravenous Stopped 09/10/18 1305)  iohexol (OMNIPAQUE) 300 MG/ML solution 100 mL (100 mLs Intravenous Contrast Given 09/10/18 1257)    Pertinent labs & imaging results that were available during my care of the patient were reviewed by me and considered in my medical decision making (see chart for details).  Melissa Mcmahon was evaluated in Emergency Department on 09/10/2018 for the symptoms described in the history of present illness. She was evaluated in the context of the global COVID-19 pandemic, which necessitated consideration that the patient might be at risk for infection with the SARS-CoV-2 virus that causes COVID-19. Institutional protocols and algorithms that pertain to the evaluation of patients at risk for COVID-19 are in a state of rapid change based on information released by regulatory bodies including the CDC and federal and state organizations. These policies and algorithms were followed during the patient's care in the ED.   Patient presents with right upper quadrant abdominal pain and tenderness.  She is already had a cholecystectomy.  I reviewed the operative note from a year ago and the procedure is described as being completely uncomplicated.  Would not expect a surgical complication nearly a year afterward.  Unclear the cause of her symptoms with the focal tenderness in that area and severity, CT obtained.  This was unremarkable.  Labs unremarkable.  Reassessed patient she states  she feels a little bit better but wishes to be discharged now.  I will continue her on an acids for now presuming gastritis.  Doubt STI PID TOA torsion or Fitz-Hugh Curtis.  Stable for outpatient follow-up.       ____________________________________________   FINAL CLINICAL IMPRESSION(S) / ED DIAGNOSES    Final diagnoses:  Right upper quadrant abdominal pain     ED Discharge Orders         Ordered    aluminum-magnesium hydroxide-simethicone (MAALOX) 200-200-20 MG/5ML SUSP  3 times daily before meals & bedtime     09/10/18 1422    famotidine (PEPCID) 20 MG tablet  2 times daily     09/10/18 1422          Portions of this note were generated with dragon dictation software. Dictation errors may occur despite best attempts at proofreading.   Sharman Cheek, MD 09/10/18 1432

## 2018-12-07 ENCOUNTER — Ambulatory Visit: Payer: Self-pay | Admitting: General Surgery

## 2018-12-07 ENCOUNTER — Encounter: Payer: Self-pay | Admitting: General Surgery

## 2018-12-07 ENCOUNTER — Other Ambulatory Visit: Payer: Self-pay

## 2018-12-07 VITALS — BP 144/90 | HR 80 | Temp 97.9°F | Wt 134.8 lb

## 2018-12-07 DIAGNOSIS — R1011 Right upper quadrant pain: Secondary | ICD-10-CM

## 2018-12-08 NOTE — Progress Notes (Signed)
Patient ID: Melissa Mcmahon, female   DOB: [birth year], 28 y.o.   MRN: [id number]  Chief Complaint  Patient presents with  . Follow-up    abdominal pain    HPI Melissa Mcmahon is a 28 y.o. female.   She is a 28 year old woman who is gallbladder was removed by 1 of our former associates, Dr. Satira MccallumJason Davis, in June 2019.  She contacted our office stating that she had more right upper quadrant pain and wanted to be seen.  She states that it is sometimes so bad, that she curls up on the floor and cries.  She also complains of feeling hungry even after she eats.  She says the pain is most commonly like a "stitch in her side," and most frequently occurs with coughing or sneezing.  She was recently seen in the emergency department for similar symptoms.  There was no identified etiology.  She [facility], whom she has seen in the past.  They determined that she had no GI concerns via telephone.  She therefore contacted us and made this appointment.  She is intermittently tearful throughout the visit.  She is accompanied by her mother.   Past Medical History:  Diagnosis Date  . Acid reflux   . Asthma    H/O AS A CHILD-NO INHALERS   . Endometriosis   . Headache    MIGRAINES-RARE  . History of hiatal hernia     Past Surgical History:  Procedure Laterality Date  . CHOLECYSTECTOMY N/A 10/07/2017   Procedure: LAPAROSCOPIC CHOLECYSTECTOMY;  Surgeon: Ancil Linseyavis, Jason Evan, MD;  Location: [facility] ORS;  Service: General;  Laterality: N/A;  . CYSTOSCOPY W/ RETROGRADES Left 02/04/2018   Procedure: CYSTOSCOPY WITH RETROGRADE PYELOGRAM;  Surgeon: Riki AltesStoioff, Scott C, MD;  Location: [facility] OR[facility];  Service: Urology;  Laterality: Left;  . CYSTOSCOPY WITH STENT PLACEMENT Left 02/04/2018   Procedure: CYSTOSCOPY WITH STENT PLACEMENT;  Surgeon: Riki AltesStoioff, Scott C, MD;  Location: [facility] OR[facility];  Service: Urology;  Laterality: Left;  . FEET SURGERY    . KNEE ARTHROSCOPY    . LAPAROSCOPY N/A  02/08/2015   Procedure: LAPAROSCOPY DIAGNOSTIC;  Surgeon: Vena AustriaAndreas Staebler, MD;  Location: [facility] OR[facility];  Service: Gynecology;  Laterality: N/A;  . TUBAL LIGATION    . VAGINAL HYSTERECTOMY Bilateral 02/01/2018   Procedure: HYSTERECTOMY VAGINAL WITH BILATERAL SALPINGECTOMY;  Surgeon: Hildred Laserherry, Anika, MD;  Location: [facility] ORS;  Service: Gynecology;  Laterality: Bilateral;    Family History  Problem Relation Age of Onset  . Diabetes Father   . Diabetes Maternal Aunt   . Healthy Mother   . Breast cancer Neg Hx   . Ovarian cancer Neg Hx   . Colon cancer Neg Hx     Social History Social History   Tobacco Use  . Smoking status: Former Smoker    Packs/day: 1.00    Years: 7.00    Pack years: 7.00    Types: E-cigarettes, Cigarettes    Quit date: 06/07/2017    Years since quitting: 1.5  . Smokeless tobacco: Current User  Substance Use Topics  . Alcohol use: No  . Drug use: No    Allergies  Allergen Reactions  . Penicillins Hives    Has patient had a PCN reaction causing immediate rash, facial/tongue/throat swelling, SOB or lightheadedness with hypotension: unkn Has patient had a PCN reaction causing severe rash involving mucus membranes or skin necrosis: no Has patient had a PCN reaction that required hospitalization: no Has patient had a  PCN reaction occurring within the last 10 years: no If all of the above answers are "NO", then may proceed with Cephalosporin use.     Current Outpatient Medications  Medication Sig Dispense Refill  . omeprazole (PRILOSEC) 20 MG capsule Take 20 mg by mouth every morning.      No current facility-administered medications for this visit.     Review of Systems Review of Systems  Constitutional: Positive for appetite change.  Gastrointestinal: Positive for abdominal pain, nausea and vomiting.  Hematological: Bruises/bleeds easily.  All other systems reviewed and are negative.   Blood pressure (!) 144/90, pulse 80, temperature 97.9 F (36.6 C),  weight 134 lb 12.8 oz (61.1 kg), last menstrual period 01/11/2018, SpO2 98 %.  Physical Exam Physical Exam Constitutional:      Appearance: She is normal weight.     Comments: Intermittently crying  HENT:     Head: Normocephalic and atraumatic.     Nose:     Comments: Wearing a mask secondary to COVID-19 precautions    Mouth/Throat:     Comments: Wearing a mask secondary to COVID-19 precautions Eyes:     General: No scleral icterus.       Right eye: No discharge.        Left eye: No discharge.  Neck:     Musculoskeletal: Normal range of motion.  Cardiovascular:     Rate and Rhythm: Normal rate and regular rhythm.     Heart sounds: No murmur.  Pulmonary:     Effort: Pulmonary effort is normal.     Breath sounds: Normal breath sounds.  Abdominal:     General: Abdomen is flat. Bowel sounds are normal.     Palpations: Abdomen is soft.     Tenderness: There is abdominal tenderness.     Comments: Tender to palpation in the right upper quadrant.  Genitourinary:    Comments: Deferred Musculoskeletal:     Right lower leg: No edema.     Left lower leg: No edema.  Lymphadenopathy:     Cervical: No cervical adenopathy.  Skin:    General: Skin is warm and dry.  Neurological:     General: No focal deficit present.     Mental Status: She is alert.     Data Reviewed I reviewed the patient's electronic medical record extensively.  This includes records dating back to 2018.  There were a number of presentations to the emergency department at various hospitals for abdominal pain, nausea, and vomiting.  Her initial visit with gastroenterology, dated 01/29/2017 resulted in an upper endoscopy, that showed class a esophagitis. Subsequent testing ordered by gastroenterology included a right upper quadrant ultrasound and HIDA scan, both of which were negative.   I reviewed Dr. Jinny Sandersavis's initial history and physical dated Oct 02, 2017.  She again, was quite tearful according to his note.  His  operative note was similarly reviewed, and reveals an uncomplicated procedure.  OB/GYN notes were also reviewed.  She apparently had a partial hysterectomy for endometriosis.  This was done in September 2019.  I reviewed her labs and imaging from her most recent emergency department visit:  Results for Melissa Mcmahon, Kahlee E (MRN [id number]) as of 12/08/2018 10:39  Ref. Range 09/10/2018 11:19  Sodium Latest Ref Range: 135 - 145 mmol/L 139  Potassium Latest Ref Range: 3.5 - 5.1 mmol/L 3.8  Chloride Latest Ref Range: 98 - 111 mmol/L 107  CO2 Latest Ref Range: 22 - 32 mmol/L 25  Glucose Latest Ref Range:  70 - 99 mg/dL 85  BUN Latest Ref Range: 6 - 20 mg/dL 14  Creatinine Latest Ref Range: 0.44 - 1.00 mg/dL 1.610.76  Calcium Latest Ref Range: 8.9 - 10.3 mg/dL 9.3  Anion gap Latest Ref Range: 5 - 15  7  Alkaline Phosphatase Latest Ref Range: 38 - 126 U/L 58  Albumin Latest Ref Range: 3.5 - 5.0 g/dL 4.5  Lipase Latest Ref Range: 11 - 51 U/L 76 (H)  AST Latest Ref Range: 15 - 41 U/L 13 (L)  ALT Latest Ref Range: 0 - 44 U/L 11  Total Protein Latest Ref Range: 6.5 - 8.1 g/dL 7.5  Total Bilirubin Latest Ref Range: 0.3 - 1.2 mg/dL 1.0  GFR, Est Non African American Latest Ref Range: >60 mL/min >60  GFR, Est African American Latest Ref Range: >60 mL/min >60  WBC Latest Ref Range: 4.0 - 10.5 K/uL 4.9  RBC Latest Ref Range: 3.87 - 5.11 MIL/uL 4.73  Hemoglobin Latest Ref Range: 12.0 - 15.0 g/dL 09.614.5  HCT Latest Ref Range: 36.0 - 46.0 % 42.6  MCV Latest Ref Range: 80.0 - 100.0 fL 90.1  MCH Latest Ref Range: 26.0 - 34.0 pg 30.7  MCHC Latest Ref Range: 30.0 - 36.0 g/dL 04.534.0  RDW Latest Ref Range: 11.5 - 15.5 % 12.6  Platelets Latest Ref Range: 150 - 400 K/uL 277  nRBC Latest Ref Range: 0.0 - 0.2 % 0.0   These do show a mild elevation in her lipase, which has been present on previous studies as well, but spontaneously resolves.  She had right upper quadrant ultrasounds performed on both May 28 and Oct 01, 2017  when she presented to the emergency department with similar symptoms.  As her gallbladder was removed in the interim, a CT scan was done at her visit dated 10 Sep 2018.  I personally reviewed the imaging and have copied to the radiologist report here:  CLINICAL DATA:  Elevated lipase. Abdominal pain. Previous cholecystectomy.  EXAM: CT ABDOMEN AND PELVIS WITH CONTRAST  TECHNIQUE: Multidetector CT imaging of the abdomen and pelvis was performed using the standard protocol following bolus administration of intravenous contrast.  CONTRAST:  100mL OMNIPAQUE IOHEXOL 300 MG/ML  SOLN  COMPARISON:  02/07/2018  FINDINGS: Lower chest: Normal  Hepatobiliary: Previous cholecystectomy. Liver parenchyma appears normal. No ductal dilatation. No evidence of ductal system stone.  Pancreas: Normal.  No CT evidence of pancreatitis.  Spleen: Normal  Adrenals/Urinary Tract: Adrenal glands are normal. Kidneys are normal. Bladder is normal.  Stomach/Bowel: No abnormal bowel finding. No obstruction. No inflammatory changes. Normal appearing appendix.  Vascular/Lymphatic: Normal  Reproductive: Previous hysterectomy.  No pelvic mass.  Other: No free fluid or air.  Musculoskeletal: Normal  IMPRESSION: No acute finding. Previous cholecystectomy. Previous hysterectomy. No imaging evidence of pancreatitis.  Assessment This is a [age] year old woman with a multiyear history of abdominal pain, nausea, vomiting, status post multiple evaluations and interventions.  She did have her gallbladder removed just over a year ago, but continues to complain of the same.  Unfortunately, no etiology has been definitively identified for why she might be having these issues.  There does not seem to be a surgical component to her pain.  I spent nearly an hour (55 minutes, greater than 50% of which was spent in counseling and coordination of patient care)) with Ms. Zachery DauerBarnes and her mother discussing the  reasons why I would not be able to provide a surgical solution to her discomfort.  They asked for possible  explanations, however I did not feel I had an adequate response.  I encouraged Ms. Ruddell to obtain a primary care provider.  She says that she does not have one because she does not have insurance.  I simply do not feel that general surgery is the appropriate place to begin an investigation into a longstanding issue.  I reiterated my recommendation for her to seek out a a primary care provider to help her conserve her limited resources and make the best use of her time by making more appropriate referrals.  Plan No follow-up was made.    Fredirick Maudlin 12/08/2018, 10:02 AM

## 2018-12-10 ENCOUNTER — Telehealth: Payer: Self-pay | Admitting: Obstetrics and Gynecology

## 2018-12-10 NOTE — Telephone Encounter (Signed)
Yes, if she sees her PCP and they deem that it is more GYN related, we will work on trying to get her further evaluated as cost efficiently as possible.

## 2018-12-10 NOTE — Telephone Encounter (Signed)
The patient called and stated that she is having some issues with her hysterectomy and endometriosis. The patient wants to speak with a nurse or provider as soon as possible. Please advise.

## 2018-12-10 NOTE — Telephone Encounter (Signed)
Pt was called and spoke to her concerning her call to the office. Pt stated that she was having side pains in the same area she was having when she had gallbladder issues. Pt was asking if it could have been from her having an hysterectomy or her ovaries. Pt was advised that she needed to be seen by [facility]. Pt stated that she do not have insurance at this time. Pt was informed that it would be hard to know what is going on without physical seeing her. Pt voiced that she understood. Pt stated that she has an appointment with her PCP and she would speak them him about it. Pt was advised that if she needed to see [facility] to please call and schedule an appointment. Pt stated that she understood.

## 2018-12-17 ENCOUNTER — Telehealth: Payer: Self-pay

## 2018-12-21 NOTE — Telephone Encounter (Signed)
Pt was informed of the information given by [facility] and pt has an appointment to see [facility].

## 2018-12-27 ENCOUNTER — Emergency Department: Payer: Self-pay

## 2018-12-27 ENCOUNTER — Other Ambulatory Visit: Payer: Self-pay

## 2018-12-27 ENCOUNTER — Encounter: Payer: Self-pay | Admitting: Emergency Medicine

## 2018-12-27 ENCOUNTER — Emergency Department
Admission: EM | Admit: 2018-12-27 | Discharge: 2018-12-27 | Disposition: A | Discharge status: Home / Self Care | Payer: Self-pay | Attending: Emergency Medicine | Admitting: Emergency Medicine

## 2018-12-27 DIAGNOSIS — K449 Diaphragmatic hernia without obstruction or gangrene: Secondary | ICD-10-CM | POA: Insufficient documentation

## 2018-12-27 DIAGNOSIS — R1011 Right upper quadrant pain: Secondary | ICD-10-CM | POA: Insufficient documentation

## 2018-12-27 DIAGNOSIS — Z8742 Personal history of other diseases of the female genital tract: Secondary | ICD-10-CM | POA: Insufficient documentation

## 2018-12-27 DIAGNOSIS — R109 Unspecified abdominal pain: Secondary | ICD-10-CM

## 2018-12-27 DIAGNOSIS — G8929 Other chronic pain: Secondary | ICD-10-CM | POA: Insufficient documentation

## 2018-12-27 DIAGNOSIS — Z90710 Acquired absence of both cervix and uterus: Secondary | ICD-10-CM | POA: Insufficient documentation

## 2018-12-27 DIAGNOSIS — Z79899 Other long term (current) drug therapy: Secondary | ICD-10-CM | POA: Insufficient documentation

## 2018-12-27 DIAGNOSIS — Z87891 Personal history of nicotine dependence: Secondary | ICD-10-CM | POA: Insufficient documentation

## 2018-12-27 LAB — COMPREHENSIVE METABOLIC PANEL
ALT: 13 U/L (ref 0–44)
AST: 18 U/L (ref 15–41)
Albumin: 4 g/dL (ref 3.5–5.0)
Alkaline Phosphatase: 51 U/L (ref 38–126)
Anion gap: 8 (ref 5–15)
BUN: 12 mg/dL (ref 6–20)
CO2: 26 mmol/L (ref 22–32)
Calcium: 9.2 mg/dL (ref 8.9–10.3)
Chloride: 106 mmol/L (ref 98–111)
Creatinine, Ser: 0.62 mg/dL (ref 0.44–1.00)
GFR calc Af Amer: >60 mL/min (ref >60)
GFR calc non Af Amer: >60 mL/min (ref >60)
Glucose, Bld: 93 mg/dL (ref 70–99)
Potassium: 3.7 mmol/L (ref 3.5–5.1)
Sodium: 140 mmol/L (ref 135–145)
Total Bilirubin: 0.9 mg/dL (ref 0.3–1.2)
Total Protein: 6.5 g/dL (ref 6.5–8.1)

## 2018-12-27 LAB — URINALYSIS, COMPLETE (UACMP) WITH MICROSCOPIC
Bacteria, UA: NONE SEEN
Bilirubin Urine: NEGATIVE
Glucose, UA: NEGATIVE mg/dL
Hgb urine dipstick: NEGATIVE
Ketones, ur: 20 mg/dL — AB
Leukocytes,Ua: NEGATIVE
Nitrite: NEGATIVE
Protein, ur: 30 mg/dL — AB
Specific Gravity, Urine: 1.028 (ref 1.005–1.030)
pH: 5 (ref 5.0–8.0)

## 2018-12-27 LAB — LIPASE, BLOOD: Lipase: 87 U/L — ABNORMAL HIGH (ref 11–51)

## 2018-12-27 LAB — CBC
HCT: 39.3 % (ref 36.0–46.0)
Hemoglobin: 13.4 g/dL (ref 12.0–15.0)
MCH: 30.4 pg (ref 26.0–34.0)
MCHC: 34.1 g/dL (ref 30.0–36.0)
MCV: 89.1 fL (ref 80.0–100.0)
Platelets: 225 10*3/uL (ref 150–400)
RBC: 4.41 MIL/uL (ref 3.87–5.11)
RDW: 12.7 % (ref 11.5–15.5)
WBC: 6.7 10*3/uL (ref 4.0–10.5)
nRBC: 0 % (ref 0.0–0.2)

## 2018-12-27 LAB — FIBRIN DERIVATIVES D-DIMER (ARMC ONLY): Fibrin derivatives D-dimer (ARMC): <45 ng/mL (FEU) (ref 0.00–499.00)

## 2018-12-27 MED ORDER — LIDOCAINE VISCOUS HCL 2 % MT SOLN: 15.0000 mL | OROMUCOSAL | 0 refills | Status: DC | PRN

## 2018-12-27 MED ORDER — LACTATED RINGERS IV BOLUS
1000.0000 mL | Freq: Once | INTRAVENOUS | Status: AC
  Administered 2018-12-27: 1000 mL via INTRAVENOUS

## 2018-12-27 MED ORDER — ALUM & MAG HYDROXIDE-SIMETH 200-200-20 MG/5ML PO SUSP
30.0000 mL | Freq: Once | ORAL | Status: AC
  Administered 2018-12-27: 30 mL via ORAL
  Filled 2018-12-27: qty 30

## 2018-12-27 MED ORDER — ONDANSETRON 4 MG PO TBDP: 4.0000 mg | ORAL_TABLET | Freq: Three times a day (TID) | ORAL | 0 refills | Status: DC | PRN

## 2018-12-27 MED ORDER — LIDOCAINE VISCOUS HCL 2 % MT SOLN
15.0000 mL | Freq: Once | OROMUCOSAL | Status: AC
  Administered 2018-12-27: 11:00:00 15 mL via ORAL
  Filled 2018-12-27: qty 15

## 2018-12-27 MED ORDER — ONDANSETRON HCL 4 MG/2ML IJ SOLN
4.0000 mg | Freq: Once | INTRAMUSCULAR | Status: AC
  Administered 2018-12-27: 4 mg via INTRAVENOUS
  Filled 2018-12-27: qty 2

## 2018-12-27 NOTE — ED Notes (Signed)
E-signature pad unavailable at time of d/c, pt voices understanding of d/c teaching

## 2018-12-27 NOTE — ED Triage Notes (Signed)
Pt in via POV; reports RUQ abdominal pain, ongoing since December, some intermittent N/V.  Vitals WDL, NAD noted at this time.

## 2018-12-27 NOTE — ED Provider Notes (Signed)
[facility] Emergency Department Provider Note   ____________________________________________   First MD Initiated Contact with Patient 12/27/18 1007     (approximate)  I have reviewed the triage vital signs and the nursing notes.   HISTORY  Chief Complaint Abdominal Pain    HPI Melissa Mcmahon is a 28 y.o. female with past medical history of endometriosis status post hysterectomy presents to the ED complaining of abdominal pain.  Patient reports she has been dealing with near constant right upper quadrant abdominal pain for the past month.  She states it is usually manageable but when she coughs, takes a deep breath, or vomits it can become very severe.  She denies any significant coughing recently, but states that she vomited twice earlier today while at work and was told to get evaluated.  Additionally, she complains of burning pain radiating up into her chest.  She is status post cholecystectomy and has been evaluated in the surgery clinic for similar complaints with unremarkable work-up.  She denies any fevers, chills, dysuria, or hematuria.        Past Medical History:  Diagnosis Date  . Acid reflux   . Asthma    H/O AS A CHILD-NO INHALERS   . Endometriosis   . Headache    MIGRAINES-RARE  . History of hiatal hernia     Patient Active Problem List   Diagnosis Date Noted  . Endometriosis 02/17/2018  . S/P vaginal hysterectomy 02/02/2018  . Symptomatic cholelithiasis 10/02/2017    Past Surgical History:  Procedure Laterality Date  . CHOLECYSTECTOMY N/A 10/07/2017   Procedure: LAPAROSCOPIC CHOLECYSTECTOMY;  Surgeon: Vickie Epley, MD;  Location: [facility] ORS;  Service: General;  Laterality: N/A;  . CYSTOSCOPY W/ RETROGRADES Left 02/04/2018   Procedure: CYSTOSCOPY WITH RETROGRADE PYELOGRAM;  Surgeon: Abbie Sons, MD;  Location: [facility] ORS;  Service: Urology;  Laterality: Left;  . CYSTOSCOPY WITH STENT PLACEMENT Left 02/04/2018   Procedure:  CYSTOSCOPY WITH STENT PLACEMENT;  Surgeon: Abbie Sons, MD;  Location: [facility] ORS;  Service: Urology;  Laterality: Left;  . FEET SURGERY    . KNEE ARTHROSCOPY    . LAPAROSCOPY N/A 02/08/2015   Procedure: LAPAROSCOPY DIAGNOSTIC;  Surgeon: Malachy Mood, MD;  Location: [facility] ORS;  Service: Gynecology;  Laterality: N/A;  . TUBAL LIGATION    . VAGINAL HYSTERECTOMY Bilateral 02/01/2018   Procedure: HYSTERECTOMY VAGINAL WITH BILATERAL SALPINGECTOMY;  Surgeon: Rubie Maid, MD;  Location: [facility] ORS;  Service: Gynecology;  Laterality: Bilateral;    Prior to Admission medications   Medication Sig Start Date End Date Taking? Authorizing Provider  Omeprazole 20 MG TBEC Take 20 mg by mouth 2 (two) times daily.    Yes [provider]  lidocaine (XYLOCAINE) 2 % solution Use as directed 15 mLs in the mouth or throat as needed for mouth pain. 12/27/18   Blake Divine, MD  ondansetron (ZOFRAN ODT) 4 MG disintegrating tablet Take 1 tablet (4 mg total) by mouth every 8 (eight) hours as needed for nausea or vomiting. 12/27/18   Blake Divine, MD    Allergies Penicillins  Family History  Problem Relation Age of Onset  . Diabetes Father   . Diabetes Maternal Aunt   . Healthy Mother   . Breast cancer Neg Hx   . Ovarian cancer Neg Hx   . Colon cancer Neg Hx     Social History Social History   Tobacco Use  . Smoking status: Former Smoker    Packs/day: 1.00  Years: 7.00    Pack years: 7.00    Types: E-cigarettes, Cigarettes    Quit date: 06/07/2017    Years since quitting: 1.5  . Smokeless tobacco: Current User  Substance Use Topics  . Alcohol use: No  . Drug use: No    Review of Systems  Constitutional: No fever/chills Eyes: No visual changes. ENT: No sore throat. Cardiovascular: Positive for chest pain. Respiratory: Denies shortness of breath. Gastrointestinal: Positive for abdominal pain, nausea, and vomiting.  No diarrhea.  No constipation. Genitourinary: Negative for  dysuria. Musculoskeletal: Negative for back pain. Skin: Negative for rash. Neurological: Negative for headaches, focal weakness or numbness.  ____________________________________________   PHYSICAL EXAM:  VITAL SIGNS: ED Triage Vitals  Enc Vitals Group     BP 12/27/18 1001 (!) 148/92     Pulse Rate 12/27/18 1001 89     Resp 12/27/18 1001 17     Temp 12/27/18 1001 98.3 F (36.8 C)     Temp Source 12/27/18 1001 Oral     SpO2 12/27/18 1001 96 %     Weight 12/27/18 1002 130 lb (59 kg)     Height 12/27/18 1002 5\' 3"  (1.6 m)     Head Circumference --      Peak Flow --      Pain Score 12/27/18 1002 8     Pain Loc --      Pain Edu? --      Excl. in GC? --     Constitutional: Alert and oriented. Eyes: Conjunctivae are normal. Head: Atraumatic. Nose: No congestion/rhinnorhea. Mouth/Throat: Mucous membranes are moist. Neck: Normal ROM Cardiovascular: Normal rate, regular rhythm. Grossly normal heart sounds. Respiratory: Normal respiratory effort.  No retractions. Lungs CTAB. Gastrointestinal: Soft and tender in RUQ. No distention. Genitourinary: deferred Musculoskeletal: No lower extremity tenderness nor edema. Neurologic:  Normal speech and language. No gross focal neurologic deficits are appreciated. Skin:  Skin is warm, dry and intact. No rash noted. Psychiatric: Mood and affect are normal. Speech and behavior are normal.  ____________________________________________   LABS (all labs ordered are listed, but only abnormal results are displayed)  Labs Reviewed  LIPASE, BLOOD - Abnormal; Notable for the following components:      Result Value   Lipase 87 (*)    All other components within normal limits  URINALYSIS, COMPLETE (UACMP) WITH MICROSCOPIC - Abnormal; Notable for the following components:   Color, Urine YELLOW (*)    APPearance HAZY (*)    Ketones, ur 20 (*)    Protein, ur 30 (*)    All other components within normal limits  COMPREHENSIVE METABOLIC PANEL   CBC  FIBRIN DERIVATIVES D-DIMER ([facility] ONLY)   ____________________________________________  EKG  ED ECG REPORT I, Chesley Noonharles Noni Stonesifer, the attending physician, personally viewed and interpreted this ECG.   Date: 12/27/2018  EKG Time: 11:24  Rate: 61  Rhythm: normal sinus rhythm  Axis: Normal  Intervals:none  ST&T Change: None    PROCEDURES  Procedure(s) performed (including Critical Care):  Procedures   ____________________________________________   INITIAL IMPRESSION / ASSESSMENT AND PLAN / ED COURSE       28 year old female presenting with 1 month of right upper quadrant abdominal pain, now worsening after multiple episodes of vomiting today.  She has had in-depth assessment of this in the past, with no etiology found.  Do not think repeat abdominal CT indicated today given LFTs and lipase within normal limits, no reason to suspect retained gallstones.  Assess for PE given pleuritic  component of pain, however patient is low risk by [place] with negative d-dimer.  Chest x-ray negative for acute process.  EKG without acute ischemic changes.  Suspect hiatal hernia is contributing to patient's symptoms and she feels better after GI cocktail, will prescribe viscous lidocaine for home use.  Additionally, there is potential for spread of endometriosis as cause of patient's symptoms.  She has follow-up scheduled tomorrow with her OB/GYN and will discuss this then.  Otherwise, no apparent emergent cause of patient's symptoms.  Counseled patient to additionally follow-up with GI and return to the ED for new or worsening symptoms.  Patient agrees with plan.      ____________________________________________   FINAL CLINICAL IMPRESSION(S) / ED DIAGNOSES  Final diagnoses:  Right upper quadrant abdominal pain  Chronic abdominal pain  Hiatal hernia  History of endometriosis     ED Discharge Orders         Ordered    ondansetron (ZOFRAN ODT) 4 MG disintegrating tablet  Every 8 hours  PRN     12/27/18 1227    lidocaine (XYLOCAINE) 2 % solution  As needed     12/27/18 1227           Note:  This document was prepared using Dragon voice recognition software and may include unintentional dictation errors.   Chesley NoonJessup, Kahla Risdon, MD 12/27/18 1425

## 2018-12-27 NOTE — ED Notes (Signed)
Pt sitting in bed speaking with this RN in NAD, A&Ox4.  

## 2018-12-27 NOTE — ED Triage Notes (Signed)
Right upper abd pain since December.  Says she vomited x 1 today.  Still nauseated.

## 2018-12-28 ENCOUNTER — Ambulatory Visit (INDEPENDENT_AMBULATORY_CARE_PROVIDER_SITE_OTHER): Payer: Self-pay | Admitting: Obstetrics and Gynecology

## 2018-12-28 ENCOUNTER — Encounter: Payer: Self-pay | Admitting: Obstetrics and Gynecology

## 2018-12-28 VITALS — BP 154/102 | HR 66 | Ht 63.0 in | Wt 132.8 lb

## 2018-12-28 DIAGNOSIS — R748 Abnormal levels of other serum enzymes: Secondary | ICD-10-CM

## 2018-12-28 DIAGNOSIS — K219 Gastro-esophageal reflux disease without esophagitis: Secondary | ICD-10-CM

## 2018-12-28 DIAGNOSIS — Z8742 Personal history of other diseases of the female genital tract: Secondary | ICD-10-CM

## 2018-12-28 DIAGNOSIS — R030 Elevated blood-pressure reading, without diagnosis of hypertension: Secondary | ICD-10-CM

## 2018-12-28 DIAGNOSIS — R1010 Upper abdominal pain, unspecified: Secondary | ICD-10-CM

## 2018-12-28 MED ORDER — MELOXICAM 15 MG PO TABS: 15.0000 mg | ORAL_TABLET | Freq: Two times a day (BID) | ORAL | 3 refills | Status: DC | PRN

## 2018-12-28 MED ORDER — CIPROFLOXACIN HCL 500 MG PO TABS: 500.0000 mg | ORAL_TABLET | Freq: Two times a day (BID) | ORAL | 0 refills | Status: DC

## 2018-12-28 MED ORDER — GABAPENTIN 100 MG PO CAPS: 200.0000 mg | ORAL_CAPSULE | Freq: Three times a day (TID) | ORAL | 2 refills | Status: DC

## 2018-12-28 NOTE — Patient Instructions (Signed)
Abdominal Pain, Adult Abdominal pain can be caused by many things. Often, abdominal pain is not serious and it gets better with no treatment or by being treated at home. However, sometimes abdominal pain is serious. Your health care provider will do a medical history and a physical exam to try to determine the cause of your abdominal pain. Follow these instructions at home:  Take over-the-counter and prescription medicines only as told by your health care provider. Do not take a laxative unless told by your health care provider.  Drink enough fluid to keep your urine clear or pale yellow.  Watch your condition for any changes.  Keep all follow-up visits as told by your health care provider. This is important. Contact a health care provider if:  Your abdominal pain changes or gets worse.  You are not hungry or you lose weight without trying.  You are constipated or have diarrhea for more than 2-3 days.  You have pain when you urinate or have a bowel movement.  Your abdominal pain wakes you up at night.  Your pain gets worse with meals, after eating, or with certain foods.  You are throwing up and cannot keep anything down.  You have a fever. Get help right away if:  Your pain does not go away as soon as your health care provider told you to expect.  You cannot stop throwing up.  Your pain is only in areas of the abdomen, such as the right side or the left lower portion of the abdomen.  You have bloody or black stools, or stools that look like tar.  You have severe pain, cramping, or bloating in your abdomen.  You have signs of dehydration, such as: ? Dark urine, very little urine, or no urine. ? Cracked lips. ? Dry mouth. ? Sunken eyes. ? Sleepiness. ? Weakness. This information is not intended to replace advice given to you by your health care provider. Make sure you discuss any questions you have with your health care provider. Document Released: 01/29/2005 Document  Revised: 11/09/2015 Document Reviewed: 10/03/2015 Elsevier Interactive Patient Education  2020 [employer] Inc[employer]  

## 2018-12-28 NOTE — Progress Notes (Signed)
Pt is present due to abd pain. Pt stating being nauseated, vomiting and abd pain.

## 2018-12-28 NOTE — Progress Notes (Signed)
GYNECOLOGY PROGRESS NOTE  Subjective:    Patient ID: Melissa Mcmahon, female    DOB: 1990/11/17, 28 y.o.   MRN: [id number]  HPI  Patient is a 28 y.o. [id number] female who presents for complaints of upper abdominal pain worsening over the past 1 to 2 months. Reports that the pain initially began in December, but was mild and intermittent.  Patient notes that the pain has been almost constant, and is mostly near the upper right quadrant.  Patient notes that it is usually worse when she coughs, takes a deep breath, or vomits.  She has a history of acid reflux, however notes that this pain is different.  She currently takes medication for the reflux.  She has been attempting to use ibuprofen for the pain however this does not seem to help.  Patient notes that her PCP prescribed gabapentin, however patient notes that she has to take a higher dose than prescribed in order to get any relief from the medication.  She has been seen in the past by GI and general surgery, with unremarkable work-up including CT scan, and recent CXR performed in the ER yesterday.  Of note, patient has history of cholecystectomy in May 2019, as well as a hysterectomy for endometriosis performed in September 2019.  Patient currently denies any symptoms in the pelvis.  Notes that after her work-up with GI and general surgery they reported that she should follow-up with a gynecologist..  The following portions of the patient's history were reviewed and updated as appropriate: allergies, current medications, past family history, past medical history, past social history, past surgical history and problem list.  Review of Systems Pertinent items noted in HPI and remainder of comprehensive ROS otherwise negative.   Objective:   Blood pressure (!) 154/102, pulse 66, height 5\' 3"  (1.6 m), weight 132 lb 12.8 oz (60.2 kg), last menstrual period 01/11/2018. General appearance: alert and no distress  Chest wall: no deformities, mild  tenderness in RUQ near lower ribs Abdomen: soft, mildly tender in RUQ to palpation. Bowel sounds normal.  Pelvic: deferred.    Labs:  Results for orders placed or performed during the hospital encounter of 12/27/18  Lipase, blood  Result Value Ref Range   Lipase 87 (H) 11 - 51 U/L  Comprehensive metabolic panel  Result Value Ref Range   Sodium 140 135 - 145 mmol/L   Potassium 3.7 3.5 - 5.1 mmol/L   Chloride 106 98 - 111 mmol/L   CO2 26 22 - 32 mmol/L   Glucose, Bld 93 70 - 99 mg/dL   BUN 12 6 - 20 mg/dL   Creatinine, Ser 0.62 0.44 - 1.00 mg/dL   Calcium 9.2 8.9 - 10.3 mg/dL   Total Protein 6.5 6.5 - 8.1 g/dL   Albumin 4.0 3.5 - 5.0 g/dL   AST 18 15 - 41 U/L   ALT 13 0 - 44 U/L   Alkaline Phosphatase 51 38 - 126 U/L   Total Bilirubin 0.9 0.3 - 1.2 mg/dL   GFR calc non Af Amer >60 >60 mL/min   GFR calc Af Amer >60 >60 mL/min   Anion gap 8 5 - 15  CBC  Result Value Ref Range   WBC 6.7 4.0 - 10.5 K/uL   RBC 4.41 3.87 - 5.11 MIL/uL   Hemoglobin 13.4 12.0 - 15.0 g/dL   HCT 39.3 36.0 - 46.0 %   MCV 89.1 80.0 - 100.0 fL   MCH 30.4 26.0 - 34.0 pg  MCHC 34.1 30.0 - 36.0 g/dL   RDW 13.212.7 44.011.5 - 10.215.5 %   Platelets 225 150 - 400 K/uL   nRBC 0.0 0.0 - 0.2 %  Urinalysis, Complete w Microscopic  Result Value Ref Range   Color, Urine YELLOW (A) YELLOW   APPearance HAZY (A) CLEAR   Specific Gravity, Urine 1.028 1.005 - 1.030   pH 5.0 5.0 - 8.0   Glucose, UA NEGATIVE NEGATIVE mg/dL   Hgb urine dipstick NEGATIVE NEGATIVE   Bilirubin Urine NEGATIVE NEGATIVE   Ketones, ur 20 (A) NEGATIVE mg/dL   Protein, ur 30 (A) NEGATIVE mg/dL   Nitrite NEGATIVE NEGATIVE   Leukocytes,Ua NEGATIVE NEGATIVE   RBC / HPF 0-5 0 - 5 RBC/hpf   WBC, UA 0-5 0 - 5 WBC/hpf   Bacteria, UA NONE SEEN NONE SEEN   Squamous Epithelial / LPF 6-10 0 - 5   Mucus PRESENT   Fibrin derivatives D-Dimer  Result Value Ref Range   Fibrin derivatives D-dimer ([facility]) <45.00 0.00 - 499.00 ng/mL (FEU)    Imaging:  DG  Chest Portable 1 View CLINICAL DATA:  Right upper abdominal pain.  EXAM: PORTABLE CHEST 1 VIEW  COMPARISON:  07/22/2017.  FINDINGS: Mediastinum hilar structures normal. Mild bibasilar subsegmental atelectasis. No pleural effusion or pneumothorax. Heart size normal. No acute bony abnormality.  IMPRESSION: Mild bibasilar subsegmental atelectasis, otherwise negative exam.  Electronically Signed   By: Maisie Fushomas  Register   On: 12/27/2018 11:11    CLINICAL DATA:  Elevated lipase. Abdominal pain. Previous cholecystectomy.  EXAM: CT ABDOMEN AND PELVIS WITH CONTRAST  TECHNIQUE: Multidetector CT imaging of the abdomen and pelvis was performed using the standard protocol following bolus administration of intravenous contrast.  CONTRAST:  100mL OMNIPAQUE IOHEXOL 300 MG/ML  SOLN  COMPARISON:  02/07/2018  FINDINGS: Lower chest: Normal  Hepatobiliary: Previous cholecystectomy. Liver parenchyma appears normal. No ductal dilatation. No evidence of ductal system stone.  Pancreas: Normal.  No CT evidence of pancreatitis.  Spleen: Normal  Adrenals/Urinary Tract: Adrenal glands are normal. Kidneys are normal. Bladder is normal.  Stomach/Bowel: No abnormal bowel finding. No obstruction. No inflammatory changes. Normal appearing appendix.  Vascular/Lymphatic: Normal  Reproductive: Previous hysterectomy.  No pelvic mass.  Other: No free fluid or air.  Musculoskeletal: Normal  IMPRESSION: No acute finding. Previous cholecystectomy. Previous hysterectomy. No imaging evidence of pancreatitis.   Electronically Signed   By: Paulina FusiMark  Shogry M.D.   On: 09/10/2018 13:09   Assessment:   Upper abdominal pain History of acid reflux Elevated lipase History of endometriosis  Plan:   1.  Upper abdominal pain -unclear cause of pain at this time.  Patient has had a negative initial work-up by GI as well as by general surgery, with unremarkable findings to date.   On my exam patient with tenderness in the right upper quadrant.  She is status post a cholecystectomy.  All recent CT scan does not note the presence of gallstone pancreatitis, however pain is resembling that etiology.  Lipase also mildly elevated, has been elevated over the past 6 months despite history of cholecystectomy.  Also cannot rule out the possibility of scar tissue due to previous cholecystectomy as a cause of pain, as well as costochondritis.  Lastly, patient does have a history of  Endometriosis, there is the remote possibility of implant seeding in the upper abdomen (however patient's pain does not appear to be cyclical in nature).  We will attempt to treat with antibiotics for 7 days, will prescribe ciprofloxacin.  Also will give stronger NSAID (Mobic) who the patient can decrease the use of number of tablets of NSAIDs that she is using with over-the-counter medications.  We will also give new prescription for patient's gabapentin as she notes that it does help in higher doses.  Was previously prescribed 100 mg TID, will change to 200 mg TID.  If no further improvement in symptoms, would recommend outpatient follow-up again with GI.  She reports that she has an appointment with Kendell Bane within the next month, however does not desire to keep appointment if nothing different is going to be done as her insurance may not cover her visit. 2.  History of gastric reflux -patient advised to continue PPI as prescribed. 3.  History of endometriosis -patient with no readily visible lesions at time of hysterectomy.  Is low on differential regarding possible seeding of implants into the upper abdomen.  Had a recent chest x-ray to rule out possibility of lung lesions.  4. Return to clinic for any scheduled appointments or for any gynecologic concerns as needed. Return sooner if symptoms do not improve.    A total of 15 minutes were spent face-to-face with the patient during this encounter and over half of  that time dealt with counseling and coordination of care.   Hildred Laser, MD [facility]

## 2018-12-29 ENCOUNTER — Other Ambulatory Visit: Payer: Self-pay

## 2018-12-29 ENCOUNTER — Other Ambulatory Visit: Payer: Self-pay | Admitting: Gastroenterology

## 2018-12-29 ENCOUNTER — Ambulatory Visit
Admission: RE | Admit: 2018-12-29 | Discharge: 2018-12-29 | Disposition: A | Discharge status: Home / Self Care | Payer: Self-pay | Source: Ambulatory Visit | Attending: Gastroenterology | Admitting: Gastroenterology

## 2018-12-29 ENCOUNTER — Telehealth: Payer: Self-pay | Admitting: Obstetrics and Gynecology

## 2018-12-29 DIAGNOSIS — R1013 Epigastric pain: Secondary | ICD-10-CM

## 2018-12-29 DIAGNOSIS — R748 Abnormal levels of other serum enzymes: Secondary | ICD-10-CM | POA: Insufficient documentation

## 2018-12-29 DIAGNOSIS — R1011 Right upper quadrant pain: Secondary | ICD-10-CM

## 2018-12-29 NOTE — Telephone Encounter (Signed)
The patient called and stated that her medication was sent to the wrong pharmacy. The pt stated that her pharmacy is [facility]. The pharmacy's number is [phone]. Please advise.

## 2018-12-29 NOTE — Telephone Encounter (Signed)
Pt called back and stated to leave the medication at the pharmacy it was sent to. It is closer to her home.

## 2018-12-29 NOTE — Telephone Encounter (Signed)
Pt stated called and stated that she is okay with going to the other pharmacy to get her prescription, Pt stated that she no longer needs medications sent to [facility] center. Please advise.

## 2019-01-03 NOTE — Telephone Encounter (Signed)
error 

## 2019-01-18 ENCOUNTER — Encounter: Payer: Self-pay | Admitting: General Surgery

## 2019-01-18 ENCOUNTER — Other Ambulatory Visit: Payer: Self-pay

## 2019-01-18 ENCOUNTER — Ambulatory Visit: Payer: Self-pay | Admitting: Surgery

## 2019-01-18 VITALS — BP 138/88 | HR 66 | Temp 97.9°F | Ht 63.0 in | Wt 133.0 lb

## 2019-01-18 DIAGNOSIS — R1011 Right upper quadrant pain: Secondary | ICD-10-CM

## 2019-01-18 NOTE — Progress Notes (Signed)
01/18/2019  History of Present Illness: Melissa Mcmahon is a 28 y.o. female presenting for follow up of right upper quadrant pain.  She's s/p laparoscopic cholecystectomy with Dr. Earlene Plater in 10/2017, which per his Op Note was uneventful without any complications.  Full gallbladder was removed with only minimal pericholecystic inflammation.  She also is s/p vaginal hysterectomy with salpingectomy with Dr. Valentino Saxon in 01/2018 for pelvic pain.  Pathology noted endometriosis in the fallopian tube.  She reports that since about December 2019 she started having some mild RUQ discomfort which has now progressed to more persistent RUQ pain, and flareups of worsening sharp pain.  The pain was initially only when she coughed or sneezed.  Now it's constant radiating to the back.  She also has a history of gastritis and had an EGD on 9/9 with Dr. Marva Panda which showed gastritis.  Her PPI was changed to see if that would help better.  She reports now she also has nausea and vomiting related to the gastritis and when that happens, her pain is severely worse.   She has seen Dr. Valentino Saxon in follow up as well.  She has tried Gabapentin but does not like how it makes her feel and stopped it.  She also tries Meloxicam but that has not helped.  She has had multiple imaging studies which have failed to show any pathology in the RUQ.  Her CBD is normal in size, there is no residual stone, there is no abscess, no residual gallbladder, no fluid collection.   She's been to the ED multiple times as well.  Past Medical History: Past Medical History:  Diagnosis Date  . Acid reflux   . Asthma    H/O AS A CHILD-NO INHALERS   . Endometriosis   . Headache    MIGRAINES-RARE  . History of hiatal hernia      Past Surgical History: Past Surgical History:  Procedure Laterality Date  . CHOLECYSTECTOMY N/A 10/07/2017   Procedure: LAPAROSCOPIC CHOLECYSTECTOMY;  Surgeon: Ancil Linsey, MD;  Location: [facility] OR[facility];  Service: General;   Laterality: N/A;  . CYSTOSCOPY W/ RETROGRADES Left 02/04/2018   Procedure: CYSTOSCOPY WITH RETROGRADE PYELOGRAM;  Surgeon: Riki Altes, MD;  Location: [facility];  Service: Urology;  Laterality: Left;  . CYSTOSCOPY WITH STENT PLACEMENT Left 02/04/2018   Procedure: CYSTOSCOPY WITH STENT PLACEMENT;  Surgeon: Riki Altes, MD;  Location: [facility];  Service: Urology;  Laterality: Left;  . FEET SURGERY    . KNEE ARTHROSCOPY    . LAPAROSCOPY N/A 02/08/2015   Procedure: LAPAROSCOPY DIAGNOSTIC;  Surgeon: Vena Austria, MD;  Location: [facility];  Service: Gynecology;  Laterality: N/A;  . TUBAL LIGATION    . VAGINAL HYSTERECTOMY Bilateral 02/01/2018   Procedure: HYSTERECTOMY VAGINAL WITH BILATERAL SALPINGECTOMY;  Surgeon: Hildred Laser, MD;  Location: [facility];  Service: Gynecology;  Laterality: Bilateral;    Home Medications: Prior to Admission medications   Medication Sig Start Date End Date Taking? Authorizing Provider  gabapentin (NEURONTIN) 100 MG capsule Take 2 capsules (200 mg total) by mouth 3 (three) times daily. 12/28/18  Yes Hildred Laser, MD  lidocaine (XYLOCAINE) 2 % solution Use as directed 15 mLs in the mouth or throat as needed for mouth pain. 12/27/18  Yes Chesley Noon, MD  meloxicam (MOBIC) 15 MG tablet Take 1 tablet (15 mg total) by mouth 2 (two) times daily as needed for pain. 12/28/18  Yes Hildred Laser, MD  Omeprazole 20 MG TBEC Take 20 mg by mouth 2 (  two) times daily.    Yes [provider]  ondansetron (ZOFRAN ODT) 4 MG disintegrating tablet Take 1 tablet (4 mg total) by mouth every 8 (eight) hours as needed for nausea or vomiting. 12/27/18  Yes Blake Divine, MD    Allergies: Allergies  Allergen Reactions  . Penicillins Hives    Has patient had a PCN reaction causing immediate rash, facial/tongue/throat swelling, SOB or lightheadedness with hypotension: unkn Has patient had a PCN reaction causing severe rash involving mucus membranes or skin necrosis:  no Has patient had a PCN reaction that required hospitalization: no Has patient had a PCN reaction occurring within the last 10 years: no If all of the above answers are "NO", then may proceed with Cephalosporin use.     Review of Systems: Review of Systems  Constitutional: Negative for chills and fever.  Respiratory: Negative for shortness of breath.   Cardiovascular: Negative for chest pain.  Gastrointestinal: Positive for abdominal pain, nausea and vomiting.  Skin: Negative for rash.    Physical Exam BP 138/88   Pulse 66   Temp 97.9 F (36.6 C) (Skin)   Ht 5\' 3"  (1.6 m)   Wt 133 lb (60.3 kg)   LMP 01/11/2018 (Exact Date) Comment: tubal ligation  BMI 23.56 kg/m  CONSTITUTIONAL: No acute distress HEENT:  Normocephalic, atraumatic, extraocular motion intact. RESPIRATORY:  Lungs are clear, and breath sounds are equal bilaterally. Normal respiratory effort without pathologic use of accessory muscles. CARDIOVASCULAR: Heart is regular without murmurs, gallops, or rubs. GI: The abdomen is soft, non-distended, with tenderness to palpation in the right upper quadrant.  All incisions are well healed.  There is no evidence of any hernia at the incisions.  There is no erythema or drainage.  NEUROLOGIC:  Motor and sensation is grossly normal.  Cranial nerves are grossly intact. PSYCH:  Alert and oriented to person, place and time. Affect is normal.  Labs/Imaging: U/S 12/29/18: IMPRESSION: Status post cholecystectomy. No other abnormality seen in the Abdomen.  CT scan 09/10/18: IMPRESSION: No acute finding. Previous cholecystectomy. Previous hysterectomy. No imaging evidence of pancreatitis.  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  No abnormal fluid collection or postoperative complications noted status post hysterectomy and tubal  ligation.  Labs 12/27/18: Na 140, K 3.7, Cl 106, CO2 26, BUN 12, Cr 0.62, LFTs within normal, lipasae 87.  WB 6.7, Hgb 13.4, Hct 39.3, Plt 225.    U/A negative for LE or nitrites.  Assessment and Plan: This is a 28 y.o. female with chronic RUQ pain.  Discussed with the patient that at this point her workup has been negative and there is no clear target to go after with surgical intervention.  There is no evidence of any complication from her cholecystectomy.  Discussed with her that at this point there is no clear surgical indication and need.  Surgery for pain without a clear etiology does not typically yield any results and only brings more pain and scar tissue.  Discussed that possibly her gastritis could contribute to pain, possibly any missed endometriosis implants, but I do not know the likelihood of either one.  Unfortunately, we're unable to help her for her pain at this point.  Discussed that we could refer her to the Pain Clinic or potentially to a different surgical group for a second opinion.  She says she will talk to Dr. Marcelline Mates  again.  She is aware that she can call us anytime if she needs a referral to another surgery group or to pain clinic.  Face-to-face time spent with the patient and care providers was 25 minutes, with more than 50% of the time spent counseling, educating, and coordinating care of the patient.     Howie IllJose Luis Arie Gable, MD [facility]

## 2019-01-18 NOTE — Patient Instructions (Signed)
Please call if you have questions or concerns.  

## 2019-01-19 ENCOUNTER — Encounter: Payer: Self-pay | Admitting: Obstetrics and Gynecology

## 2019-01-19 ENCOUNTER — Ambulatory Visit (INDEPENDENT_AMBULATORY_CARE_PROVIDER_SITE_OTHER): Payer: Medicaid Other | Admitting: Obstetrics and Gynecology

## 2019-01-19 ENCOUNTER — Other Ambulatory Visit: Payer: Self-pay

## 2019-01-19 VITALS — BP 125/87 | HR 64 | Ht 63.0 in | Wt 134.2 lb

## 2019-01-19 DIAGNOSIS — Z8742 Personal history of other diseases of the female genital tract: Secondary | ICD-10-CM

## 2019-01-19 DIAGNOSIS — R1011 Right upper quadrant pain: Secondary | ICD-10-CM

## 2019-01-19 DIAGNOSIS — R112 Nausea with vomiting, unspecified: Secondary | ICD-10-CM

## 2019-01-19 DIAGNOSIS — K2950 Unspecified chronic gastritis without bleeding: Secondary | ICD-10-CM

## 2019-01-19 DIAGNOSIS — G8929 Other chronic pain: Secondary | ICD-10-CM

## 2019-01-19 MED ORDER — NORETHINDRONE ACETATE 5 MG PO TABS: 5.0000 mg | ORAL_TABLET | Freq: Every day | ORAL | 2 refills | Status: DC

## 2019-01-19 NOTE — Progress Notes (Signed)
Pt stated having right side pain since December 2019. Pt stated taking ibuprofen and tylenol for the pain and neither one of them relieved the pain.

## 2019-01-19 NOTE — Progress Notes (Signed)
GYNECOLOGY PROGRESS NOTE  Subjective:    Patient ID: Melissa Mcmahon, female    DOB: Apr 30, 1991, 30 y.o.   MRN: [id number]  HPI  Patient is a 28 y.o. [id number] female who presents for f/u of persistent upper abdominal pain (RUQ) since December 2019. She is s/p laparoscopic cholecystectomy in June 2019, and s/p vaginal hysterectomy with bilateral salpingectomy in September 2019 (for pelvic pain). She has a history of gastritis.  She has been seen by Gastroenterology, who has changed her PPI to see if it will help (started a new regimen approximately 3 days ago).  She is also had an EGD with no significant findings except for gastritis.  Patient reports the pain initially was intermittent and mild in the right upper quadrant, usually occurring with sharp pains after coughing or sneezing.  However has progressed to be more persistent, with flareups of worsening sharp pain.  She also has nausea and vomiting related to the gastritis when it happens.  Her pain has not been noted to be cyclical in nature.  She has had multiple imaging studies that have all been normal.  At patient's last visit with me last month, I changed her from ibuprofen to meloxicam to see if a stronger NSAID would help, also increased her gabapentin that was given to her by her PCP do so if this would help with her pain as the low-dose initially began helping but the pain soon returned.  Patient notes that she had to discontinue both medications as she did not like the way they made her feel as well as she did not notice any change in her symptoms.  I also attempted to start her on an antibiotic regimen as her lipase was mildly elevated persistently, however patient notes that when she was seen by GI that they noted that they do not believe it was pancreatitis and did not recommend for her to take the antibiotics.  Patient reports that she is becoming frustrated and tired of going from doctor to doctor without any findings.  Notes  that she knows that something is wrong as she has never had this pain before and it continues to get worse.  Patient states that she just wants answers.  The pain is beginning to interfere with her life and work.  The following portions of the patient's history were reviewed and updated as appropriate: allergies, current medications, past family history, past medical history, past social history, past surgical history and problem list.  Review of Systems Pertinent items noted in HPI and remainder of comprehensive ROS otherwise negative.   Objective:   Blood pressure 125/87, pulse 64, height 5\' 3"  (1.6 m), weight 134 lb 3.2 oz (60.9 kg), last menstrual period 01/11/2018. General appearance: alert and no distress Abdomen: normal findings: bowel sounds normal, no masses palpable, no organomegaly and soft and abnormal findings:  mild tenderness in the RUQ Pelvic: deferred Neurologic: Grossly normal  Psychiatric: normal speech and thought content, mildly blunted affect   Assessment:   Persistent right upper quadrant pain History of pelvic pain and endometriosis Gastritis Nausea/vomiting  Plan:   -Lengthy discussion had with patient regarding her right upper quadrant pain.  Unfortunately despite extensive work-up performed by GI, evaluations from general surgery and GYN, there have been no significant findings to date to explain her pain.  Patient currently on a new regimen for treatment of her gastritis, has been on this for approximately 3 days.  Per general surgery it is unlikely that patient has  significant adhesive disease as her cholecystectomy was uncomplicated.  With regards to gynecology, patient did have findings of endometriosis in her fallopian tube.  Has not had any further pelvic pain or issues related to endometriosis since her hysterectomy.  However as it has been hinted to by other specialties of the remote possibility of migrating implants even up towards the lungs and other  organs in the upper abdomen, discussed the option with patient of a trial of hormonal suppression to see if her pain regresses.  Discussed options such as Aygestin (oral progesterone), Freida Busman, or high-dose birth control.  Patient notes that she is willing to try the Aygestin.  Discussed that the medication would likely begin to show effects if any within 2 to 4 weeks.  Would follow-up after a month's trial to see if her symptoms have begun to improve. - I also discussed the option of surgery with patient.  Although was not recommended by general surgery due to the low risk of adhesions, I discussed that if no other findings came about or no other interventions helped, that a possible diagnostic laparoscopy could be considered in order to rule out any possibility of adhesions or other pathology.  However I also asked the patient to consider that it was not a guarantee that this would work, and would have to weigh risk versus benefits of having another surgery with the possibility of no improvement in her symptoms and the development of more scar tissue.  Patient states that she would like to avoid surgery if possible, however if nothing else works then she may consider.  She can also consider the possibility of a referral for second opinion, however patient notes with her insurance she would have to pay out-of-pocket and cannot afford this.  - RTC in 1 month if no improvement.    A total of 25 minutes were spent face-to-face with the patient during this encounter and over half of that time involved counseling and coordination of care.   Rubie Maid, MD [facility]

## 2019-01-19 NOTE — Patient Instructions (Signed)
Abdominal Pain, Adult Abdominal pain can be caused by many things. Often, abdominal pain is not serious and it gets better with no treatment or by being treated at home. However, sometimes abdominal pain is serious. Your health care provider will do a medical history and a physical exam to try to determine the cause of your abdominal pain. Follow these instructions at home:  Take over-the-counter and prescription medicines only as told by your health care provider. Do not take a laxative unless told by your health care provider.  Drink enough fluid to keep your urine clear or pale yellow.  Watch your condition for any changes.  Keep all follow-up visits as told by your health care provider. This is important. Contact a health care provider if:  Your abdominal pain changes or gets worse.  You are not hungry or you lose weight without trying.  You are constipated or have diarrhea for more than 2-3 days.  You have pain when you urinate or have a bowel movement.  Your abdominal pain wakes you up at night.  Your pain gets worse with meals, after eating, or with certain foods.  You are throwing up and cannot keep anything down.  You have a fever. Get help right away if:  Your pain does not go away as soon as your health care provider told you to expect.  You cannot stop throwing up.  Your pain is only in areas of the abdomen, such as the right side or the left lower portion of the abdomen.  You have bloody or black stools, or stools that look like tar.  You have severe pain, cramping, or bloating in your abdomen.  You have signs of dehydration, such as: ? Dark urine, very little urine, or no urine. ? Cracked lips. ? Dry mouth. ? Sunken eyes. ? Sleepiness. ? Weakness. This information is not intended to replace advice given to you by your health care provider. Make sure you discuss any questions you have with your health care provider. Document Released: 01/29/2005 Document  Revised: 11/09/2015 Document Reviewed: 10/03/2015 Elsevier Interactive Patient Education  2020 [employer] Inc[employer]  

## 2019-01-20 ENCOUNTER — Encounter: Payer: Self-pay | Admitting: Obstetrics and Gynecology

## 2019-01-27 ENCOUNTER — Telehealth: Payer: Self-pay | Admitting: Obstetrics and Gynecology

## 2019-01-27 NOTE — Telephone Encounter (Signed)
Pt called no answer no vm picked up.  

## 2019-01-27 NOTE — Telephone Encounter (Signed)
Patient called stating she cant tell if the medication she was given is working(she cant remember the name). She states she is still in a lot of pain and would like to go ahead with other options. Thanks

## 2019-02-02 NOTE — Telephone Encounter (Signed)
Spoke with pt and she stated still having a lot of pain and would like to discuss other options possible surgery due the medication is not working.

## 2019-02-04 ENCOUNTER — Telehealth: Payer: Self-pay | Admitting: Obstetrics and Gynecology

## 2019-02-04 MED ORDER — TRAMADOL HCL 50 MG PO TABS: 50.0000 mg | ORAL_TABLET | Freq: Four times a day (QID) | ORAL | 0 refills | Status: DC | PRN

## 2019-02-04 NOTE — Telephone Encounter (Signed)
The patient called and stated that she needs to speak with her nurse if possible or provider in regards to the patient being in pain and needs a different kind of pain medication. Pt also rescheduled  Appointment to sooner opening so pt can see provider sooner. Please advise.

## 2019-02-04 NOTE — Telephone Encounter (Signed)
I can prescribe Tramadol for her if she needs something stronger for pain (I don't think she has tried this one).  If she has and it did not work, please let me know.   Dr. Marcelline Mates

## 2019-02-04 NOTE — Telephone Encounter (Signed)
Please send to [place]

## 2019-02-04 NOTE — Telephone Encounter (Signed)
Patient has moved appointment to next week. Please advise on medication.

## 2019-02-10 ENCOUNTER — Ambulatory Visit (INDEPENDENT_AMBULATORY_CARE_PROVIDER_SITE_OTHER): Payer: Medicaid Other | Admitting: Obstetrics and Gynecology

## 2019-02-10 ENCOUNTER — Other Ambulatory Visit: Payer: Self-pay

## 2019-02-10 ENCOUNTER — Encounter: Payer: Self-pay | Admitting: Obstetrics and Gynecology

## 2019-02-10 VITALS — BP 133/89 | HR 61 | Ht 63.0 in | Wt 126.3 lb

## 2019-02-10 DIAGNOSIS — Z8742 Personal history of other diseases of the female genital tract: Secondary | ICD-10-CM

## 2019-02-10 DIAGNOSIS — G8929 Other chronic pain: Secondary | ICD-10-CM

## 2019-02-10 DIAGNOSIS — K2950 Unspecified chronic gastritis without bleeding: Secondary | ICD-10-CM

## 2019-02-10 DIAGNOSIS — R1011 Right upper quadrant pain: Secondary | ICD-10-CM

## 2019-02-10 NOTE — Progress Notes (Signed)
GYNECOLOGY PROGRESS NOTE  Subjective:    Patient ID: Melissa Mcmahon, female    DOB: 1990-12-09, 28 y.o.   MRN: [id number]  HPI  Patient is a 28 y.o. [id number] female who presents for follow up of her upper abdominal pain. She has been seen for multiple visits over the past few months for this pain, as well as being seen by GI and General Surgery.  Since her last visit, she reports that the Tramadol that was recently prescribed helps to ease the pain, but has to use regularly (scheduled), however reports side effects of drowsiness so tries not to use it during the day. Stopped taking the norethindrone after 2 days as it made her feel loopy and tearful and started noted anxiety after Day 4 so she stopped. Also stopped the Gabapentin and Mobic as she "didn't feel like herself" while on them and did not feel that they were really helping.  She states that she is tired of trying medications that just don't work for her.  She would like to discuss the option of surgery as she feels that she "knows and feels that there is going on inside and just wants to know what it is".   The following portions of the patient's history were reviewed and updated as appropriate: allergies, current medications, past family history, past medical history, past social history, past surgical history and problem list.  Review of Systems Pertinent items noted in HPI and remainder of comprehensive ROS otherwise negative.   Objective:   Blood pressure 133/89, pulse 61, height 5\' 3"  (1.6 m), weight 126 lb 4.8 oz (57.3 kg), last menstrual period 01/11/2018. General appearance: alert and no distress Exam deferred today (at patient's request).    Assessment:   Chronic RUQ pain Chronic gastritis without bleeding, unspecified gastritis type History of endometriosis  Plan:   - Chronic RUQ pain -still unclear cause of pain at this time.  Pain has been ongoing since December 2019. Patient has had a negative initial work-up  by GI as well as by general surgery, with unremarkable findings to date. She is status post a cholecystectomy.  On recent CT scan does not note the presence of gallstone pancreatitis, however pain is resembling that etiology.  Lipase also mildly elevated, has been elevated over the past 6 months despite history of cholecystectomy.  Also cannot rule out the possibility of scar tissue due to previous cholecystectomy as a cause of pain, as well as costochondritis. Pain has progressively worsened with no relief despite NSAIDs, Tylenol, Gabapentin. Has also had treatments with gastric reflux meds. Per general surgery it is unlikely that patient has significant adhesive disease as her cholecystectomy was uncomplicated.  With regards to gynecology, patient did have findings of endometriosis in her fallopian tube.  Has not had any further pelvic pain or issues related to endometriosis since her hysterectomy.  However as it has been hinted to by other specialties of the remote possibility of migrating implants even up towards the lungs and other organs in the upper abdomen,Multiple conversations have been had regarding patient's complaints and no significant findings. Failed trial of Aygestin due to intolerance, discussed other options of hormonal suppression, including combined OCPs, [place]il[place], Lupron, or Provera which patient declines.  Also offered option of getting a second opinion in [place] with GI/General Surgery for her pain. Patient declines all, notes that she would prefer to proceed with surgery (diagnostic laparoscopy) as she truly feels that there is something going on that's not  right. I also discussed the option of surgery with patient.   - Although was not recommended by general surgery due to the low risk of adhesions, I discussed that if no other findings came about or no other interventions helped, that a possible diagnostic laparoscopy could be considered in order to rule out any possibility of  adhesions or other pathology.  However I also asked the patient to consider that it was not a guarantee that this would work, and would have to weigh risk versus benefits of having another surgery with the possibility of no improvement in her symptoms and the development of more scar tissue. Patient desires to know cost of the surgery, and if feasible would like to consider this option. Notes that if surgery is not affordable, she will then consider going to [facility] for a second opinion as I have informed her that there may be charity care funds to help her due to her issues with insurance.   Will allow patient to talk with surgery scheduler and proceed from there based on information presented.    A total of 15 minutes were spent face-to-face with the patient during this encounter and over half of that time dealt with counseling and coordination of care.    Rubie Maid, MD [facility]

## 2019-02-10 NOTE — Progress Notes (Signed)
Pt present to discuss surgery. Pt stated that she was doing well no problems.  

## 2019-02-10 NOTE — H&P (View-Only) (Signed)
  GYNECOLOGY PROGRESS NOTE  Subjective:    Patient ID: Melissa Mcmahon, female    DOB: 04/02/1991, 28 y.o.   MRN: [id number]  HPI  Patient is a 28 y.o. G2P2002 female who presents for follow up of her upper abdominal pain. She has been seen for multiple visits over the past few months for this pain, as well as being seen by GI and General Surgery.  Since her last visit, she reports that the Tramadol that was recently prescribed helps to ease the pain, but has to use regularly (scheduled), however reports side effects of drowsiness so tries not to use it during the day. Stopped taking the norethindrone after 2 days as it made her feel loopy and tearful and started noted anxiety after Day 4 so she stopped. Also stopped the Gabapentin and Mobic as she "didn't feel like herself" while on them and did not feel that they were really helping.  She states that she is tired of trying medications that just don't work for her.  She would like to discuss the option of surgery as she feels that she "knows and feels that there is going on inside and just wants to know what it is".   The following portions of the patient's history were reviewed and updated as appropriate: allergies, current medications, past family history, past medical history, past social history, past surgical history and problem list.  Review of Systems Pertinent items noted in HPI and remainder of comprehensive ROS otherwise negative.   Objective:   Blood pressure 133/89, pulse 61, height 5' 3" (1.6 m), weight 126 lb 4.8 oz (57.3 kg), last menstrual period 01/11/2018. General appearance: alert and no distress Exam deferred today (at patient's request).    Assessment:   Chronic RUQ pain Chronic gastritis without bleeding, unspecified gastritis type History of endometriosis  Plan:   - Chronic RUQ pain -still unclear cause of pain at this time.  Pain has been ongoing since December 2019. Patient has had a negative initial work-up  by GI as well as by general surgery, with unremarkable findings to date. She is status post a cholecystectomy.  On recent CT scan does not note the presence of gallstone pancreatitis, however pain is resembling that etiology.  Lipase also mildly elevated, has been elevated over the past 6 months despite history of cholecystectomy.  Also cannot rule out the possibility of scar tissue due to previous cholecystectomy as a cause of pain, as well as costochondritis. Pain has progressively worsened with no relief despite NSAIDs, Tylenol, Gabapentin. Has also had treatments with gastric reflux meds. Per general surgery it is unlikely that patient has significant adhesive disease as her cholecystectomy was uncomplicated.  With regards to gynecology, patient did have findings of endometriosis in her fallopian tube.  Has not had any further pelvic pain or issues related to endometriosis since her hysterectomy.  However as it has been hinted to by other specialties of the remote possibility of migrating implants even up towards the lungs and other organs in the upper abdomen,Multiple conversations have been had regarding patient's complaints and no significant findings. Failed trial of Aygestin due to intolerance, discussed other options of hormonal suppression, including combined OCPs, Orilissa, Lupron, or Provera which patient declines.  Also offered option of getting a second opinion in [place] with GI/General Surgery for her pain. Patient declines all, notes that she would prefer to proceed with surgery (diagnostic laparoscopy) as she truly feels that there is something going on that's not   right. I also discussed the option of surgery with patient.   - Although was not recommended by general surgery due to the low risk of adhesions, I discussed that if no other findings came about or no other interventions helped, that a possible diagnostic laparoscopy could be considered in order to rule out any possibility of  adhesions or other pathology.  However I also asked the patient to consider that it was not a guarantee that this would work, and would have to weigh risk versus benefits of having another surgery with the possibility of no improvement in her symptoms and the development of more scar tissue. Patient desires to know cost of the surgery, and if feasible would like to consider this option. Notes that if surgery is not affordable, she will then consider going to [facility] for a second opinion as I have informed her that there may be charity care funds to help her due to her issues with insurance.   Will allow patient to talk with surgery scheduler and proceed from there based on information presented.    A total of 15 minutes were spent face-to-face with the patient during this encounter and over half of that time dealt with counseling and coordination of care.    Rubie Maid, MD [facility]

## 2019-02-10 NOTE — Patient Instructions (Signed)
Diagnostic Laparoscopy Diagnostic laparoscopy is a procedure to diagnose diseases in the abdomen. It might be done for a variety of reasons, such as to look for scar tissue, cancer, or a reason for abdomen (abdominal) pain. During the procedure, a thin, flexible tube that has a light and a camera on the end (laparoscope) is inserted through an incision in the abdomen. The image from the camera is shown on a monitor to help your surgeon see inside your body. Tell a health care provider about:  Any allergies you have.  All medicines you are taking, including vitamins, herbs, eye drops, creams, and over-the-counter medicines.  Any problems you or family members have had with anesthetic medicines.  Any blood disorders you have.  Any surgeries you have had.  Any medical conditions you have. What are the risks? Generally, this is a safe procedure. However, problems may occur, including:  Infection.  Bleeding.  Allergic reactions to medicines or dyes.  Damage to abdominal structures or organs, such as the intestines, liver, stomach, or spleen. What happens before the procedure? Medicines  Ask your health care provider about: ? Changing or stopping your regular medicines. This is especially important if you are taking diabetes medicines or blood thinners. ? Taking medicines such as aspirin and ibuprofen. These medicines can thin your blood. Do not take these medicines unless your health care provider tells you to take them. ? Taking over-the-counter medicines, vitamins, herbs, and supplements.  You may be given antibiotic medicine to help prevent infection. Staying hydrated Follow instructions from your health care provider about hydration, which may include:  Up to 2 hours before the procedure - you may continue to drink clear liquids, such as water, clear fruit juice, black coffee, and plain tea. Eating and drinking restrictions Follow instructions from your health care provider  about eating and drinking, which may include:  8 hours before the procedure - stop eating heavy meals or foods such as meat, fried foods, or fatty foods.  6 hours before the procedure - stop eating light meals or foods, such as toast or cereal.  6 hours before the procedure - stop drinking milk or drinks that contain milk.  2 hours before the procedure - stop drinking clear liquids. General instructions  Ask your health care provider how your surgical site will be marked or identified.  You may be asked to shower with a germ-killing soap.  Plan to have someone take you home from the hospital or clinic.  Plan to have a responsible adult care for you for at least 24 hours after you leave the hospital or clinic. This is important. What happens during the procedure?   To lower your risk of infection: ? Your health care team will wash or sanitize their hands. ? Hair may be removed from the surgical area. ? Your skin will be washed with soap.  An IV will be inserted into one of your veins.  You will be given a medicine to make you fall asleep (general anesthetic). You may also be given a medicine to help you relax (sedative).  A breathing tube will be placed down your throat to help you breathe during the procedure.  Your abdomen will be filled with an air-like gas so it expands. This will give the surgeon more room to operate and will make your organs easier to see.  Many small incisions will be made in your abdomen.  A laparoscope and other surgical instruments will be inserted into your abdomen through the   incisions.  A tissue sample may be removed from an organ for examination (biopsy). This will depend on the reason why you are having this procedure.  The laparoscope and other instruments will be removed from your abdomen.  The gas will be released.  Your incisions will be closed with stitches (sutures) and covered with a bandage (dressing).  Your breathing tube will be  removed. The procedure may vary among health care providers and hospitals. What happens after the procedure?   Your blood pressure, heart rate, breathing rate, and blood oxygen level will be monitored until the medicines you were given have worn off.  Do not drive for 24 hours if you were given a sedative during your procedure.  It is up to you to get the results of your procedure. Ask your health care provider, or the department that is doing the procedure, when your results will be ready. Summary  Diagnostic laparoscopy is a way to look for problems in the abdomen using small incisions.  Follow instructions from your health care provider about how to prepare for the procedure.  Plan to have a responsible adult care for you for at least 24 hours after you leave the hospital or clinic. This is important. This information is not intended to replace advice given to you by your health care provider. Make sure you discuss any questions you have with your health care provider. Document Released: 07/28/2000 Document Revised: 04/03/2017 Document Reviewed: 10/15/2016 Elsevier Patient Education  2020 Elsevier Inc.  

## 2019-02-12 ENCOUNTER — Encounter: Payer: Self-pay | Admitting: Obstetrics and Gynecology

## 2019-02-14 ENCOUNTER — Telehealth: Payer: Self-pay | Admitting: Obstetrics and Gynecology

## 2019-02-14 ENCOUNTER — Emergency Department: Payer: Medicaid Other

## 2019-02-14 ENCOUNTER — Other Ambulatory Visit: Payer: Self-pay

## 2019-02-14 DIAGNOSIS — Z87891 Personal history of nicotine dependence: Secondary | ICD-10-CM | POA: Insufficient documentation

## 2019-02-14 DIAGNOSIS — R0789 Other chest pain: Secondary | ICD-10-CM | POA: Insufficient documentation

## 2019-02-14 DIAGNOSIS — J45909 Unspecified asthma, uncomplicated: Secondary | ICD-10-CM | POA: Insufficient documentation

## 2019-02-14 LAB — CBC
HCT: 42.5 % (ref 36.0–46.0)
Hemoglobin: 14.3 g/dL (ref 12.0–15.0)
MCH: 30.2 pg (ref 26.0–34.0)
MCHC: 33.6 g/dL (ref 30.0–36.0)
MCV: 89.9 fL (ref 80.0–100.0)
Platelets: 263 10*3/uL (ref 150–400)
RBC: 4.73 MIL/uL (ref 3.87–5.11)
RDW: 12.2 % (ref 11.5–15.5)
WBC: 7.1 10*3/uL (ref 4.0–10.5)
nRBC: 0 % (ref 0.0–0.2)

## 2019-02-14 NOTE — Telephone Encounter (Signed)
Thank you  TH

## 2019-02-14 NOTE — ED Triage Notes (Signed)
Pt in with co generalized chest pain that radiates to back. Worse when she takes a deep breath, denies any recent illness or hx of the same.

## 2019-02-14 NOTE — Telephone Encounter (Signed)
Message was sent to Dr Marcelline Mates regarding surgery date.

## 2019-02-14 NOTE — Telephone Encounter (Signed)
The patient called and stated that she needs to speak with someone to check the status of her surgery being updated or not, Pt also stated that she needs a refill of her pain medication and is requesting a call back. Pain medication is attached to visit info tab. Please advise.

## 2019-02-15 ENCOUNTER — Emergency Department
Admission: EM | Admit: 2019-02-15 | Discharge: 2019-02-15 | Disposition: A | Discharge status: Home / Self Care | Payer: Medicaid Other | Attending: Emergency Medicine | Admitting: Emergency Medicine

## 2019-02-15 ENCOUNTER — Emergency Department: Payer: Medicaid Other

## 2019-02-15 ENCOUNTER — Encounter: Payer: Self-pay | Admitting: Radiology

## 2019-02-15 DIAGNOSIS — R079 Chest pain, unspecified: Secondary | ICD-10-CM

## 2019-02-15 DIAGNOSIS — R0789 Other chest pain: Secondary | ICD-10-CM

## 2019-02-15 LAB — COMPREHENSIVE METABOLIC PANEL
ALT: 12 U/L (ref 0–44)
AST: 15 U/L (ref 15–41)
Albumin: 4.2 g/dL (ref 3.5–5.0)
Alkaline Phosphatase: 60 U/L (ref 38–126)
Anion gap: 7 (ref 5–15)
BUN: 9 mg/dL (ref 6–20)
CO2: 27 mmol/L (ref 22–32)
Calcium: 9.1 mg/dL (ref 8.9–10.3)
Chloride: 107 mmol/L (ref 98–111)
Creatinine, Ser: 0.74 mg/dL (ref 0.44–1.00)
GFR calc Af Amer: >60 mL/min (ref >60)
GFR calc non Af Amer: >60 mL/min (ref >60)
Glucose, Bld: 88 mg/dL (ref 70–99)
Potassium: 3.6 mmol/L (ref 3.5–5.1)
Sodium: 141 mmol/L (ref 135–145)
Total Bilirubin: 0.5 mg/dL (ref 0.3–1.2)
Total Protein: 6.8 g/dL (ref 6.5–8.1)

## 2019-02-15 LAB — TROPONIN I (HIGH SENSITIVITY)
Troponin I (High Sensitivity): <2 ng/L (ref <18)
Troponin I (High Sensitivity): <2 ng/L (ref <18)

## 2019-02-15 MED ORDER — SODIUM CHLORIDE 0.9 % IV BOLUS
1000.0000 mL | Freq: Once | INTRAVENOUS | Status: AC
Start: 2019-02-15 — End: 2019-02-15
  Administered 2019-02-15: 1000 mL via INTRAVENOUS

## 2019-02-15 MED ORDER — OXYCODONE-ACETAMINOPHEN 5-325 MG PO TABS
1.0000 | ORAL_TABLET | Freq: Once | ORAL | Status: AC
  Administered 2019-02-15: 01:00:00 1 via ORAL
  Filled 2019-02-15: qty 1

## 2019-02-15 MED ORDER — DROPERIDOL 2.5 MG/ML IJ SOLN
1.2500 mg | Freq: Once | INTRAMUSCULAR | Status: AC
  Administered 2019-02-15: 1.25 mg via INTRAVENOUS
  Filled 2019-02-15: qty 2

## 2019-02-15 MED ORDER — IOHEXOL 350 MG/ML SOLN
75.0000 mL | Freq: Once | INTRAVENOUS | Status: AC | PRN
  Administered 2019-02-15: 75 mL via INTRAVENOUS

## 2019-02-15 MED ORDER — OXYCODONE-ACETAMINOPHEN 5-325 MG PO TABS: 1.0000 | ORAL_TABLET | ORAL | 0 refills | Status: DC | PRN

## 2019-02-15 MED ORDER — KETOROLAC TROMETHAMINE 30 MG/ML IJ SOLN
10.0000 mg | Freq: Once | INTRAMUSCULAR | Status: AC
  Administered 2019-02-15: 9.9 mg via INTRAVENOUS
  Filled 2019-02-15: qty 1

## 2019-02-15 MED ORDER — IBUPROFEN 600 MG PO TABS: 600.0000 mg | ORAL_TABLET | Freq: Three times a day (TID) | ORAL | 0 refills | Status: DC | PRN

## 2019-02-15 NOTE — Discharge Instructions (Signed)
1.  You may take pain medicines as needed (Motrin/Percocet #15). 2.  Apply moist heat to affected area several times daily. 3.  Return to the ER for worsening symptoms, persistent vomiting, difficulty breathing or other concerns. 

## 2019-02-15 NOTE — ED Provider Notes (Signed)
[facility] Emergency Department Provider Note   ____________________________________________   First MD Initiated Contact with Patient 02/15/19 0018     (approximate)  I have reviewed the triage vital signs and the nursing notes.   HISTORY  Chief Complaint Chest Pain    HPI Melissa Mcmahon is a 28 y.o. female who presents to the ED from home with a chief complaint of chest pain.  Patient reports generalized chest pain that radiates into her back, onset approximately 3 PM while playing board games.  Worse when she takes of breath and when she moves.  Denies cough, shortness of breath, nausea, vomiting or diarrhea.  Being evaluated for chronic right upper quadrant abdominal pain since December with unremarkable work-ups from GI, general surgery and now seeing GYN.  Denies recent travel or trauma.       Past Medical History:  Diagnosis Date   Acid reflux    Asthma    H/O AS A CHILD-NO INHALERS    Endometriosis    Headache    MIGRAINES-RARE   History of hiatal hernia     Patient Active Problem List   Diagnosis Date Noted   Endometriosis 02/17/2018   S/P vaginal hysterectomy 02/02/2018   Symptomatic cholelithiasis 10/02/2017    Past Surgical History:  Procedure Laterality Date   CHOLECYSTECTOMY N/A 10/07/2017   Procedure: LAPAROSCOPIC CHOLECYSTECTOMY;  Surgeon: Ancil Linsey, MD;  Location: [facility] ORS;  Service: General;  Laterality: N/A;   CYSTOSCOPY W/ RETROGRADES Left 02/04/2018   Procedure: CYSTOSCOPY WITH RETROGRADE PYELOGRAM;  Surgeon: Riki Altes, MD;  Location: [facility] ORS;  Service: Urology;  Laterality: Left;   CYSTOSCOPY WITH STENT PLACEMENT Left 02/04/2018   Procedure: CYSTOSCOPY WITH STENT PLACEMENT;  Surgeon: Riki Altes, MD;  Location: [facility] ORS;  Service: Urology;  Laterality: Left;   FEET SURGERY     KNEE ARTHROSCOPY     LAPAROSCOPY N/A 02/08/2015   Procedure: LAPAROSCOPY DIAGNOSTIC;  Surgeon: Vena Austria, MD;  Location: [facility] ORS;  Service: Gynecology;  Laterality: N/A;   TUBAL LIGATION     VAGINAL HYSTERECTOMY Bilateral 02/01/2018   Procedure: HYSTERECTOMY VAGINAL WITH BILATERAL SALPINGECTOMY;  Surgeon: Hildred Laser, MD;  Location: [facility] ORS;  Service: Gynecology;  Laterality: Bilateral;    Prior to Admission medications   Medication Sig Start Date End Date Taking? Authorizing Provider  ibuprofen (ADVIL) 600 MG tablet Take 1 tablet (600 mg total) by mouth every 8 (eight) hours as needed. 02/15/19   Irean Hong, MD  norethindrone (AYGESTIN) 5 MG tablet Take 1 tablet (5 mg total) by mouth daily. Patient not taking: Reported on 02/10/2019 01/19/19   Hildred Laser, MD  Omeprazole 20 MG TBEC Take 20 mg by mouth 2 (two) times daily.     [provider]  ondansetron (ZOFRAN ODT) 4 MG disintegrating tablet Take 1 tablet (4 mg total) by mouth every 8 (eight) hours as needed for nausea or vomiting. 12/27/18   Chesley Noon, MD  oxyCODONE-acetaminophen (PERCOCET/ROXICET) 5-325 MG tablet Take 1 tablet by mouth every 4 (four) hours as needed for severe pain. 02/15/19   Irean Hong, MD  traMADol (ULTRAM) 50 MG tablet Take 1 tablet (50 mg total) by mouth every 6 (six) hours as needed for moderate pain or severe pain (may take up to 2 tablets q 6 hrs). 02/04/19   Hildred Laser, MD    Allergies Penicillins  Family History  Problem Relation Age of Onset   Diabetes Father  Diabetes Maternal Aunt    Healthy Mother    Breast cancer Neg Hx    Ovarian cancer Neg Hx    Colon cancer Neg Hx     Social History Social History   Tobacco Use   Smoking status: Former Smoker    Packs/day: 1.00    Years: 7.00    Pack years: 7.00    Types: E-cigarettes, Cigarettes    Quit date: 06/07/2017    Years since quitting: 1.6   Smokeless tobacco: Current User  Substance Use Topics   Alcohol use: No   Drug use: No    Review of Systems  Constitutional: No fever/chills Eyes: No  visual changes. ENT: No sore throat. Cardiovascular: Positive for chest pain. Respiratory: Denies shortness of breath. Gastrointestinal: No abdominal pain.  No nausea, no vomiting.  No diarrhea.  No constipation. Genitourinary: Negative for dysuria. Musculoskeletal: Negative for back pain. Skin: Negative for rash. Neurological: Negative for headaches, focal weakness or numbness.   ____________________________________________   PHYSICAL EXAM:  VITAL SIGNS: ED Triage Vitals  Enc Vitals Group     BP 02/14/19 2253 (!) 155/96     Pulse Rate 02/14/19 2253 89     Resp 02/14/19 2253 20     Temp 02/14/19 2253 99.1 F (37.3 C)     Temp Source 02/14/19 2253 Oral     SpO2 02/14/19 2253 99 %     Weight 02/14/19 2254 126 lb (57.2 kg)     Height 02/14/19 2254 5\' 3"  (1.6 m)     Head Circumference --      Peak Flow --      Pain Score 02/14/19 2254 9     Pain Loc --      Pain Edu? --      Excl. in GC? --     Constitutional: Alert and oriented. Well appearing and in no acute distress. Eyes: Conjunctivae are normal. PERRL. EOMI. Head: Atraumatic. Nose: No congestion/rhinnorhea. Mouth/Throat: Mucous membranes are moist.  Oropharynx non-erythematous. Neck: No stridor.   Cardiovascular: Normal rate, regular rhythm. Grossly normal heart sounds.  Good peripheral circulation. Respiratory: Normal respiratory effort.  No retractions. Lungs CTAB.  Anterior chest tender to palpation and with movement of her trunk. Gastrointestinal: Soft and nontender. No distention. No abdominal bruits. No CVA tenderness. Musculoskeletal: No lower extremity tenderness nor edema.  No joint effusions. Neurologic:  Normal speech and language. No gross focal neurologic deficits are appreciated. No gait instability. Skin:  Skin is warm, dry and intact. No rash noted. Psychiatric: Mood and affect are normal. Speech and behavior are normal.  ____________________________________________   LABS (all labs ordered are  listed, but only abnormal results are displayed)  Labs Reviewed  CBC  COMPREHENSIVE METABOLIC PANEL  TROPONIN I (HIGH SENSITIVITY)  TROPONIN I (HIGH SENSITIVITY)   ____________________________________________  EKG  ED ECG REPORT I, Citlalic Norlander J, the attending physician, personally viewed and interpreted this ECG.   Date: 02/15/2019  EKG Time: 2300  Rate: 98  Rhythm: normal EKG, normal sinus rhythm  Axis: Normal  Intervals:none  ST&T Change: Nonspecific  ____________________________________________  RADIOLOGY  ED MD interpretation: No acute cardiopulmonary process; CT demonstrated no PE  Official radiology report(s): Dg Chest 2 View  Result Date: 02/14/2019 CLINICAL DATA:  Chest pain EXAM: CHEST - 2 VIEW COMPARISON:  12/27/2018 FINDINGS: The heart size and mediastinal contours are within normal limits. Both lungs are clear. The visualized skeletal structures are unremarkable. IMPRESSION: No active cardiopulmonary disease. Electronically Signed   By:  Donavan Foil M.D.   On: 02/14/2019 23:27   Ct Angio Chest Pe W/cm &/or Wo Cm  Result Date: 02/15/2019 CLINICAL DATA:  28 year old female with chest pain radiating to the back. Pleuritic pain. EXAM: CT ANGIOGRAPHY CHEST WITH CONTRAST TECHNIQUE: Multidetector CT imaging of the chest was performed using the standard protocol during bolus administration of intravenous contrast. Multiplanar CT image reconstructions and MIPs were obtained to evaluate the vascular anatomy. CONTRAST:  52mL OMNIPAQUE IOHEXOL 350 MG/ML SOLN COMPARISON:  Chest radiographs earlier tonight. CT Abdomen and Pelvis 09/10/2018. FINDINGS: Cardiovascular: Good contrast bolus timing in the pulmonary arterial tree. No focal filling defect identified in the pulmonary arteries to suggest acute pulmonary embolism. No calcified coronary artery atherosclerosis is evident. No cardiomegaly or pericardial effusion. Negative visible aorta. Mediastinum/Nodes: Negative.  Lungs/Pleura: Major airways are patent. Lung volumes appear similar to that in May and both lungs appear clear. No pleural effusion. Upper Abdomen: Negative visible liver, spleen, pancreas, adrenal glands and kidneys. Food debris in the stomach today. Musculoskeletal: Negative aside from some thoracic endplate spurring. Review of the MIP images confirms the above findings. IMPRESSION: Negative for acute pulmonary embolus and negative CT appearance of the chest. Electronically Signed   By: Genevie Ann M.D.   On: 02/15/2019 02:08    ____________________________________________   PROCEDURES  Procedure(s) performed (including Critical Care):  Procedures   ____________________________________________   INITIAL IMPRESSION / ASSESSMENT AND PLAN / ED COURSE  As part of my medical decision making, I reviewed the following data within the [place] notes reviewed and incorporated, Labs reviewed, EKG interpreted, Old chart reviewed, Radiograph reviewed, Notes from prior ED visits and Campus Controlled Substance Database     SWETA HALSETH was evaluated in Emergency Department on 02/15/2019 for the symptoms described in the history of present illness. She was evaluated in the context of the global COVID-19 pandemic, which necessitated consideration that the patient might be at risk for infection with the SARS-CoV-2 virus that causes COVID-19. Institutional protocols and algorithms that pertain to the evaluation of patients at risk for COVID-19 are in a state of rapid change based on information released by regulatory bodies including the CDC and federal and state organizations. These policies and algorithms were followed during the patient's care in the ED.    28 year old female who presents with chest pain. Differential diagnosis includes, but is not limited to, ACS, aortic dissection, pulmonary embolism, cardiac tamponade, pneumothorax, pneumonia, pericarditis, myocarditis,  GI-related causes including esophagitis/gastritis, and musculoskeletal chest wall pain.    Initial troponin and EKG unremarkable.  Will repeat times troponin, obtain CTA chest to evaluate for PE.  Administer IV Toradol and Percocet for chest wall pain.   Clinical Course as of Feb 15 231  Tue Feb 15, 2019  0211 Patient requesting something else for pain.  States the Percocet "ain't done nothing".   [JS]  0215 Repeat troponin and CT negative.  Updated patient about test results.  Will discharge home after IV droperidol.  States she ran out of the tramadol Dr. Marcelline Mates gave her.  Will prescribe a limited quantity of Percocet.  Patient is dissatisfied and wishes to be in 0 pain.  I discussed with her that is not a realistic goal as she has had chronic pain for almost a year.  Strict return precautions given.  Patient verbalizes understanding and reluctantly agrees with plan of care.   [JS]    Clinical Course User Index [JS] Paulette Blanch, MD  ____________________________________________   FINAL CLINICAL IMPRESSION(S) / ED DIAGNOSES  Final diagnoses:  Nonspecific chest pain  Chest wall pain     ED Discharge Orders         Ordered    ibuprofen (ADVIL) 600 MG tablet  Every 8 hours PRN     02/15/19 0220    oxyCODONE-acetaminophen (PERCOCET/ROXICET) 5-325 MG tablet  Every 4 hours PRN     02/15/19 0220           Note:  This document was prepared using Dragon voice recognition software and may include unintentional dictation errors.   Irean Hong, MD 02/15/19 [phone]

## 2019-02-16 ENCOUNTER — Telehealth: Payer: Self-pay | Admitting: Obstetrics and Gynecology

## 2019-02-16 NOTE — Telephone Encounter (Signed)
The patient called and stated that she is checking the status of a medication that was to be filled by Dr. Marcelline Mates last week. Pt could never name medication.Marland KitchenMarland KitchenPlease advise.

## 2019-02-16 NOTE — Telephone Encounter (Signed)
Spoke with pt concerning pain and her surgery date. Pt is aware that [facility] was not in the office today due to surgery at hospital. Pt was informed that I would speak with [facility] on tomorrow about her surgery and refilling her pain medication. Pt stated that she was in a lot of pain and really needed more medication and a possible early surgery date.

## 2019-02-17 ENCOUNTER — Other Ambulatory Visit: Payer: Self-pay | Admitting: Obstetrics and Gynecology

## 2019-02-17 ENCOUNTER — Telehealth: Payer: Self-pay

## 2019-02-17 MED ORDER — TRAMADOL HCL 50 MG PO TABS: 50.0000 mg | ORAL_TABLET | Freq: Four times a day (QID) | ORAL | 0 refills | Status: DC | PRN

## 2019-02-17 NOTE — Telephone Encounter (Signed)
Please see another phone encounter.  

## 2019-02-17 NOTE — Telephone Encounter (Signed)
Pt stated that she was given percocet but they make her feel weird and the tramadol works better for the pain. thanks [employer]

## 2019-02-17 NOTE — Telephone Encounter (Signed)
Spoke to patient concerning pain medication and surgery schedule. Pt was informed that her pain medication will be refilled and [place] would be contacting her concerning surgery schedule. Pt stated that she understood.

## 2019-02-21 NOTE — Telephone Encounter (Signed)
Pt called to check on her status due to her being in a lot of pain last week and to see if she picked up her pain medication. Pt stated that she was doing a lot better and picked up her pain medication over the weekend.

## 2019-02-21 NOTE — Telephone Encounter (Signed)
Pt called to see if she

## 2019-02-22 ENCOUNTER — Ambulatory Visit: Payer: Self-pay | Admitting: Obstetrics and Gynecology

## 2019-03-02 ENCOUNTER — Other Ambulatory Visit: Payer: Self-pay

## 2019-03-02 ENCOUNTER — Other Ambulatory Visit: Payer: Self-pay | Admitting: Obstetrics and Gynecology

## 2019-03-02 ENCOUNTER — Encounter
Admission: RE | Admit: 2019-03-02 | Discharge: 2019-03-02 | Disposition: A | Discharge status: Home / Self Care | Payer: Self-pay | Source: Ambulatory Visit | Attending: Obstetrics and Gynecology | Admitting: Obstetrics and Gynecology

## 2019-03-02 NOTE — Progress Notes (Unsigned)
@[place] @   GYNECOLOGY PREOPERATIVE HISTORY AND PHYSICAL   Subjective:  Melissa Mcmahon is a 28 y.o. [phone] here for surgical management of ***.   Indications for procedure include: ***. No significant preoperative concerns.  Proposed surgery: ***    Pertinent Gynecological History: Menses: {menses:16152} Bleeding: {uterine bleeding:32112} Contraception: {contraception:5051} Last mammogram: {normal/abnormal***:32111} Date: *** Last pap: {normal/abnormal***:32111} Date: ***   Past Medical History:  Diagnosis Date  . Acid reflux   . Asthma    H/O AS A CHILD-NO INHALERS   . Endometriosis   . Headache    MIGRAINES-RARE  . History of hiatal hernia    Past Surgical History:  Procedure Laterality Date  . CHOLECYSTECTOMY N/A 10/07/2017   Procedure: LAPAROSCOPIC CHOLECYSTECTOMY;  Surgeon: 12/07/2017, MD;  Location: [facility];  Service: General;  Laterality: N/A;  . CYSTOSCOPY W/ RETROGRADES Left 02/04/2018   Procedure: CYSTOSCOPY WITH RETROGRADE PYELOGRAM;  Surgeon: 04/06/2018, MD;  Location: [facility];  Service: Urology;  Laterality: Left;  . CYSTOSCOPY WITH STENT PLACEMENT Left 02/04/2018   Procedure: CYSTOSCOPY WITH STENT PLACEMENT;  Surgeon: 04/06/2018, MD;  Location: [facility];  Service: Urology;  Laterality: Left;  . FEET SURGERY    . KNEE ARTHROSCOPY    . LAPAROSCOPY N/A 02/08/2015   Procedure: LAPAROSCOPY DIAGNOSTIC;  Surgeon: 04/10/2015, MD;  Location: [facility];  Service: Gynecology;  Laterality: N/A;  . TUBAL LIGATION    . VAGINAL HYSTERECTOMY Bilateral 02/01/2018   Procedure: HYSTERECTOMY VAGINAL WITH BILATERAL SALPINGECTOMY;  Surgeon: 02/03/2018, MD;  Location: [facility];  Service: Gynecology;  Laterality: Bilateral;   OB History  Gravida Para Term Preterm AB Living  2 2 2     2   SAB TAB Ectopic Multiple Live Births          2    # Outcome Date GA Lbr Len/2nd Weight Sex Delivery Anes PTL Lv  2 Term 2014    F Vag-Spont   LIV  1 Term 2012     F Vag-Spont   LIV  Patient denies any other pertinent gynecologic issues.  Family History  Problem Relation Age of Onset  . Diabetes Father   . Diabetes Maternal Aunt   . Healthy Mother   . Breast cancer Neg Hx   . Ovarian cancer Neg Hx   . Colon cancer Neg Hx    Social History   Socioeconomic History  . Marital status: Legally Separated    Spouse name: Not on file  . Number of children: Not on file  . Years of education: Not on file  . Highest education level: Not on file  Occupational History  . Not on file  Social Needs  . Financial resource strain: Not on file  . Food insecurity    Worry: Not on file    Inability: Not on file  . Transportation needs    Medical: Not on file    Non-medical: Not on file  Tobacco Use  . Smoking status: Former Smoker    Packs/day: 1.00    Years: 7.00    Pack years: 7.00    Types: E-cigarettes, Cigarettes    Quit date: 06/07/2017    Years since quitting: 1.7  . Smokeless tobacco: Current User  Substance and Sexual Activity  . Alcohol use: No  . Drug use: No  . Sexual activity: Yes    Birth control/protection: Surgical    Comment: Hysterectomy   Lifestyle  . Physical activity    Days per  week: Not on file    Minutes per session: Not on file  . Stress: Not on file  Relationships  . Social Musicianconnections    Talks on phone: Not on file    Gets together: Not on file    Attends religious service: Not on file    Active member of club or organization: Not on file    Attends meetings of clubs or organizations: Not on file    Relationship status: Not on file  . Intimate partner violence    Fear of current or ex partner: Not on file    Emotionally abused: Not on file    Physically abused: Not on file    Forced sexual activity: Not on file  Other Topics Concern  . Not on file  Social History Narrative  . Not on file   Current Outpatient Medications on File Prior to Visit  Medication Sig Dispense Refill  . acetaminophen (TYLENOL) 500  MG tablet Take 1,000 mg by mouth every 6 (six) hours as needed for moderate pain or headache.    . ibuprofen (ADVIL) 600 MG tablet Take 1 tablet (600 mg total) by mouth every 8 (eight) hours as needed. (Patient not taking: Reported on 02/28/2019) 15 tablet 0  . Omeprazole 20 MG TBEC Take 20 mg by mouth 2 (two) times daily.     . ondansetron (ZOFRAN ODT) 4 MG disintegrating tablet Take 1 tablet (4 mg total) by mouth every 8 (eight) hours as needed for nausea or vomiting. (Patient not taking: Reported on 02/28/2019) 12 tablet 0  . oxyCODONE-acetaminophen (PERCOCET/ROXICET) 5-325 MG tablet Take 1 tablet by mouth every 4 (four) hours as needed for severe pain. (Patient not taking: Reported on 02/28/2019) 15 tablet 0  . traMADol (ULTRAM) 50 MG tablet Take 1 tablet (50 mg total) by mouth every 6 (six) hours as needed for moderate pain or severe pain (may take up to 2 tablets q 6 hrs). (Patient taking differently: Take 50-100 mg by mouth every 6 (six) hours as needed for moderate pain or severe pain. ) 60 tablet 0   No current facility-administered medications on file prior to visit.    Allergies  Allergen Reactions  . Penicillins Hives    Did it involve swelling of the face/tongue/throat, SOB, or low BP? No Did it involve sudden or severe rash/hives, skin peeling, or any reaction on the inside of your mouth or nose? Unknown Did you need to seek medical attention at a hospital or doctor's office? Unknown When did it last happen?childhood allergy If all above answers are "NO", may proceed with cephalosporin use.       Review of Systems Constitutional: No recent fever/chills/sweats Respiratory: No recent cough/bronchitis Cardiovascular: No chest pain Gastrointestinal: No recent nausea/vomiting/diarrhea Genitourinary: No UTI symptoms Hematologic/lymphatic:No history of coagulopathy or recent blood thinner use    Objective:   Last menstrual period 01/11/2018. CONSTITUTIONAL:  Well-developed, well-nourished female in no acute distress.  HENT:  Normocephalic, atraumatic, External right and left ear normal. Oropharynx is clear and moist EYES: Conjunctivae and EOM are normal. Pupils are equal, round, and reactive to light. No scleral icterus.  NECK: Normal range of motion, supple, no masses SKIN: Skin is warm and dry. No rash noted. Not diaphoretic. No erythema. No pallor. NEUROLOGIC: Alert and oriented to person, place, and time. Normal reflexes, muscle tone coordination. No cranial nerve deficit noted. PSYCHIATRIC: Normal mood and affect. Normal behavior. Normal judgment and thought content. CARDIOVASCULAR: Normal heart rate noted, regular rhythm RESPIRATORY:  Effort and breath sounds normal, no problems with respiration noted ABDOMEN: Soft, nontender, nondistended. PELVIC: Deferred MUSCULOSKELETAL: Normal range of motion. No edema and no tenderness. 2+ distal pulses.    Labs: No results found for this or any previous visit (from the past 336 hour(s)).   Imaging Studies: Dg Chest 2 View  Result Date: 02/14/2019 CLINICAL DATA:  Chest pain EXAM: CHEST - 2 VIEW COMPARISON:  12/27/2018 FINDINGS: The heart size and mediastinal contours are within normal limits. Both lungs are clear. The visualized skeletal structures are unremarkable. IMPRESSION: No active cardiopulmonary disease. Electronically Signed   By: Donavan Foil M.D.   On: 02/14/2019 23:27   Ct Angio Chest Pe W/cm &/or Wo Cm  Result Date: 02/15/2019 CLINICAL DATA:  28 year old female with chest pain radiating to the back. Pleuritic pain. EXAM: CT ANGIOGRAPHY CHEST WITH CONTRAST TECHNIQUE: Multidetector CT imaging of the chest was performed using the standard protocol during bolus administration of intravenous contrast. Multiplanar CT image reconstructions and MIPs were obtained to evaluate the vascular anatomy. CONTRAST:  24mL OMNIPAQUE IOHEXOL 350 MG/ML SOLN COMPARISON:  Chest radiographs earlier tonight.  CT Abdomen and Pelvis 09/10/2018. FINDINGS: Cardiovascular: Good contrast bolus timing in the pulmonary arterial tree. No focal filling defect identified in the pulmonary arteries to suggest acute pulmonary embolism. No calcified coronary artery atherosclerosis is evident. No cardiomegaly or pericardial effusion. Negative visible aorta. Mediastinum/Nodes: Negative. Lungs/Pleura: Major airways are patent. Lung volumes appear similar to that in May and both lungs appear clear. No pleural effusion. Upper Abdomen: Negative visible liver, spleen, pancreas, adrenal glands and kidneys. Food debris in the stomach today. Musculoskeletal: Negative aside from some thoracic endplate spurring. Review of the MIP images confirms the above findings. IMPRESSION: Negative for acute pulmonary embolus and negative CT appearance of the chest. Electronically Signed   By: Genevie Ann M.D.   On: 02/15/2019 02:08    Assessment:     ***      Plan:    Counseling: Procedure, risks, reasons, benefits and complications (including injury to bowel, bladder, major blood vessel, ureter, bleeding, possibility of transfusion, infection, or fistula formation) reviewed in detail. Likelihood of success in alleviating the patient's condition was discussed. Routine postoperative instructions will be reviewed with the patient and her family in detail after surgery.  The patient concurred with the proposed plan, giving informed written consent for the surgery.   Preop testing ordered. Instructions reviewed, including NPO after midnight.

## 2019-03-02 NOTE — Patient Instructions (Addendum)
Your COVID test is scheduled on: Thursday 03/03/2019. Drive up in front of [place] any time 8:00-10:30 am and remain in your vehicle.  Your procedure is scheduled on: Monday March 07, 2019 Report to Same Day Surgery 2nd floor [facility] Entrance-take elevator on left to 2nd floor.  Check in with surgery information desk.) To find out your arrival time, call [phone] 1:00-3:00 PM on Friday March 04, 2019  Remember: Instructions that are not followed completely may result in serious medical risk, up to and including death, or upon the discretion of your surgeon and anesthesiologist your surgery may need to be rescheduled.    __x__ 1. Do not eat food (including mints, candies, chewing gum) after midnight the night before your procedure. You may drink clear liquids up to 2 hours before you are scheduled to arrive at the hospital for your procedure.  Do not drink anything within 2 hours of your scheduled arrival to the hospital.  Approved clear liquids:  --Water or Apple juice without pulp  --Clear carbohydrate beverage such as Gatorade or Powerade  --Black Coffee or Clear Tea (No milk, no creamers, do not add anything to the coffee or tea)  Finish the provided Ensure carbohydrate beverage 2 hours before your scheduled arrival time on the day of surgery.    __x__ 2. No Alcohol for 24 hours before or after surgery.   __x__ 3. No Smoking or e-cigarettes for 24 hours before surgery.  Do not use any chewable tobacco products for at least 6 hours before surgery.   __x__ 4. Notify your doctor if there is any change in your medical condition (cold, fever, infections).   __x__ 5. On the morning of surgery brush your teeth with toothpaste and water.  You may rinse your mouth with mouthwash if you wish.  Do not swallow any toothpaste or mouthwash.  Please read over the following fact sheets that you were given:   [facility] Preparing for Surgery and/or MRSA  Information    __x__ Use CHG Soap as directed on instruction sheet.   Do not wear jewelry, make-up, hairpins, clips or nail polish on the day of surgery.  Do not wear lotions, powders, deodorant, or perfumes.   Do not shave below the face/neck 48 hours prior to surgery.   Do not bring valuables to the hospital.    [facility] is not responsible for any belongings or valuables.               Eyeglasses may not be worn into surgery.  For patients admitted to the hospital, discharge time is determined by your treatment team.  For patients discharged on the day of surgery, you will NOT be permitted to drive yourself home.  You must have a responsible adult with you for 24 hours after surgery.  __x__ Take these medicines on the morning of surgery with a SMALL SIP OF WATER:  1. Omeprazole (Prilosec)  2. Tylenol if needed  __x__ Follow recommendations from Cardiologist, Pulmonologist or PCP regarding stopping blood thinners such as Aspirin, Coumadin, Plavix, Eliquis, Effient, Pradaxa, and Pletal.  __x__ STARTING TODAY: Do not take any Anti-inflammatories such as Advil, Ibuprofen, Motrin, Aleve, Naproxen, Naprosyn, BC/Goodies powders or aspirin products. You may continue to take Tylenol.   __x__ STARTING TODAY: Do not take any over the counter supplements until after surgery.  Reviewed instructions with patient and questions answered via telephone interview 03/02/2019 @ 2:15 pm.

## 2019-03-03 ENCOUNTER — Other Ambulatory Visit
Admission: RE | Admit: 2019-03-03 | Discharge: 2019-03-03 | Disposition: A | Discharge status: Home / Self Care | Payer: Medicaid Other | Source: Ambulatory Visit | Attending: Obstetrics and Gynecology | Admitting: Obstetrics and Gynecology

## 2019-03-03 DIAGNOSIS — Z01812 Encounter for preprocedural laboratory examination: Secondary | ICD-10-CM | POA: Insufficient documentation

## 2019-03-03 DIAGNOSIS — Z20828 Contact with and (suspected) exposure to other viral communicable diseases: Secondary | ICD-10-CM | POA: Insufficient documentation

## 2019-03-03 LAB — CBC
HCT: 40.1 % (ref 36.0–46.0)
Hemoglobin: 13.3 g/dL (ref 12.0–15.0)
MCH: 29.6 pg (ref 26.0–34.0)
MCHC: 33.2 g/dL (ref 30.0–36.0)
MCV: 89.3 fL (ref 80.0–100.0)
Platelets: 258 10*3/uL (ref 150–400)
RBC: 4.49 MIL/uL (ref 3.87–5.11)
RDW: 12.6 % (ref 11.5–15.5)
WBC: 6.4 10*3/uL (ref 4.0–10.5)
nRBC: 0 % (ref 0.0–0.2)

## 2019-03-03 LAB — SARS CORONAVIRUS 2 (TAT 6-24 HRS): SARS Coronavirus 2: NEGATIVE

## 2019-03-04 ENCOUNTER — Telehealth: Payer: Self-pay | Admitting: Obstetrics and Gynecology

## 2019-03-04 ENCOUNTER — Other Ambulatory Visit: Payer: Self-pay | Admitting: Obstetrics and Gynecology

## 2019-03-04 MED ORDER — TRAMADOL HCL 50 MG PO TABS: 50.0000 mg | ORAL_TABLET | Freq: Four times a day (QID) | ORAL | 0 refills | Status: DC | PRN

## 2019-03-04 NOTE — Telephone Encounter (Signed)
Refill given. Patient will need f/u appointment for anxiety by next refill in 3 months.

## 2019-03-04 NOTE — Telephone Encounter (Signed)
Pt called no answer LM via VM that I had received her message corncerning wanting a refill of tramadol and that I would send her message to Dr. Marcelline Mates. Also informed pt that I would check the status of her refill before leaving for the day.

## 2019-03-04 NOTE — Telephone Encounter (Signed)
Please see another phone encounter.  

## 2019-03-04 NOTE — Telephone Encounter (Signed)
The patient called again in regards to checking the status of having her medication traMADol (ULTRAM) 50 MG tablet [[id number]] refilled. Pt also stated that she prefers to have the refill sent to [facility] in [place]. Please advise.

## 2019-03-04 NOTE — Telephone Encounter (Signed)
Ignore previous message, that was for a different patient.

## 2019-03-04 NOTE — Telephone Encounter (Signed)
The patient called and requested a refill of her medication traMADol (ULTRAM) 50 MG tablet [[id number]] The patient stated she is out and needs enough to get her to Monday for her surgery. Please advise.

## 2019-03-07 ENCOUNTER — Ambulatory Visit: Payer: Self-pay | Admitting: Anesthesiology

## 2019-03-07 ENCOUNTER — Encounter: Payer: Self-pay | Admitting: *Deleted

## 2019-03-07 ENCOUNTER — Other Ambulatory Visit: Payer: Self-pay

## 2019-03-07 ENCOUNTER — Ambulatory Visit
Admission: RE | Admit: 2019-03-07 | Discharge: 2019-03-07 | Disposition: A | Discharge status: Home / Self Care | Payer: Self-pay | Source: Ambulatory Visit | Attending: Obstetrics and Gynecology | Admitting: Obstetrics and Gynecology

## 2019-03-07 ENCOUNTER — Encounter
Admission: RE | Disposition: A | Discharge status: Home / Self Care | Payer: Self-pay | Source: Ambulatory Visit | Attending: Obstetrics and Gynecology

## 2019-03-07 DIAGNOSIS — R109 Unspecified abdominal pain: Secondary | ICD-10-CM

## 2019-03-07 DIAGNOSIS — G894 Chronic pain syndrome: Secondary | ICD-10-CM

## 2019-03-07 DIAGNOSIS — Z90710 Acquired absence of both cervix and uterus: Secondary | ICD-10-CM | POA: Insufficient documentation

## 2019-03-07 DIAGNOSIS — N802 Endometriosis of fallopian tube: Secondary | ICD-10-CM | POA: Insufficient documentation

## 2019-03-07 DIAGNOSIS — G8929 Other chronic pain: Secondary | ICD-10-CM | POA: Insufficient documentation

## 2019-03-07 DIAGNOSIS — R1011 Right upper quadrant pain: Secondary | ICD-10-CM | POA: Insufficient documentation

## 2019-03-07 DIAGNOSIS — N803 Endometriosis of pelvic peritoneum: Secondary | ICD-10-CM

## 2019-03-07 DIAGNOSIS — K219 Gastro-esophageal reflux disease without esophagitis: Secondary | ICD-10-CM | POA: Insufficient documentation

## 2019-03-07 DIAGNOSIS — Z87891 Personal history of nicotine dependence: Secondary | ICD-10-CM | POA: Insufficient documentation

## 2019-03-07 DIAGNOSIS — K2950 Unspecified chronic gastritis without bleeding: Secondary | ICD-10-CM | POA: Insufficient documentation

## 2019-03-07 DIAGNOSIS — Z9049 Acquired absence of other specified parts of digestive tract: Secondary | ICD-10-CM | POA: Insufficient documentation

## 2019-03-07 DIAGNOSIS — Z79899 Other long term (current) drug therapy: Secondary | ICD-10-CM | POA: Insufficient documentation

## 2019-03-07 DIAGNOSIS — N736 Female pelvic peritoneal adhesions (postinfective): Secondary | ICD-10-CM | POA: Insufficient documentation

## 2019-03-07 HISTORY — PX: LAPAROSCOPY: SHX197

## 2019-03-07 SURGERY — LAPAROSCOPY, DIAGNOSTIC
Anesthesia: General

## 2019-03-07 MED ORDER — LACTATED RINGERS IV SOLN
INTRAVENOUS | Status: DC | PRN
  Administered 2019-03-07: 09:00:00 via INTRAVENOUS

## 2019-03-07 MED ORDER — LIDOCAINE HCL (CARDIAC) PF 100 MG/5ML IV SOSY
PREFILLED_SYRINGE | INTRAVENOUS | Status: DC | PRN
  Administered 2019-03-07: 80 mg via INTRAVENOUS

## 2019-03-07 MED ORDER — DEXAMETHASONE SODIUM PHOSPHATE 10 MG/ML IJ SOLN
INTRAMUSCULAR | Status: DC | PRN
  Administered 2019-03-07: 10 mg via INTRAVENOUS

## 2019-03-07 MED ORDER — OXYCODONE HCL 5 MG/5ML PO SOLN: 5.0000 mg | Freq: Once | ORAL | Status: AC | PRN

## 2019-03-07 MED ORDER — OXYCODONE HCL 5 MG PO TABS
5.0000 mg | ORAL_TABLET | Freq: Once | ORAL | Status: AC | PRN
  Administered 2019-03-07: 5 mg via ORAL

## 2019-03-07 MED ORDER — ACETAMINOPHEN 10 MG/ML IV SOLN
INTRAVENOUS | Status: AC
  Filled 2019-03-07: qty 100

## 2019-03-07 MED ORDER — FENTANYL CITRATE (PF) 100 MCG/2ML IJ SOLN
25.0000 ug | INTRAMUSCULAR | Status: DC | PRN
  Administered 2019-03-07 (×4): 50 ug via INTRAVENOUS

## 2019-03-07 MED ORDER — PROPOFOL 10 MG/ML IV BOLUS
INTRAVENOUS | Status: AC
  Filled 2019-03-07: qty 20

## 2019-03-07 MED ORDER — KETOROLAC TROMETHAMINE 30 MG/ML IJ SOLN
INTRAMUSCULAR | Status: DC | PRN
  Administered 2019-03-07: 30 mg via INTRAVENOUS

## 2019-03-07 MED ORDER — MIDAZOLAM HCL 2 MG/2ML IJ SOLN
INTRAMUSCULAR | Status: AC
  Filled 2019-03-07: qty 2

## 2019-03-07 MED ORDER — ROCURONIUM BROMIDE 100 MG/10ML IV SOLN
INTRAVENOUS | Status: DC | PRN
  Administered 2019-03-07: 10 mg via INTRAVENOUS
  Administered 2019-03-07: 40 mg via INTRAVENOUS

## 2019-03-07 MED ORDER — OXYCODONE HCL 5 MG PO TABS: 5.0000 mg | ORAL_TABLET | Freq: Three times a day (TID) | ORAL | 0 refills | Status: DC | PRN

## 2019-03-07 MED ORDER — MIDAZOLAM HCL 2 MG/2ML IJ SOLN
INTRAMUSCULAR | Status: DC | PRN
  Administered 2019-03-07: 2 mg via INTRAVENOUS

## 2019-03-07 MED ORDER — OXYCODONE HCL 5 MG PO TABS
ORAL_TABLET | ORAL | Status: AC
  Filled 2019-03-07: qty 1

## 2019-03-07 MED ORDER — PROPOFOL 10 MG/ML IV BOLUS
INTRAVENOUS | Status: DC | PRN
  Administered 2019-03-07: 150 mg via INTRAVENOUS

## 2019-03-07 MED ORDER — FENTANYL CITRATE (PF) 100 MCG/2ML IJ SOLN
INTRAMUSCULAR | Status: AC
  Filled 2019-03-07: qty 2

## 2019-03-07 MED ORDER — ONDANSETRON HCL 4 MG/2ML IJ SOLN
INTRAMUSCULAR | Status: DC | PRN
  Administered 2019-03-07: 4 mg via INTRAVENOUS

## 2019-03-07 MED ORDER — FENTANYL CITRATE (PF) 100 MCG/2ML IJ SOLN
INTRAMUSCULAR | Status: AC
  Administered 2019-03-07: 50 ug via INTRAVENOUS
  Filled 2019-03-07: qty 2

## 2019-03-07 MED ORDER — IBUPROFEN 800 MG PO TABS: 800.0000 mg | ORAL_TABLET | Freq: Three times a day (TID) | ORAL | 0 refills | Status: DC | PRN

## 2019-03-07 MED ORDER — SUGAMMADEX SODIUM 200 MG/2ML IV SOLN
INTRAVENOUS | Status: DC | PRN
  Administered 2019-03-07: 150 mg via INTRAVENOUS

## 2019-03-07 MED ORDER — FENTANYL CITRATE (PF) 100 MCG/2ML IJ SOLN
INTRAMUSCULAR | Status: DC | PRN
  Administered 2019-03-07 (×2): 50 ug via INTRAVENOUS

## 2019-03-07 MED ORDER — BUPIVACAINE HCL (PF) 0.5 % IJ SOLN
INTRAMUSCULAR | Status: AC
  Filled 2019-03-07: qty 30

## 2019-03-07 MED ORDER — ACETAMINOPHEN 10 MG/ML IV SOLN
INTRAVENOUS | Status: DC | PRN
  Administered 2019-03-07: 1000 mg via INTRAVENOUS

## 2019-03-07 SURGICAL SUPPLY — 39 items
BLADE SURG SZ11 CARB STEEL (BLADE) ×3 IMPLANT
CANISTER SUCT 1200ML W/VALVE (MISCELLANEOUS) ×3 IMPLANT
CATH ROBINSON RED A/P 16FR (CATHETERS) ×3 IMPLANT
CHLORAPREP W/TINT 26 (MISCELLANEOUS) ×3 IMPLANT
CORD MONOPOLAR M/FML 12FT (MISCELLANEOUS) IMPLANT
COVER WAND RF STERILE (DRAPES) IMPLANT
DERMABOND ADVANCED (GAUZE/BANDAGES/DRESSINGS) ×2
DERMABOND ADVANCED .7 DNX12 (GAUZE/BANDAGES/DRESSINGS) ×1 IMPLANT
GLOVE BIO SURGEON STRL SZ 6.5 (GLOVE) ×2 IMPLANT
GLOVE BIO SURGEON STRL SZ8 (GLOVE) ×3 IMPLANT
GLOVE BIO SURGEONS STRL SZ 6.5 (GLOVE) ×1
GLOVE INDICATOR 7.0 STRL GRN (GLOVE) ×3 IMPLANT
GOWN STRL REUS W/ TWL LRG LVL3 (GOWN DISPOSABLE) ×2 IMPLANT
GOWN STRL REUS W/TWL LRG LVL3 (GOWN DISPOSABLE) ×4
GOWN STRL REUS W/TWL XL LVL4 (GOWN DISPOSABLE) ×3 IMPLANT
GRASPER SUT TROCAR 14GX15 (MISCELLANEOUS) IMPLANT
IRRIGATION STRYKERFLOW (MISCELLANEOUS) ×1 IMPLANT
IRRIGATOR STRYKERFLOW (MISCELLANEOUS) ×3
IV LACTATED RINGERS 1000ML (IV SOLUTION) ×3 IMPLANT
KIT PINK PAD W/HEAD ARE REST (MISCELLANEOUS) ×3
KIT PINK PAD W/HEAD ARM REST (MISCELLANEOUS) ×1 IMPLANT
KIT TURNOVER CYSTO (KITS) ×3 IMPLANT
LIGASURE LAP MARYLAND 5MM 37CM (ELECTROSURGICAL) ×3 IMPLANT
NS IRRIG 500ML POUR BTL (IV SOLUTION) ×3 IMPLANT
PACK GYN LAPAROSCOPIC (MISCELLANEOUS) ×3 IMPLANT
PAD OB MATERNITY 4.3X12.25 (PERSONAL CARE ITEMS) ×3 IMPLANT
PAD PREP 24X41 OB/GYN DISP (PERSONAL CARE ITEMS) ×3 IMPLANT
POUCH ENDO CATCH 10MM SPEC (MISCELLANEOUS) IMPLANT
POUCH SPECIMEN RETRIEVAL 10MM (ENDOMECHANICALS) IMPLANT
SCISSORS METZENBAUM CVD 33 (INSTRUMENTS) IMPLANT
SET TUBE SMOKE EVAC HIGH FLOW (TUBING) ×3 IMPLANT
SHEARS HARMONIC ACE PLUS 36CM (ENDOMECHANICALS) IMPLANT
SLEEVE ENDOPATH XCEL 5M (ENDOMECHANICALS) ×6 IMPLANT
SUT VIC AB 3-0 SH 27 (SUTURE)
SUT VIC AB 3-0 SH 27X BRD (SUTURE) IMPLANT
SUT VICRYL 0 AB UR-6 (SUTURE) ×3 IMPLANT
TROCAR ENDO BLADELESS 11MM (ENDOMECHANICALS) ×3 IMPLANT
TROCAR XCEL NON-BLD 5MMX100MML (ENDOMECHANICALS) ×3 IMPLANT
TROCAR XCEL UNIV SLVE 11M 100M (ENDOMECHANICALS) ×3 IMPLANT

## 2019-03-07 NOTE — Anesthesia Procedure Notes (Signed)
Procedure Name: Intubation Date/Time: 03/07/2019 9:39 AM Performed by: Johnna Acosta, CRNA Pre-anesthesia Checklist: Patient identified, Emergency Drugs available, Suction available, Patient being monitored and Timeout performed Patient Re-evaluated:Patient Re-evaluated prior to induction Oxygen Delivery Method: Circle system utilized Preoxygenation: Pre-oxygenation with 100% oxygen Induction Type: IV induction Ventilation: Mask ventilation without difficulty Laryngoscope Size: Miller and 2 Grade View: Grade I Tube type: Oral Tube size: 7.0 mm Number of attempts: 1 Airway Equipment and Method: Stylet Placement Confirmation: ETT inserted through vocal cords under direct vision,  positive ETCO2 and breath sounds checked- equal and bilateral Secured at: 21 cm Tube secured with: Tape Dental Injury: Teeth and Oropharynx as per pre-operative assessment

## 2019-03-07 NOTE — Anesthesia Postprocedure Evaluation (Signed)
Anesthesia Post Note  Patient: ANAB VIVAR  Procedure(s) Performed: LAPAROSCOPY DIAGNOSTIC (N/A )  Patient location during evaluation: PACU Anesthesia Type: General Level of consciousness: awake and alert Pain management: pain level controlled Vital Signs Assessment: post-procedure vital signs reviewed and stable Respiratory status: spontaneous breathing, nonlabored ventilation, respiratory function stable and patient connected to nasal cannula oxygen Cardiovascular status: blood pressure returned to baseline and stable Postop Assessment: no apparent nausea or vomiting Anesthetic complications: no     Last Vitals:  Vitals:   03/07/19 1148 03/07/19 1200  BP: 127/90 131/77  Pulse: (!) 56 (!) 58  Resp: 11 16  Temp: (!) 36.4 C 36.6 C  SpO2: 100% 100%    Last Pain:  Vitals:   03/07/19 1200  TempSrc:   PainSc: 4                  Precious Haws Kohner Orlick

## 2019-03-07 NOTE — Anesthesia Preprocedure Evaluation (Signed)
Anesthesia Evaluation  Patient identified by MRN, date of birth, ID band Patient awake    Reviewed: Allergy & Precautions, H&P , NPO status , Patient's Chart, lab work & pertinent test results  History of Anesthesia Complications Negative for: history of anesthetic complications  Airway Mallampati: II  TM Distance: >3 FB Neck ROM: full    Dental  (+) Chipped   Pulmonary neg shortness of breath, asthma , Patient abstained from smoking., former smoker,           Cardiovascular Exercise Tolerance: Good (-) angina(-) Past MI and (-) DOE negative cardio ROS       Neuro/Psych  Headaches, negative psych ROS   GI/Hepatic Neg liver ROS, hiatal hernia, GERD  Medicated and Controlled,  Endo/Other  negative endocrine ROS  Renal/GU      Musculoskeletal   Abdominal   Peds  Hematology negative hematology ROS (+)   Anesthesia Other Findings Past Medical History: No date: Acid reflux No date: Asthma     Comment:  H/O AS A CHILD-NO INHALERS  No date: Endometriosis No date: Headache     Comment:  MIGRAINES-RARE No date: History of hiatal hernia  Past Surgical History: 10/07/2017: CHOLECYSTECTOMY; N/A     Comment:  Procedure: LAPAROSCOPIC CHOLECYSTECTOMY;  Surgeon:               Vickie Epley, MD;  Location: [facility];  Service:               General;  Laterality: N/A; 02/04/2018: CYSTOSCOPY W/ RETROGRADES; Left     Comment:  Procedure: CYSTOSCOPY WITH RETROGRADE PYELOGRAM;                Surgeon: Abbie Sons, MD;  Location: [facility];                Service: Urology;  Laterality: Left; 02/04/2018: [place]; Left     Comment:  Procedure: CYSTOSCOPY WITH STENT PLACEMENT;  Surgeon:               Abbie Sons, MD;  Location: [facility];  Service:               Urology;  Laterality: Left; No date: FEET SURGERY No date: KNEE ARTHROSCOPY 02/08/2015: LAPAROSCOPY; N/A     Comment:  Procedure:  LAPAROSCOPY DIAGNOSTIC;  Surgeon: Malachy Mood, MD;  Location: [facility];  Service: Gynecology;               Laterality: N/A; No date: TUBAL LIGATION 02/01/2018: VAGINAL HYSTERECTOMY; Bilateral     Comment:  Procedure: HYSTERECTOMY VAGINAL WITH BILATERAL               SALPINGECTOMY;  Surgeon: Rubie Maid, MD;  Location:               [facility];  Service: Gynecology;  Laterality: Bilateral;  BMI    Body Mass Index: 22.32 kg/m      Reproductive/Obstetrics negative OB ROS                             Anesthesia Physical Anesthesia Plan  ASA: III  Anesthesia Plan: General ETT   Post-op Pain Management:    Induction: Intravenous  PONV Risk Score and Plan: Ondansetron, Dexamethasone, Midazolam and Treatment may vary due to age or medical condition  Airway Management Planned: Oral  ETT  Additional Equipment:   Intra-op Plan:   Post-operative Plan: Extubation in OR  Informed Consent: I have reviewed the patients History and Physical, chart, labs and discussed the procedure including the risks, benefits and alternatives for the proposed anesthesia with the patient or authorized representative who has indicated his/her understanding and acceptance.     Dental Advisory Given  Plan Discussed with: Anesthesiologist, CRNA and Surgeon  Anesthesia Plan Comments: (Patient consented for risks of anesthesia including but not limited to:  - adverse reactions to medications - damage to teeth, lips or other oral mucosa - sore throat or hoarseness - Damage to heart, brain, lungs or loss of life  Patient voiced understanding.)        Anesthesia Quick Evaluation

## 2019-03-07 NOTE — Transfer of Care (Signed)
Immediate Anesthesia Transfer of Care Note  Patient: Melissa Mcmahon  Procedure(s) Performed: LAPAROSCOPY DIAGNOSTIC (N/A )  Patient Location: PACU  Anesthesia Type:General  Level of Consciousness: awake and drowsy  Airway & Oxygen Therapy: Patient Spontanous Breathing and Patient connected to face mask oxygen  Post-op Assessment: Report given to RN and Post -op Vital signs reviewed and stable  Post vital signs: Reviewed and stable  Last Vitals:  Vitals Value Taken Time  BP 117/75 03/07/19 1103  Temp 36.5 C 03/07/19 1103  Pulse 72 03/07/19 1104  Resp 25 03/07/19 1105  SpO2 100 % 03/07/19 1104  Vitals shown include unvalidated device data.  Last Pain:  Vitals:   03/07/19 0808  TempSrc: Tympanic  PainSc: 4          Complications: No apparent anesthesia complications

## 2019-03-07 NOTE — Anesthesia Post-op Follow-up Note (Signed)
Anesthesia QCDR form completed.        

## 2019-03-07 NOTE — Interval H&P Note (Signed)
History and Physical Interval Note:  03/07/2019 9:30 AM  Melissa Mcmahon  has presented today for surgery, with the diagnosis of CHRONIC RUQ PAIN, CHRONIC GASTRITIS WITHOUT BLEEDING, HISTORY OF ENDOMETRIOSIS.  The various methods of treatment have been discussed with the patient and family. After consideration of risks, benefits and other options for treatment, the patient has consented to  Procedure(s): LAPAROSCOPY DIAGNOSTIC (N/A) as a surgical intervention.  The patient's history has been reviewed, patient examined, no change in status, stable for surgery.  I have reviewed the patient's chart and labs.  Questions were answered to the patient's satisfaction.     Rubie Maid, MD [facility]

## 2019-03-07 NOTE — Discharge Instructions (Signed)
Diagnostic Laparoscopy, Care After °This sheet gives you information about how to care for yourself after your procedure. Your health care provider may also give you more specific instructions. If you have problems or questions, contact your health care provider. °What can I expect after the procedure? °After the procedure, it is common to have: °· Mild discomfort in the abdomen. °· Sore throat. °Women who have laparoscopy with pelvic examination may have mild cramping and fluid coming from the vagina for a few days after the procedure. °Follow these instructions at home: °Medicines °· Take over-the-counter and prescription medicines only as told by your health care provider. °· If you were prescribed an antibiotic medicine, take it as told by your health care provider. Do not stop taking the antibiotic even if you start to feel better. °Driving °· Do not drive for 24 hours if you were given a medicine to help you relax (sedative) during your procedure. °· Do not drive or use heavy machinery while taking prescription pain medicine. °Bathing °· Do not take baths, swim, or use a hot tub until your health care provider approves. You may take showers. °Incision care ° °· Follow instructions from your health care provider about how to take care of your incisions. Make sure you: °? Wash your hands with soap and water before you change your bandage (dressing). If soap and water are not available, use hand sanitizer. °? Change your dressing as told by your health care provider. °? Leave stitches (sutures), skin glue, or adhesive strips in place. These skin closures may need to stay in place for 2 weeks or longer. If adhesive strip edges start to loosen and curl up, you may trim the loose edges. Do not remove adhesive strips completely unless your health care provider tells you to do that. °· Check your incision areas every day for signs of infection. Check for: °? Redness, swelling, or pain. °? Fluid or  blood. °? Warmth. °? Pus or a bad smell. °Activity °· Return to your normal activities as told by your health care provider. Ask your health care provider what activities are safe for you. °· Do not lift anything that is heavier than 10 lb (4.5 kg), or the limit that you are told, until your health care provider says that it is safe. °General instructions °· To prevent or treat constipation while you are taking prescription pain medicine, your health care provider may recommend that you: °? Drink enough fluid to keep your urine pale yellow. °? Take over-the-counter or prescription medicines. °? Eat foods that are high in fiber, such as fresh fruits and vegetables, whole grains, and beans. °? Limit foods that are high in fat and processed sugars, such as fried and sweet foods. °· Do not use any products that contain nicotine or tobacco, such as cigarettes and e-cigarettes. If you need help quitting, ask your health care provider. °· Keep all follow-up visits as told by your health care provider. This is important. °Contact a health care provider if: °· You develop shoulder pain. °· You feel lightheaded or faint. °· You are unable to pass gas or have a bowel movement. °· You feel nauseous or you vomit. °· You develop a rash. °· You have redness, swelling, or pain around any incision. °· You have fluid or blood coming from any incision. °· Any incision feels warm to the touch. °· You have pus or a bad smell coming from any incision. °· You have a fever or chills. °Get help   right away if:  You have severe pain.  You have vomiting that does not go away.  You have heavy bleeding from the vagina.  Any incision opens.  You have trouble breathing.  You have chest pain. Summary  After the procedure, it is common to have mild discomfort in the abdomen and a sore throat.  Check your incision areas every day for signs of infection.  Return to your normal activities as told by your health care provider. Ask  your health care provider what activities are safe for you. This information is not intended to replace advice given to you by your health care provider. Make sure you discuss any questions you have with your health care provider. Document Released: 04/02/2015 Document Revised: 04/03/2017 Document Reviewed: 10/15/2016 Elsevier Patient Education  2020 [place]   1) The drugs that you were given will stay in your system until tomorrow so for the next 24 hours you should not:  A) Drive an automobile B) Make any legal decisions C) Drink any alcoholic beverage   2) You may resume regular meals tomorrow.  Today it is better to start with liquids and gradually work up to solid foods.  You may eat anything you prefer, but it is better to start with liquids, then soup and crackers, and gradually work up to solid foods.   3) Please notify your doctor immediately if you have any unusual bleeding, trouble breathing, redness and pain at the surgery site, drainage, fever, or pain not relieved by medication.   Please contact your physician with any problems or Same Day Surgery at [phone], Monday through Friday 6 am to 4 pm, or [place] at [facility] number at [phone].
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
## 2019-03-08 ENCOUNTER — Encounter: Payer: Self-pay | Admitting: Obstetrics and Gynecology

## 2019-03-08 LAB — SURGICAL PATHOLOGY

## 2019-03-16 ENCOUNTER — Ambulatory Visit (INDEPENDENT_AMBULATORY_CARE_PROVIDER_SITE_OTHER): Payer: Self-pay | Admitting: Obstetrics and Gynecology

## 2019-03-16 ENCOUNTER — Encounter: Payer: Self-pay | Admitting: Obstetrics and Gynecology

## 2019-03-16 ENCOUNTER — Other Ambulatory Visit: Payer: Self-pay

## 2019-03-16 VITALS — BP 135/89 | HR 64 | Ht 63.0 in | Wt 130.2 lb

## 2019-03-16 DIAGNOSIS — N809 Endometriosis, unspecified: Secondary | ICD-10-CM

## 2019-03-16 DIAGNOSIS — R1010 Upper abdominal pain, unspecified: Secondary | ICD-10-CM

## 2019-03-16 DIAGNOSIS — G8929 Other chronic pain: Secondary | ICD-10-CM

## 2019-03-16 DIAGNOSIS — M546 Pain in thoracic spine: Secondary | ICD-10-CM

## 2019-03-16 MED ORDER — ETONOGESTREL-ETHINYL ESTRADIOL 0.12-0.015 MG/24HR VA RING: VAGINAL_RING | VAGINAL | 11 refills | Status: DC
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
## 2019-03-16 NOTE — Progress Notes (Signed)
Pt present for post op surgery check after laparoscopy. Pt stated that she was doing well other than being sore on the right side.

## 2019-04-06 ENCOUNTER — Ambulatory Visit: Payer: Medicaid Other | Admitting: Physical Therapy

## 2019-04-12 ENCOUNTER — Other Ambulatory Visit: Payer: Self-pay

## 2019-04-12 ENCOUNTER — Ambulatory Visit (INDEPENDENT_AMBULATORY_CARE_PROVIDER_SITE_OTHER): Payer: Medicaid Other | Admitting: Obstetrics and Gynecology

## 2019-04-12 ENCOUNTER — Encounter: Payer: Self-pay | Admitting: Obstetrics and Gynecology

## 2019-04-12 VITALS — BP 132/82 | HR 73 | Ht 63.0 in | Wt 128.8 lb

## 2019-04-12 DIAGNOSIS — Z8742 Personal history of other diseases of the female genital tract: Secondary | ICD-10-CM

## 2019-04-12 DIAGNOSIS — Z3042 Encounter for surveillance of injectable contraceptive: Secondary | ICD-10-CM

## 2019-04-12 DIAGNOSIS — N6011 Diffuse cystic mastopathy of right breast: Secondary | ICD-10-CM

## 2019-04-12 MED ORDER — MEDROXYPROGESTERONE ACETATE 150 MG/ML IM SUSP
150.0000 mg | Freq: Once | INTRAMUSCULAR | Status: AC
  Administered 2019-04-12: 150 mg via INTRAMUSCULAR

## 2019-04-12 NOTE — Progress Notes (Signed)
Pt present due to having breast lump. Pt stated that she noticed a lump and pain in her right breast about 2 weeks ago. Depo injection administered today.

## 2019-04-12 NOTE — Patient Instructions (Signed)
Fibrocystic Breast Changes  Fibrocystic breast changes are changes in breast tissue that can cause breasts to become swollen, lumpy, or painful. This can happen due to buildup of scar-like tissue (fibrous tissue) or the forming of fluid-filled lumps (cysts) in the breast. This is a common condition, and it is not cancerous (is benign). The exact cause is not known, but it seems to occur when women go through hormonal changes during their menstrual cycle. Fibrocystic breast changes can affect one or both breasts. What are the causes? The exact cause of fibrocystic breast changes is not known. However, this condition:  May be related to the female hormones estrogen and progesterone.  May be influenced by family traits that get passed from parent to child (genetics). What are the signs or symptoms? Symptoms of this condition may affect one or both breasts, and may include:  Tenderness, mild discomfort, or pain.  Swelling.  Rope-like tissue that can be felt when touching the breast.  Lumps in one or both breasts.  Changes in breast size. Breasts may get larger before the menstrual period and smaller after the menstrual period.  Green or dark brown discharge from the nipple. Symptoms are usually worse before menstrual periods start, and they get better toward the end of menstrual periods. How is this diagnosed? This condition is diagnosed based on your medical history and a physical exam of your breasts. You may also have tests, such as:  A breast X-ray (mammogram).  Ultrasound of your breasts.  MRI.  Removal of a breast tissue sample for testing (breast biopsy). This may be done if your health care provider thinks that something else may be causing changes in your breasts. How is this treated? Often, treatment is not needed for this condition. In some cases, treatment may include:  Taking over-the-counter pain relievers to help lessen pain or discomfort.  Limiting or avoiding  caffeine. Foods and beverages that contain caffeine include chocolate, soda, coffee, and tea.  Reducing sugar and fat in your diet. Your health care provider may also recommend:  A procedure to remove fluid from a cyst that is causing pain (fine needle aspiration).  Surgery to remove a cyst that is large or tender or does not go away. Follow these instructions at home:  Examine your breasts after every menstrual period. If you do not have menstrual periods, check your breasts on the first day of every month. Feel for changes in your breasts, such as: ? More tenderness. ? A new growth. ? A change in size. ? A change in an existing lump.  Take over-the-counter and prescription medicines only as told by your health care provider.  Wear a well-fitted support or sports bra, especially when exercising.  Decrease or avoid caffeine, fat, and sugar in your diet as directed by your health care provider. Contact a health care provider if:  You have fluid leaking from your nipple, especially if it is bloody.  You have new lumps or bumps in your breast.  Your breast becomes enlarged, red, and painful.  You have areas of your breast that pucker inward.  Your nipple appears flat or indented. Get help right away if:  You have redness of your breast and the redness is spreading. Summary  Fibrocystic breast changes are changes in breast tissue that can cause breasts to become swollen, lumpy, or painful.  This condition may be related to the female hormones estrogen and progesterone.  With this condition, it is important to examine your breasts after every   menstrual period. If you do not have menstrual periods, check your breasts on the first day of every month. This information is not intended to replace advice given to you by your health care provider. Make sure you discuss any questions you have with your health care provider. Document Released: 02/05/2006 Document Revised: 04/03/2017  Document Reviewed: 12/19/2015 Elsevier Patient Education  2020 [employer]  

## 2019-04-12 NOTE — Progress Notes (Signed)
   GYNECOLOGY CLINIC PROGRESS NOTE  Subjective:     Melissa Mcmahon is an 28 y.o. female who presents for evaluation of a breast mass. Change was noted 2 weeks ago, and has been unchanged since first identified. Patient does routinely do self breast exams. The mass was initially tender, but is no longer tender. Patient denies nipple discharge. Breast cancer risk factors include: none.  Of note, patient desires to know what other options she can use for management/suppression of her endometriosis. Has not yet begun the contraceptive patches.  Wonders if there may be a cheaper option. Also notes that she attempted to be seen at physical therapy to aid with her abdominal pain, however her insurance did not cover it.   The following portions of the patient's history were reviewed and updated as appropriate: allergies, current medications, past family history, past medical history, past social history, past surgical history and problem list.  Review of Systems Pertinent items are noted in HPI.     Objective:    BP 132/82   Pulse 73   Ht 5\' 3"  (1.6 m)   Wt 128 lb 12.8 oz (58.4 kg)   LMP 01/11/2018 (Exact Date) Comment: tubal ligation  BMI 22.82 kg/m  General appearance: alert and no distress Breasts: No nipple retraction or dimpling, No nipple discharge or bleeding, negative findings: normal in size and symmetry, skin normal, no palpable axillary lymphadenopathy, positive findings: fibrocystic changes mostly in right breast.   Assessment:    Fibrocystic changes   History of endometriosis  Plan:   - Discussed fibrocystic disease and alleviating measures. - Discussed other options for management of endometriosis including low-dose OCPs (patient had intolerance to higher doses, as well as to Aygestin), contraceptive patch, Depo Provera.  Patient has tried [place] in the past with side effects. Patient notes that she tried Depo in the past, did not have side effects but did have abnormal  bleeding. Since she has had a hysterectomy, is willing to try again. Given first dose today. Will return in 3 months for next injection and f/u of symptoms.    Rubie Maid, MD [facility]

## 2019-04-13 ENCOUNTER — Encounter: Payer: Self-pay | Admitting: Obstetrics and Gynecology

## 2019-04-13 MED ORDER — MEDROXYPROGESTERONE ACETATE 150 MG/ML IM SUSP: 150.0000 mg | INTRAMUSCULAR | 3 refills | Status: DC

## 2019-04-20 ENCOUNTER — Encounter: Payer: Medicaid Other | Admitting: Physical Therapy

## 2019-04-27 ENCOUNTER — Encounter: Payer: Medicaid Other | Admitting: Physical Therapy

## 2019-05-04 ENCOUNTER — Encounter: Payer: Medicaid Other | Admitting: Physical Therapy

## 2019-05-11 ENCOUNTER — Encounter: Payer: Medicaid Other | Admitting: Physical Therapy

## 2019-06-17 ENCOUNTER — Encounter: Payer: Medicaid Other | Admitting: Obstetrics and Gynecology

## 2019-06-22 ENCOUNTER — Ambulatory Visit
Admission: EM | Admit: 2019-06-22 | Discharge: 2019-06-22 | Disposition: A | Discharge status: Home / Self Care | Payer: Self-pay | Attending: Family Medicine | Admitting: Family Medicine

## 2019-06-22 ENCOUNTER — Encounter: Payer: Self-pay | Admitting: Emergency Medicine

## 2019-06-22 ENCOUNTER — Other Ambulatory Visit: Payer: Self-pay

## 2019-06-22 DIAGNOSIS — X500XXA Overexertion from strenuous movement or load, initial encounter: Secondary | ICD-10-CM

## 2019-06-22 DIAGNOSIS — M5441 Lumbago with sciatica, right side: Secondary | ICD-10-CM

## 2019-06-22 MED ORDER — CYCLOBENZAPRINE HCL 5 MG PO TABS: 5.0000 mg | ORAL_TABLET | Freq: Three times a day (TID) | ORAL | 0 refills | Status: AC | PRN

## 2019-06-22 MED ORDER — NAPROXEN 500 MG PO TABS: 500.0000 mg | ORAL_TABLET | Freq: Two times a day (BID) | ORAL | 0 refills | Status: AC

## 2019-06-22 MED ORDER — KETOROLAC TROMETHAMINE 60 MG/2ML IM SOLN
60.0000 mg | Freq: Once | INTRAMUSCULAR | Status: AC
  Administered 2019-06-22: 60 mg via INTRAMUSCULAR

## 2019-06-22 MED ORDER — METHYLPREDNISOLONE 4 MG PO TBPK: ORAL_TABLET | ORAL | 0 refills | Status: AC

## 2019-06-22 NOTE — Discharge Instructions (Signed)
Light and regular activity as tolerated.  See exercises provided.  Heat application while active can help with muscle spasms.  Sleep with pillow under your knees.   Complete medrol dosepak to help with pain, once this is completed may use naproxen twice a day as needed for pain, take with food.  Flexeril every 8hours as needed as a muscle relaxer. May be helpful at night predominately. May cause drowsiness. Please do not take if driving or drinking alcohol.

## 2019-06-22 NOTE — ED Triage Notes (Signed)
Patient states she hurt her low back yesterday taking trash out. She states she went to work this morning and was unable to work because of the pain.

## 2019-06-22 NOTE — ED Provider Notes (Signed)
[facility]    CSN: [id number] Arrival date & time: 06/22/19  1015      History   Chief Complaint Chief Complaint  Patient presents with  . Back Pain    HPI Melissa Mcmahon is a 29 y.o. female.   Melissa Mcmahon presents with complaints of right sided low back pain which started last night after she had done heavy lifting. There wasn't a specific injury. Pain worse with bending, twisting or position transitions. She went to work this morning at [employer] which seemed to worsen her pain. Radiates down right leg. No numbness or tingling. No bowel or bladder incontinence, no saddle symptoms. Pain 6/10 at rest and 10/10 when it is triggered with activity. Took tylenol and ibuprofen last night which didn't help. Denies  Any previous similar back injury.     ROS per HPI, negative if not otherwise mentioned.      Past Medical History:  Diagnosis Date  . Acid reflux   . Asthma    H/O AS A CHILD-NO INHALERS   . Endometriosis   . Headache    MIGRAINES-RARE  . History of hiatal hernia     Patient Active Problem List   Diagnosis Date Noted  . Endometriosis 02/17/2018  . S/P vaginal hysterectomy 02/02/2018  . Symptomatic cholelithiasis 10/02/2017    Past Surgical History:  Procedure Laterality Date  . ABDOMINAL HYSTERECTOMY    . CHOLECYSTECTOMY N/A 10/07/2017   Procedure: LAPAROSCOPIC CHOLECYSTECTOMY;  Surgeon: Ancil Linsey, MD;  Location: [facility];  Service: General;  Laterality: N/A;  . CYSTOSCOPY W/ RETROGRADES Left 02/04/2018   Procedure: CYSTOSCOPY WITH RETROGRADE PYELOGRAM;  Surgeon: Riki Altes, MD;  Location: [facility];  Service: Urology;  Laterality: Left;  . CYSTOSCOPY WITH STENT PLACEMENT Left 02/04/2018   Procedure: CYSTOSCOPY WITH STENT PLACEMENT;  Surgeon: Riki Altes, MD;  Location: [facility];  Service: Urology;  Laterality: Left;  . FEET SURGERY    . KNEE ARTHROSCOPY    . LAPAROSCOPY N/A 02/08/2015   Procedure: LAPAROSCOPY  DIAGNOSTIC;  Surgeon: Vena Austria, MD;  Location: [facility];  Service: Gynecology;  Laterality: N/A;  . LAPAROSCOPY N/A 03/07/2019   Procedure: LAPAROSCOPY DIAGNOSTIC;  Surgeon: Hildred Laser, MD;  Location: [facility];  Service: Gynecology;  Laterality: N/A;  . TUBAL LIGATION    . VAGINAL HYSTERECTOMY Bilateral 02/01/2018   Procedure: HYSTERECTOMY VAGINAL WITH BILATERAL SALPINGECTOMY;  Surgeon: Hildred Laser, MD;  Location: [facility];  Service: Gynecology;  Laterality: Bilateral;    OB History    Gravida  2   Para  2   Term  2   Preterm      AB      Living  2     SAB      TAB      Ectopic      Multiple      Live Births  2            Home Medications    Prior to Admission medications   Medication Sig Start Date End Date Taking? Authorizing Provider  Omeprazole 20 MG TBEC Take 20 mg by mouth 2 (two) times daily.    Yes [provider]  cyclobenzaprine (FLEXERIL) 5 MG tablet Take 1 tablet (5 mg total) by mouth 3 (three) times daily as needed for muscle spasms. 06/22/19   Georgetta Haber, NP  methylPREDNISolone (MEDROL DOSEPAK) 4 MG TBPK tablet Per box instruct 06/22/19   Georgetta Haber, NP  naproxen (  NAPROSYN) 500 MG tablet Take 1 tablet (500 mg total) by mouth 2 (two) times daily. 06/22/19   Georgetta Haber, NP  medroxyPROGESTERone (DEPO-PROVERA) 150 MG/ML injection Inject 1 mL (150 mg total) into the muscle every 3 (three) months. 04/13/19 06/22/19  Hildred Laser, MD    Family History Family History  Problem Relation Age of Onset  . Diabetes Father   . Diabetes Maternal Aunt   . Healthy Mother   . Breast cancer Neg Hx   . Ovarian cancer Neg Hx   . Colon cancer Neg Hx     Social History Social History   Tobacco Use  . Smoking status: Former Smoker    Packs/day: 1.00    Years: 7.00    Pack years: 7.00    Types: E-cigarettes, Cigarettes    Quit date: 06/07/2017    Years since quitting: 2.0  . Smokeless tobacco: Former Engineer, water Use  Topics  . Alcohol use: No  . Drug use: No     Allergies   Penicillins   Review of Systems Review of Systems   Physical Exam Triage Vital Signs ED Triage Vitals  Enc Vitals Group     BP 06/22/19 1027 (!) 132/92     Pulse Rate 06/22/19 1027 96     Resp 06/22/19 1027 18     Temp 06/22/19 1027 98.5 F (36.9 C)     Temp Source 06/22/19 1027 Oral     SpO2 06/22/19 1027 100 %     Weight 06/22/19 1026 125 lb (56.7 kg)     Height 06/22/19 1026 5\' 3"  (1.6 m)     Head Circumference --      Peak Flow --      Pain Score 06/22/19 1026 6     Pain Loc --      Pain Edu? --      Excl. in GC? --    No data found.  Updated Vital Signs BP (!) 132/92 (BP Location: Right Arm)   Pulse 96   Temp 98.5 F (36.9 C) (Oral)   Resp 18   Ht 5\' 3"  (1.6 m)   Wt 125 lb (56.7 kg)   LMP 01/11/2018 (Exact Date) Comment: tubal ligation  SpO2 100%   BMI 22.14 kg/m    Physical Exam Constitutional:      General: She is not in acute distress.    Appearance: She is well-developed.  Cardiovascular:     Rate and Rhythm: Normal rate.  Pulmonary:     Effort: Pulmonary effort is normal.  Musculoskeletal:     Lumbar back: Tenderness present. No signs of trauma or bony tenderness. Decreased range of motion. Positive right straight leg raise test. Negative left straight leg raise test.     Comments: Noted pain with back extension; pain with transition from sit to lay and lay to sit; strength equal bilaterally; gross sensation intact to lower extremities   Skin:    General: Skin is warm and dry.  Neurological:     Mental Status: She is alert and oriented to person, place, and time.      UC Treatments / Results  Labs (all labs ordered are listed, but only abnormal results are displayed) Labs Reviewed - No data to display  EKG   Radiology No results found.  Procedures Procedures (including critical care time)  Medications Ordered in UC Medications  ketorolac (TORADOL) injection 60 mg  (60 mg Intramuscular Given 06/22/19 1103)    Initial Impression /  Assessment and Plan / UC Course  I have reviewed the triage vital signs and the nursing notes.  Pertinent labs & imaging results that were available during my care of the patient were reviewed by me and considered in my medical decision making (see chart for details).     No red flag findings on exam, no neurological findings. Low back pain after heavy lifting. Pain management discussed, as well as prevention. Return precautions provided. Patient verbalized understanding and agreeable to plan.   Final Clinical Impressions(s) / UC Diagnoses   Final diagnoses:  Acute right-sided low back pain with right-sided sciatica     Discharge Instructions     Light and regular activity as tolerated.  See exercises provided.  Heat application while active can help with muscle spasms.  Sleep with pillow under your knees.   Complete medrol dosepak to help with pain, once this is completed may use naproxen twice a day as needed for pain, take with food.  Flexeril every 8hours as needed as a muscle relaxer. May be helpful at night predominately. May cause drowsiness. Please do not take if driving or drinking alcohol.     ED Prescriptions    Medication Sig Dispense Auth. Provider   methylPREDNISolone (MEDROL DOSEPAK) 4 MG TBPK tablet Per box instruct 1 each Ryen Rhames B, NP   naproxen (NAPROSYN) 500 MG tablet Take 1 tablet (500 mg total) by mouth 2 (two) times daily. 30 tablet Augusto Gamble B, NP   cyclobenzaprine (FLEXERIL) 5 MG tablet Take 1 tablet (5 mg total) by mouth 3 (three) times daily as needed for muscle spasms. 15 tablet Zigmund Gottron, NP     PDMP not reviewed this encounter.   Zigmund Gottron, NP 06/22/19 1124

## 2019-07-12 ENCOUNTER — Telehealth: Payer: Self-pay | Admitting: Obstetrics and Gynecology

## 2019-07-12 NOTE — Telephone Encounter (Signed)
Called patient to see if she would like to reschedule an appointment she had missed with [place]. I was unsuccessful reaching patient and was unable to leave a voicemail for patient to return my call.

## 2021-01-22 ENCOUNTER — Encounter: Payer: Self-pay | Admitting: General Surgery
